# Patient Record
Sex: Female | Born: 1950
Health system: Southern US, Community
[De-identification: ages and names within clinical notes are randomized; demographics above are authoritative.]

## PROBLEM LIST (undated history)

## (undated) DIAGNOSIS — G8929 Other chronic pain: Secondary | ICD-10-CM

## (undated) DIAGNOSIS — J45909 Unspecified asthma, uncomplicated: Secondary | ICD-10-CM

## (undated) DIAGNOSIS — I10 Essential (primary) hypertension: Secondary | ICD-10-CM

## (undated) DIAGNOSIS — K635 Polyp of colon: Secondary | ICD-10-CM

## (undated) DIAGNOSIS — E079 Disorder of thyroid, unspecified: Secondary | ICD-10-CM

## (undated) DIAGNOSIS — F32A Depression, unspecified: Secondary | ICD-10-CM

## (undated) DIAGNOSIS — R519 Headache, unspecified: Secondary | ICD-10-CM

## (undated) DIAGNOSIS — C55 Malignant neoplasm of uterus, part unspecified: Secondary | ICD-10-CM

## (undated) DIAGNOSIS — T7840XA Allergy, unspecified, initial encounter: Secondary | ICD-10-CM

## (undated) DIAGNOSIS — J189 Pneumonia, unspecified organism: Secondary | ICD-10-CM

## (undated) DIAGNOSIS — N200 Calculus of kidney: Secondary | ICD-10-CM

## (undated) DIAGNOSIS — K219 Gastro-esophageal reflux disease without esophagitis: Secondary | ICD-10-CM

## (undated) DIAGNOSIS — F419 Anxiety disorder, unspecified: Secondary | ICD-10-CM

## (undated) DIAGNOSIS — K2270 Barrett's esophagus without dysplasia: Secondary | ICD-10-CM

## (undated) HISTORY — DX: Headache, unspecified: R51.9

## (undated) HISTORY — DX: Gastro-esophageal reflux disease without esophagitis: K21.9

## (undated) HISTORY — DX: Other chronic pain: G89.29

## (undated) HISTORY — DX: Calculus of kidney: N20.0

## (undated) HISTORY — DX: Anxiety disorder, unspecified: F41.9

## (undated) HISTORY — DX: Essential (primary) hypertension: I10

## (undated) HISTORY — DX: Unspecified asthma, uncomplicated: J45.909

## (undated) HISTORY — DX: Depression, unspecified: F32.A

## (undated) HISTORY — DX: Disorder of thyroid, unspecified: E07.9

## (undated) HISTORY — DX: Pneumonia, unspecified organism: J18.9

## (undated) HISTORY — DX: Allergy, unspecified, initial encounter: T78.40XA

## (undated) HISTORY — DX: Malignant neoplasm of uterus, part unspecified: C55

## (undated) HISTORY — DX: Barrett's esophagus without dysplasia: K22.70

## (undated) HISTORY — PX: UMBILICAL HERNIA REPAIR: SHX196

## (undated) HISTORY — DX: Polyp of colon: K63.5

---

## 1978-03-04 DIAGNOSIS — C55 Malignant neoplasm of uterus, part unspecified: Secondary | ICD-10-CM

## 1978-03-04 HISTORY — DX: Malignant neoplasm of uterus, part unspecified: C55

## 1978-03-04 HISTORY — PX: PARTIAL HYSTERECTOMY: SHX80

## 2002-03-04 HISTORY — PX: UMBILICAL HERNIA REPAIR: SHX196

## 2017-03-04 HISTORY — PX: WRIST SURGERY: SHX841

## 2020-03-04 DIAGNOSIS — U071 COVID-19: Secondary | ICD-10-CM

## 2020-03-04 HISTORY — DX: COVID-19: U07.1

## 2020-03-23 DIAGNOSIS — B349 Viral infection, unspecified: Secondary | ICD-10-CM | POA: Diagnosis not present

## 2020-03-23 DIAGNOSIS — U071 COVID-19: Secondary | ICD-10-CM | POA: Diagnosis not present

## 2020-03-24 ENCOUNTER — Encounter: Payer: Self-pay | Admitting: Family Medicine

## 2020-03-29 ENCOUNTER — Encounter: Payer: Self-pay | Admitting: Family Medicine

## 2020-03-29 ENCOUNTER — Telehealth (INDEPENDENT_AMBULATORY_CARE_PROVIDER_SITE_OTHER): Payer: PPO | Admitting: Family Medicine

## 2020-03-29 VITALS — Ht 63.0 in | Wt 153.0 lb

## 2020-03-29 DIAGNOSIS — K2270 Barrett's esophagus without dysplasia: Secondary | ICD-10-CM

## 2020-03-29 DIAGNOSIS — J45909 Unspecified asthma, uncomplicated: Secondary | ICD-10-CM | POA: Insufficient documentation

## 2020-03-29 DIAGNOSIS — F411 Generalized anxiety disorder: Secondary | ICD-10-CM

## 2020-03-29 DIAGNOSIS — I10 Essential (primary) hypertension: Secondary | ICD-10-CM

## 2020-03-29 DIAGNOSIS — J4489 Other specified chronic obstructive pulmonary disease: Secondary | ICD-10-CM | POA: Insufficient documentation

## 2020-03-29 DIAGNOSIS — F419 Anxiety disorder, unspecified: Secondary | ICD-10-CM | POA: Insufficient documentation

## 2020-03-29 DIAGNOSIS — J4520 Mild intermittent asthma, uncomplicated: Secondary | ICD-10-CM

## 2020-03-29 DIAGNOSIS — N200 Calculus of kidney: Secondary | ICD-10-CM | POA: Insufficient documentation

## 2020-03-29 DIAGNOSIS — E039 Hypothyroidism, unspecified: Secondary | ICD-10-CM | POA: Diagnosis not present

## 2020-03-29 DIAGNOSIS — U071 COVID-19: Secondary | ICD-10-CM

## 2020-03-29 MED ORDER — HYDROCOD POLST-CPM POLST ER 10-8 MG/5ML PO SUER: 5.0000 mL | Freq: Two times a day (BID) | ORAL | 0 refills | Status: AC | PRN

## 2020-03-29 MED ORDER — METOPROLOL SUCCINATE ER 25 MG PO TB24
25.0000 mg | ORAL_TABLET | Freq: Every day | ORAL | 0 refills | Status: DC
Start: 2020-03-29 — End: 2020-04-26

## 2020-03-29 MED ORDER — DIAZEPAM 5 MG PO TABS: 2.5000 mg | ORAL_TABLET | Freq: Two times a day (BID) | ORAL | 0 refills | Status: DC

## 2020-03-29 MED ORDER — BUPROPION HCL ER (SR) 150 MG PO TB12: 150.0000 mg | ORAL_TABLET | Freq: Two times a day (BID) | ORAL | 0 refills | Status: DC

## 2020-03-29 MED ORDER — LEVOTHYROXINE SODIUM 88 MCG PO TABS: 88.0000 ug | ORAL_TABLET | Freq: Every day | ORAL | 3 refills | Status: DC

## 2020-03-29 NOTE — Progress Notes (Signed)
[facility] [address] [place] [address] Dept: [phone] Dept Fax: [phone]  Virtual Video Visit  I connected with Stephannie Peters on 03/29/20 at 11:00 AM EST by a video enabled telemedicine application and verified that I am speaking with the correct person using two identifiers.  Location patient: Home Location provider: Clinic Persons participating in the virtual visit: Patient, Provider  I discussed the limitations of evaluation and management by telemedicine and the availability of in person appointments. The patient expressed understanding and agreed to proceed.  Chief Complaint  Patient presents with  . Establish Care    New patient, positive for covid 5 days refill on medications would like referral for colonoscopy.     SUBJECTIVE:  HPI: Kimberly [name] is a 70 y.o. female who presents with to establish care. Additionally, she notes that she tested positive for COVID 5 days ago. She initially had experienced cough and fatigue. However, she is now noting increasing dyspnea, both nwith activity, but also at rest.  Past Medical History Patient Active Problem List   Diagnosis Date Noted  . Barrett's esophagus determined by endoscopy 03/29/2020  . Hypothyroidism 03/29/2020  . Hypertension 03/29/2020  . Kidney stones 03/29/2020  . Asthma 03/29/2020  . Anxiety disorder 03/29/2020   Family History  Problem Relation Age of Onset  . Diabetes Mother   . Cancer Father   . Kidney disease Father   . COPD Brother   . Diabetes Brother   . Depression Son    Social History   Socioeconomic History  . Marital status: Widowed    Spouse name: Richard  . Number of children: 2  . Years of education: 12 years  . Highest education level: High school graduate  Occupational History    Comment: Retired  Tobacco Use  . Smoking status: Former Smoker    Quit date: 1996    Years since quitting: 26.0  . Smokeless  tobacco: Never Used  Vaping Use  . Vaping Use: Never used  Substance and Sexual Activity  . Alcohol use: Yes    Alcohol/week: 1.0 standard drink    Types: 1 Glasses of wine per week    Comment: occ  . Drug use: Never  . Sexual activity: Not on file  Other Topics Concern  . Not on file  Social History Narrative   Recently moved from [place] to [place]. 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   Social Determinants of Health   Financial Resource Strain: Not on file  Food Insecurity: Not on file  Transportation Needs: Not on file  Physical Activity: Not on file  Stress: Not on file  Social Connections: Not on file  Intimate Partner Violence: Not on file    Current Outpatient Medications:  .  albuterol (VENTOLIN HFA) 108 (90 Base) MCG/ACT inhaler, SMARTSIG:2 Puff(s) By Mouth Every 4 Hours PRN, Disp: , Rfl:  .  aspirin 325 MG tablet, Take 81 mg by mouth daily., Disp: , Rfl:  .  cholecalciferol (VITAMIN D3) 25 MCG (1000 UNIT) tablet, , Disp: , Rfl:  .  Cranberry-Vitamin C-Vitamin E (CRANBERRY PLUS VITAMIN C) 4200-20-3 MG-MG-UNIT CAPS, , Disp: , Rfl:  .  famotidine 20 MG/2ML SOLN, , Disp: , Rfl:  .  levothyroxine (SYNTHROID) 88 MCG tablet, Take 1 tablet (88 mcg total) by mouth daily., Disp: 90 tablet, Rfl: 3 .  metoprolol succinate (TOPROL-XL) 25 MG 24 hr tablet, Take 1 tablet (25 mg total) by mouth daily., Disp:  30 tablet, Rfl: 0 .  promethazine-dextromethorphan (PROMETHAZINE-DM) 6.25-15 MG/5ML syrup, , Disp: , Rfl:  .  rOPINIRole (REQUIP) 1 MG tablet, , Disp: , Rfl:  .  buPROPion (WELLBUTRIN SR) 150 MG 12 hr tablet, Take 1 tablet (150 mg total) by mouth 2 (two) times daily., Disp: 60 tablet, Rfl: 0 .  chlorpheniramine-HYDROcodone (TUSSIONEX) 10-8 MG/5ML SUER, Take 5 mLs by mouth every 12 (twelve) hours as needed for cough., Disp: 140 mL, Rfl: 0 .  diazepam (VALIUM) 5 MG tablet, Take 0.5 tablets (2.5 mg total) by mouth in the morning and at bedtime., Disp: 90 tablet,  Rfl: 0  Allergies  Allergen Reactions  . Codeine Hives  . Sulfa Antibiotics Hives and Itching   ROS: See pertinent positives and negatives per HPI.  OBSERVATIONS/OBJECTIVE:  VITALS per patient if applicable: Vitals:   [id number]  Weight: 153 lb (69.4 kg)  Height: 5\' 3"  (1.6 m)     GENERAL: alert, oriented, appears well but with occasional mild dyspnea.  HEENT: atraumatic, conjunctiva clear, no obvious abnormalities on inspection of external nose and ears  NECK: normal movements of the head and neck  LUNGS: on inspection there is intermittent dyspnea with exertion and conversational dyspnea  CV: no obvious cyanosis  MS: moves all visible extremities without noticeable abnormality  PSYCH/NEURO: pleasant and cooperative, no obvious depression or anxiety, speech and thought processing grossly intact  ASSESSMENT AND PLAN:  1. Hypothyroidism, unspecified type Ms. Bridge has been on thyroid replacement for a number of years. Recommend we check her TSH level at her next office visit. - levothyroxine (SYNTHROID) 88 MCG tablet; Take 1 tablet (88 mcg total) by mouth daily.  Dispense: 90 tablet; Refill: 3  2. Barrett's esophagus determined by endoscopy Ms. Velazques notes her last EGD was ~ 2 years ago. Prior to her recent move from [place], she was recommended to have a repeat EGD for surveillance of her Barrett's. We will address this at her next visit, after resolution of her current COVID infection.  3. Hypertension, unspecified type We will need to assess her BP values in the office after resolution of her current COVID infection. - metoprolol succinate (TOPROL-XL) 25 MG 24 hr tablet; Take 1 tablet (25 mg total) by mouth daily.  Dispense: 30 tablet; Refill: 0  4. Mild intermittent asthma, unspecified whether complicated Uses albuterol MDI intermittently when she has flares. Appears to have a viral trigger. Has not needed medicaiton for quite some time until her recent  COVID infection. Follow-up if wheezing worsens.  5. Generalized anxiety disorder Ms. Sanda has been stable on Valium long-term. She notes there was an attempt to stop this last Jan., but her anxiety and panic feelings worsened. We will continue her current meds.  - buPROPion (WELLBUTRIN SR) 150 MG 12 hr tablet; Take 1 tablet (150 mg total) by mouth 2 (two) times daily.  Dispense: 60 tablet; Refill: 0 - diazepam (VALIUM) 5 MG tablet; Take 0.5 tablets (2.5 mg total) by mouth in the morning and at bedtime.  Dispense: 90 tablet; Refill: 0  6. COVID-19 Ms. Minneci has recently tested positive for COVID. She is demonstrating mild to possibly moderate dyspnea. I recommended if she sees any worsenign of this, she should seek urgent or emergent care for assessment and to discuss possible antiviral treatment.  - chlorpheniramine-HYDROcodone (Maynard) 10-8 MG/5ML SUER; Take 5 mLs by mouth every 12 (twelve) hours as needed for cough.  Dispense: 140 mL; Refill: 0  We did discuss current immunization status. She  has a needle aversion. She has not received an annual influenza vaccine, zoster vaccine, or pneumococcal vaccine. She is not currently willing to receive these.   I discussed the assessment and treatment plan with the patient. The patient was provided an opportunity to ask questions and all were answered. The patient agreed with the plan and demonstrated an understanding of the instructions.   The patient was advised to call back or seek an in-person evaluation if the symptoms worsen or if the condition fails to improve as anticipated. Patient was asked to plan an in-person visit once her COVID infection has resolved.  [name], MD

## 2020-03-31 ENCOUNTER — Ambulatory Visit: Payer: Self-pay | Admitting: Family Medicine

## 2020-04-02 ENCOUNTER — Encounter: Payer: Self-pay | Admitting: Family Medicine

## 2020-04-02 DIAGNOSIS — U071 COVID-19: Secondary | ICD-10-CM

## 2020-04-03 DIAGNOSIS — U071 COVID-19: Secondary | ICD-10-CM | POA: Diagnosis not present

## 2020-04-03 DIAGNOSIS — R051 Acute cough: Secondary | ICD-10-CM | POA: Diagnosis not present

## 2020-04-03 DIAGNOSIS — J019 Acute sinusitis, unspecified: Secondary | ICD-10-CM | POA: Diagnosis not present

## 2020-04-03 MED ORDER — PROMETHAZINE-DM 6.25-15 MG/5ML PO SYRP: 2.5000 mL | ORAL_SOLUTION | Freq: Four times a day (QID) | ORAL | 0 refills | Status: AC | PRN

## 2020-04-11 ENCOUNTER — Encounter: Payer: Self-pay | Admitting: Family Medicine

## 2020-04-24 DIAGNOSIS — H524 Presbyopia: Secondary | ICD-10-CM | POA: Diagnosis not present

## 2020-04-24 DIAGNOSIS — H5203 Hypermetropia, bilateral: Secondary | ICD-10-CM | POA: Diagnosis not present

## 2020-04-24 DIAGNOSIS — H52203 Unspecified astigmatism, bilateral: Secondary | ICD-10-CM | POA: Diagnosis not present

## 2020-04-24 DIAGNOSIS — H2513 Age-related nuclear cataract, bilateral: Secondary | ICD-10-CM | POA: Diagnosis not present

## 2020-04-26 ENCOUNTER — Other Ambulatory Visit: Payer: Self-pay | Admitting: Family Medicine

## 2020-04-26 DIAGNOSIS — I10 Essential (primary) hypertension: Secondary | ICD-10-CM

## 2020-04-26 DIAGNOSIS — F411 Generalized anxiety disorder: Secondary | ICD-10-CM

## 2020-04-27 ENCOUNTER — Encounter: Payer: Self-pay | Admitting: Family Medicine

## 2020-04-28 MED ORDER — ROPINIROLE HCL 1 MG PO TABS: 1.0000 mg | ORAL_TABLET | Freq: Every day | ORAL | 3 refills | Status: DC

## 2020-04-28 NOTE — Telephone Encounter (Signed)
Please see message and advise.  Thank you. Ok, to fill?

## 2020-05-17 ENCOUNTER — Encounter: Payer: Self-pay | Admitting: Family Medicine

## 2020-05-17 ENCOUNTER — Ambulatory Visit (INDEPENDENT_AMBULATORY_CARE_PROVIDER_SITE_OTHER): Payer: PPO | Admitting: Family Medicine

## 2020-05-17 ENCOUNTER — Other Ambulatory Visit: Payer: Self-pay

## 2020-05-17 VITALS — BP 136/80 | HR 68 | Temp 97.3°F | Ht 63.0 in | Wt 169.0 lb

## 2020-05-17 DIAGNOSIS — Z1211 Encounter for screening for malignant neoplasm of colon: Secondary | ICD-10-CM | POA: Diagnosis not present

## 2020-05-17 DIAGNOSIS — I10 Essential (primary) hypertension: Secondary | ICD-10-CM

## 2020-05-17 DIAGNOSIS — N8110 Cystocele, unspecified: Secondary | ICD-10-CM | POA: Diagnosis not present

## 2020-05-17 DIAGNOSIS — E039 Hypothyroidism, unspecified: Secondary | ICD-10-CM | POA: Diagnosis not present

## 2020-05-17 DIAGNOSIS — K2270 Barrett's esophagus without dysplasia: Secondary | ICD-10-CM

## 2020-05-17 DIAGNOSIS — Z13820 Encounter for screening for osteoporosis: Secondary | ICD-10-CM

## 2020-05-17 DIAGNOSIS — J4520 Mild intermittent asthma, uncomplicated: Secondary | ICD-10-CM

## 2020-05-17 LAB — TSH: TSH: 4.41 u[IU]/mL (ref 0.35–4.50)

## 2020-05-17 NOTE — Progress Notes (Signed)
[facility] [address] [place] [address] Dept: [phone] Dept Fax: [phone]  Chronic Care Office Visit  Subjective:    Patient ID: Kimberly [name], female    DOB: 1950-10-01, 70 y.o..   MRN: [id number]  Chief Complaint  Patient presents with  . Establish Care    NP-  establish care. Needs to get colonoscopy and endoscopy,  OB-GYN (feels something hanging out).      History of Present Illness:  Patient is in today for reassessment of chronic medical issues. She was seen for an initial visit to establish care in Jan. 2021, but had COVID-19 infection at the time, so this will be her first in-person exam. Ms. Handyside had moved to [place] in the past year from [place]. She is a widow.  Ms. Kriegel notes that she has continued to need her albuterol inhaler at times due to wheezing. She had a childhood history of asthmatic bronchitis. She had been off of any inhaler for many years, up until her recent COVID-19 infection. In the past 6-7 weeks, she has used the inhaler 63 times. She notes that she tries not to use it unless absolutely necessary. She has some fears of this, as she had a friend who died from what she felt was inhaler abuse.  Ms. Haslem has a history of hypothyroidism. She notes her levothyroxine dose was lowered at one point to 0.075 mg, but that this proved to be too low a dose. She is currently on 0.088 mg dose.  Ms. Scroggins has a history of having had a hysterectomy many years ago, for what she describes as cancer. She apparently retained her ovaries. She notes that for some years now, she has had prolapse of tissue from the vagina. She had been having some incontinence, but now she notes the vaginal protrusion is greater and this may have reduced the incontinence.  Ms. Inda has a history of Barrett's esophagus and is due for a follow-up EGD. She also notes she is due for her colonoscopy and hopes  she can get both on the same day.  Ms. Noorani has a history of hypertension. She is currently on metoprolol for this.  Past Medical History: Patient Active Problem List   Diagnosis Date Noted  . Barrett's esophagus determined by endoscopy 03/29/2020  . Hypothyroidism 03/29/2020  . Essential hypertension 03/29/2020  . Kidney stones 03/29/2020  . Asthma 03/29/2020  . Anxiety disorder 03/29/2020   Past Surgical History:  Procedure Laterality Date  . ABDOMINAL HYSTERECTOMY  1980   Pre-cancerous condition. Failed laser surgery.  Marland Kitchen HERNIA REPAIR    . WRIST SURGERY Left 2019   Family History  Problem Relation Age of Onset  . Diabetes Mother   . Cancer Father   . Kidney disease Father   . COPD Brother   . Diabetes Brother   . Depression Son    Outpatient Medications Prior to Visit  Medication Sig Dispense Refill  . albuterol (VENTOLIN HFA) 108 (90 Base) MCG/ACT inhaler SMARTSIG:2 Puff(s) By Mouth Every 4 Hours PRN    . aspirin 325 MG tablet Take 81 mg by mouth daily.    Marland Kitchen buPROPion (WELLBUTRIN SR) 150 MG 12 hr tablet TAKE 1 TABLET(150 MG) BY MOUTH TWICE DAILY 60 tablet 0  . cholecalciferol (VITAMIN D3) 25 MCG (1000 UNIT) tablet     . Cranberry-Vitamin C-Vitamin E (CRANBERRY PLUS VITAMIN C) 4200-20-3 MG-MG-UNIT CAPS     . diazepam (VALIUM) 5 MG tablet Take  0.5 tablets (2.5 mg total) by mouth in the morning and at bedtime. 90 tablet 0  . famotidine 20 MG/2ML SOLN     . levothyroxine (SYNTHROID) 88 MCG tablet Take 1 tablet (88 mcg total) by mouth daily. 90 tablet 3  . metoprolol succinate (TOPROL-XL) 25 MG 24 hr tablet TAKE 1 TABLET(25 MG) BY MOUTH DAILY 30 tablet 0  . pseudoephedrine-guaifenesin (TUSSIN PE) 30-100 MG/5ML SYRP Take 5 mLs by mouth every 4 (four) hours as needed.    Marland Kitchen rOPINIRole (REQUIP) 1 MG tablet Take 1 tablet (1 mg total) by mouth at bedtime. 90 tablet 3   No facility-administered medications prior to visit.   Allergies  Allergen Reactions  . Codeine Hives   . Sulfa Antibiotics Hives and Itching    Objective:   Today's Vitals   05/17/20 0835  BP: 136/80  Pulse: 68  Temp: (!) 97.3 F (36.3 C)  TempSrc: Temporal  SpO2: 96%  Weight: 169 lb (76.7 kg)  Height: 5\' 3"  (1.6 m)   Body mass index is 29.94 kg/m.   General: Well developed, well nourished. No acute distress. Lungs: Clear to auscultation bilaterally. CV: RRR without murmurs or rubs. Pulses 2+ bilaterally. Psych: Alert and oriented. Normal mood and affect.  Health Maintenance Due  Topic Date Due  . Hepatitis C Screening  Never done  . COLONOSCOPY (Pts 45-36yrs Insurance coverage will need to be confirmed)  Never done  . MAMMOGRAM  Never done  . DEXA SCAN  Never done  . PNA vac Low Risk Adult (1 of 2 - PCV13) Never done  . COVID-19 Vaccine (3 - Booster for Pfizer series) 12/13/2019      Assessment & Plan:   1. Essential hypertension Blood pressure is adequately controlled on metoprolol.  2. Mild intermittent asthma, unspecified whether complicated Ms. Lizak's lungs are clear today. She appears to still have some mild, intermittent asthma. We discussed her continuing use of her albuterol inhaler and we will observe for expected gradual resolution of her wheezing.  3. Barrett's esophagus determined by endoscopy  - Ambulatory referral to Gastroenterology  4. Hypothyroidism, unspecified type Due for monitoring of TSH.  - TSH  5. Vaginal prolapse Ms. Warga describes vaginal prolapse with a possible cystocele. I will refer her to a GYN for evaluation.  - Ambulatory referral to Gynecology  6. Screening for colon cancer  - Ambulatory referral to Gastroenterology  7. Screening for osteoporosis  - DG Bone Density; Future  [name], MD

## 2020-05-25 ENCOUNTER — Encounter: Payer: Self-pay | Admitting: Family Medicine

## 2020-05-25 ENCOUNTER — Other Ambulatory Visit: Payer: Self-pay | Admitting: Family Medicine

## 2020-05-25 DIAGNOSIS — F411 Generalized anxiety disorder: Secondary | ICD-10-CM

## 2020-05-25 DIAGNOSIS — I10 Essential (primary) hypertension: Secondary | ICD-10-CM

## 2020-05-25 MED ORDER — DIAZEPAM 5 MG PO TABS: 2.5000 mg | ORAL_TABLET | Freq: Two times a day (BID) | ORAL | 0 refills | Status: DC

## 2020-05-29 ENCOUNTER — Encounter: Payer: Self-pay | Admitting: Nurse Practitioner

## 2020-05-29 ENCOUNTER — Encounter: Payer: Self-pay | Admitting: Family Medicine

## 2020-05-30 ENCOUNTER — Other Ambulatory Visit: Payer: Self-pay

## 2020-05-30 ENCOUNTER — Ambulatory Visit: Payer: PPO | Admitting: Obstetrics and Gynecology

## 2020-05-30 ENCOUNTER — Encounter: Payer: Self-pay | Admitting: Obstetrics and Gynecology

## 2020-05-30 VITALS — BP 160/92 | Ht 62.0 in | Wt 169.0 lb

## 2020-05-30 DIAGNOSIS — R39198 Other difficulties with micturition: Secondary | ICD-10-CM

## 2020-05-30 DIAGNOSIS — Z1239 Encounter for other screening for malignant neoplasm of breast: Secondary | ICD-10-CM

## 2020-05-30 DIAGNOSIS — K5900 Constipation, unspecified: Secondary | ICD-10-CM | POA: Diagnosis not present

## 2020-05-30 DIAGNOSIS — N816 Rectocele: Secondary | ICD-10-CM | POA: Diagnosis not present

## 2020-05-30 DIAGNOSIS — Z01411 Encounter for gynecological examination (general) (routine) with abnormal findings: Secondary | ICD-10-CM | POA: Diagnosis not present

## 2020-05-30 NOTE — Patient Instructions (Signed)
Pelvic Organ Prolapse Pelvic organ prolapse is a condition in women that involves the stretching, bulging, or dropping of pelvic organs into an abnormal position, past the opening of the vagina. It happens when the muscles and tissues that surround and support pelvic structures become weak or stretched. Pelvic organ prolapse can involve the:  Vagina (vaginal prolapse).  Uterus (uterine prolapse).  Bladder (cystocele).  Rectum (rectocele).  Intestines (enterocele). When organs other than the vagina are involved, they often bulge into the vagina or protrude from the vagina, depending on how severe the prolapse is. What are the causes? This condition may be caused by:  Pregnancy, labor, and childbirth.  Past pelvic surgery.  Lower levels of the hormone estrogen due to menopause.  Consistently lifting more than 50 lb (23 kg).  Obesity.  Long-term difficulty passing stool (chronic constipation).  Long-term, or chronic, cough.  Fluid buildup in the abdomen due to certain conditions. What are the signs or symptoms? Symptoms of this condition include:  Leaking a little urine (loss of bladder control) when you cough, sneeze, strain, and exercise (stress incontinence). This may be worse immediately after childbirth. It may gradually improve over time.  Feeling pressure in your pelvis or vagina. This pressure may increase when you cough or when you are passing stool.  A bulge that protrudes from the opening of your vagina.  Difficulty passing urine or stool.  Pain in your lower back.  Pain or discomfort during sex, or decreased interest in sex.  Repeated bladder infections (urinary tract infections).  Difficulty inserting a tampon. In some people, this condition causes no symptoms. How is this diagnosed? This condition may be diagnosed based on a vaginal and rectal exam. During the exam, you may be asked to cough and strain while you are lying down, sitting, and standing up.  Your health care provider will determine if other tests are required, such as bladder function tests. How is this treated? Treatment for this condition may depend on your symptoms. Treatment may include:  Lifestyle changes, such as drinking plenty of fluids and eating foods that are high in fiber.  Emptying your bladder at scheduled times (bladder training therapy). This can help reduce or avoid urinary incontinence.  Estrogen. This may help mild prolapse by increasing the strength and tone of pelvic floor muscles.  Kegel exercises. These may help mild cases of prolapse by strengthening and tightening the muscles of the pelvic floor.  A soft, flexible device that helps support the vaginal walls and keep pelvic organs in place (pessary). This is inserted into your vagina by your health care provider.  Surgery. This is often the only form of treatment for severe prolapse. Follow these instructions at home: Eating and drinking  Avoid drinking beverages that contain caffeine or alcohol.  Increase your intake of high-fiber foods to decrease constipation and straining during bowel movements. Activity  Lose weight if recommended by your health care provider.  Avoid heavy lifting and straining with exercise and work. Do not hold your breath when you perform mild to moderate lifting and exercise activities. Limit your activities as directed by your health care provider.  Do Kegel exercises as directed by your health care provider. To do this: ? Squeeze your pelvic floor muscles tight. You should feel a tight lift in your rectal area and a tightness in your vaginal area. Keep your stomach, buttocks, and legs relaxed. ? Hold the muscles tight for up to 10 seconds. Then relax your muscles. ? Repeat this exercise  50 times a day, or as much as told by your health care provider. Continue to do this exercise for at least 4-6 weeks, or for as long as told by your health care provider. General  instructions  Take over-the-counter and prescription medicines only as told by your health care provider.  Wear a sanitary pad or adult diapers if you have urinary incontinence.  If you have a pessary, take care of it as told by your health care provider.  Keep all follow-up visits. This is important. Contact a health care provider if you:  Have symptoms that interfere with your daily activities or sex life.  Need medicine to help with the discomfort.  Notice bleeding from your vagina that is not related to your menstrual period.  Have a fever.  Have pain or bleeding when you urinate.  Have bleeding when you pass stool.  Pass urine when you have sex.  Have chronic constipation.  Have a pessary that falls out.  Have a foul-smelling vaginal discharge.  Have an unusual, low pain in your abdomen. Get help right away if you:  Cannot pass urine. Summary  Pelvic organ prolapse is the stretching, bulging, or dropping of pelvic organs into an abnormal position. It happens when the muscles and tissues that surround and support pelvic structures become weak or stretched.  When organs other than the vagina are involved, they often bulge into the vagina or protrude from it, depending on how severe the prolapse is.  In most cases, this condition needs to be treated only if it produces symptoms. Treatment may include lifestyle changes, estrogen, Kegel exercises, pessary insertion, or surgery.  Avoid heavy lifting and straining with exercise and work. Do not hold your breath when you perform mild to moderate lifting and exercise activities. Limit your activities as directed by your health care provider. This information is not intended to replace advice given to you by your health care provider. Make sure you discuss any questions you have with your health care provider. Document Revised: 08/16/2019 Document Reviewed: 08/16/2019 Elsevier Patient Education  [place].

## 2020-05-30 NOTE — Progress Notes (Signed)
70 y.o. G2P2 Widowed Caucasian female here for breast and pelvic exam and for pelvic organ prolapse post hysterectomy.   Prefers not be called a "widow."  States her vagina is hanging out. Difficult to void and to have bowel movements.  Saw specialist many years ago.   No history of recent UTIs.  Last UTI was 2 -3 years ago.   Had urinary incontinence but not occurring as frequently now.  Used to leak with cough and laugh.  Not sure if she leaked for no reason at all.   No splinting to have BMs.  Has some fecal soiling.  Having change in color of her bowel movements.  Uses Miralax and needs to do a lot of wiping with BMs.  Is anticipating an endoscopy and colonoscopy.  Received her Covid vaccine.  Had Covid in January, 2022.  PCP:   Arlester Marker, MD  No LMP recorded. Patient has had a hysterectomy.           Sexually active: No.  The current method of family planning is status post hysterectomy.    Exercising: No.  The patient does not participate in regular exercise at present. Smoker:  Former  Health Maintenance: Pap: 2000 normal History of abnormal Pap:  no MMG:  06/2019 normal in [place] Colonoscopy: Scheduled next month BMD:  Unsure  Result :Normal TDaP: Unsure--has fear of needles Gardasil:   no [id number] Hep C: unsure Screening Labs:     reports that she quit smoking about 26 years ago. She has never used smokeless tobacco. She reports current alcohol use of about 3.0 standard drinks of alcohol per week. She reports that she does not use drugs.  Past Medical History:  Diagnosis Date  . Anxiety   . Asthma   . Barrett esophagus   . COVID 03/2020  . Hypertension   . Kidney stones   . Thyroid disease     Past Surgical History:  Procedure Laterality Date  . ABDOMINAL HYSTERECTOMY  1980   Pre-cancerous condition. Failed laser surgery.  Marland Kitchen HERNIA REPAIR    . WRIST SURGERY Left 2019    Current Outpatient Medications  Medication Sig Dispense Refill   . albuterol (VENTOLIN HFA) 108 (90 Base) MCG/ACT inhaler SMARTSIG:2 Puff(s) By Mouth Every 4 Hours PRN    . aspirin 325 MG tablet Take 81 mg by mouth daily.    Marland Kitchen buPROPion (WELLBUTRIN SR) 150 MG 12 hr tablet TAKE 1 TABLET(150 MG) BY MOUTH TWICE DAILY 180 tablet 3  . cholecalciferol (VITAMIN D3) 25 MCG (1000 UNIT) tablet     . Cranberry-Vitamin C-Vitamin E (CRANBERRY PLUS VITAMIN C) 4200-20-3 MG-MG-UNIT CAPS     . diazepam (VALIUM) 5 MG tablet Take 0.5 tablets (2.5 mg total) by mouth in the morning and at bedtime. 90 tablet 0  . famotidine 20 MG/2ML SOLN     . levothyroxine (SYNTHROID) 88 MCG tablet Take 1 tablet (88 mcg total) by mouth daily. 90 tablet 3  . metoprolol succinate (TOPROL-XL) 25 MG 24 hr tablet TAKE 1 TABLET(25 MG) BY MOUTH DAILY 30 tablet 0  . promethazine (PHENERGAN) 25 MG tablet Take 25 mg by mouth every 6 (six) hours as needed for nausea or vomiting.    . pseudoephedrine-guaifenesin (TUSSIN PE) 30-100 MG/5ML SYRP Take 5 mLs by mouth every 4 (four) hours as needed.    Marland Kitchen rOPINIRole (REQUIP) 1 MG tablet Take 1 tablet (1 mg total) by mouth at bedtime. 90 tablet 3   No current facility-administered medications for  this visit.    Family History  Problem Relation Age of Onset  . Diabetes Mother   . Breast cancer Mother   . Cancer Father        colon cancer  . Kidney disease Father   . COPD Brother   . Diabetes Brother   . Depression Son     Review of Systems  All other systems reviewed and are negative.   Exam:   BP (!) 160/92   Ht 5\' 2"  (1.575 m)   Wt 169 lb (76.7 kg)   BMI 30.91 kg/m     General appearance: alert, cooperative and appears stated age Head: normocephalic, without obvious abnormality, atraumatic Neck: no adenopathy, supple, symmetrical, trachea midline and thyroid normal to inspection and palpation Lungs: clear to auscultation bilaterally Breasts: normal appearance, no masses or tenderness, No nipple retraction or dimpling, No nipple discharge or  bleeding, No axillary adenopathy Heart: regular rate and rhythm Abdomen: soft, non-tender; no masses, no organomegaly Extremities: extremities normal, atraumatic, no cyanosis or edema Skin: skin color, texture, turgor normal. No rashes or lesions Lymph nodes: cervical, supraclavicular, and axillary nodes normal. Neurologic: grossly normal  Pelvic: External genitalia:  no lesions              No abnormal inguinal nodes palpated.              Urethra:  normal appearing urethra with no masses, tenderness or lesions              Bartholins and Skenes: normal                 Vagina: normal appearing vagina with normal color and discharge, no lesions.  First degree cystocele and vaginal vault prolapse, second degree rectocele.               Cervix: absent              Pap taken: No. Bimanual Exam:  Uterus: absent              Adnexa: no mass, fullness, tenderness              Rectal exam: Yes.  .  Confirms.              Anus:  normal sphincter tone, no lesions  Chaperone was present for exam.  Assessment:    Screening breast exam. Pelvic exam with abnormal findings. Status post TVH. Ovaries remain. Pelvic organ prolapse.  Rectocele.  Difficulty voiding.  Constipation.  FH breast cancer in mother.  FH of colon cancer in father. Barrett's esophagus.   Plan: Mammogram screening discussed. Self breast awareness reviewed. Patient wishes to proceed with her endoscopy and colonoscopy prior to pessary fitting.  Return for pessary fitting.  Needs to be re-examined at pessary fitting.  Would need urodynamic testing if proceeds with surgical care.  Switch from Miralax to Metamucil and try colace.  Increase fruits, vegetables and increase H2O intake.  She will see GI.  Follow up annually and prn.

## 2020-05-31 ENCOUNTER — Encounter: Payer: Self-pay | Admitting: Family Medicine

## 2020-05-31 DIAGNOSIS — N816 Rectocele: Secondary | ICD-10-CM | POA: Insufficient documentation

## 2020-06-05 ENCOUNTER — Encounter: Payer: Self-pay | Admitting: Family Medicine

## 2020-06-05 DIAGNOSIS — G2581 Restless legs syndrome: Secondary | ICD-10-CM | POA: Insufficient documentation

## 2020-06-05 DIAGNOSIS — J4520 Mild intermittent asthma, uncomplicated: Secondary | ICD-10-CM

## 2020-06-05 MED ORDER — ALBUTEROL SULFATE HFA 108 (90 BASE) MCG/ACT IN AERS: INHALATION_SPRAY | RESPIRATORY_TRACT | 3 refills | Status: DC

## 2020-06-13 NOTE — Progress Notes (Signed)
06/13/2020 Kimberly [name] [id number] January 29, 1951   CHIEF COMPLAINT: Schedule and EGD and colonoscopy   HISTORY OF PRESENT ILLNESS:  Kimberly [name] is a 70 year old female with a past medical history of anxiety, asthma, hypertension "white coat syndrome", hypothyroidism, kidney stones, Covid 19 03/2019, Barrett's esophagus. Past partial hysterectomy. She presents to our office today as referred by Dr. Gena Fray to schedule a surveillance EGD and screening colonoscopy.   She complains of intermittent burping.  He has heartburn once or twice yearly.  No dysphagia.  She sometimes feels as if there is a lump to the left side of her throat without associated fever or throat soreness which has been present for the past 4 days.  No odynophagia.  She is taking Famotidine 20 mg p.o. twice daily.  She took Nexium and Prilosec in the past but stated these medications did not settle well with her therefore she was on Zantac for her GERD and Barrett's esophagus.  When Zantac was taken off the market, she was transitioned to Famotidine 20 mg p.o. twice daily which she continues to take at this time.  She takes aspirin 81 mg daily.  No other NSAID use.  Her most recent EGD was done 01/05/2018 by Dr. Helane Gunther identified Barrett's esophagus to the lower third of the esophagus without dysplasia.  A Brav[place] study was done at that time, results/records not available at this time. An EGD was done 12/26/2015 which identified  Barrett's esophagus, a normal stomach without evidence of H. Pylori and duodenal biopsies were negative for celiac disease. A colonoscopy was on the same date which showed diverticulosis to hte sigmoid colon and 4 hyperplastic polyps ( less than 34mm in size)  were removed from the rectum. She has chronic constipation.  She can go for days without passing a bowel movement.  She often strains pass a BM.  Taking MiraLAX resulted in messy stools.  She eats fried food when she is constipated which triggers  multiple bowel movements.  She has vaginal prolapse and her gynecologist recommended Metamucil to avoid straining but she has not initiated this yet.  She reported her father had a history of colon polyps and possibly had colon cancer, further details are unclear. She has a history of asthma which is somewhat worsened with more shortness of breath after she had Covid 19 infection 03/2019.  At that time, she is using her albuterol once or twice daily but feels her asthma is controlled at this point.  She wishes to schedule and EGD and colonoscopy at this time. No other complaints at this time  Labs 05/2020 (patient showed lab results from her telephone) WBC 5.8. Hg 12.9. HCT 39.8. PLT 189. BUN 0.2. Cr 0.66. AST 18. ALT 18.   Past Medical History:  Diagnosis Date  . Anxiety   . Asthma   . Barrett esophagus   . COVID 03/2020  . Hypertension   . Kidney stones   . Thyroid disease    Past Surgical History:  Procedure Laterality Date  . ABDOMINAL HYSTERECTOMY  1980   Pre-cancerous condition. Failed laser surgery.  Marland Kitchen HERNIA REPAIR    . WRIST SURGERY Left 2019   Social History: She is single.  She has 1 son and 1 daughter.  Smoked cigarettes in the past, quit 1995. She drinks one beer daily or rum drink once weekly.   Family History: Mother deceased age 77 with history of breast cancer and diabetes. Father deceased age 50 history of  colon polyps, possibly had colon cancer. Brother COPD. Son with diabetes.    Allergies  Allergen Reactions  . Codeine Hives  . Sulfa Antibiotics Hives and Itching      Outpatient Encounter Medications as of 06/14/2020  Medication Sig  . albuterol (VENTOLIN HFA) 108 (90 Base) MCG/ACT inhaler SMARTSIG:2 Puff(s) By Mouth Every 4 Hours PRN  . aspirin 325 MG tablet Take 81 mg by mouth daily.  Marland Kitchen buPROPion (WELLBUTRIN SR) 150 MG 12 hr tablet TAKE 1 TABLET(150 MG) BY MOUTH TWICE DAILY  . cholecalciferol (VITAMIN D3) 25 MCG (1000 UNIT) tablet   . Cranberry-Vitamin  C-Vitamin E (CRANBERRY PLUS VITAMIN C) 4200-20-3 MG-MG-UNIT CAPS   . diazepam (VALIUM) 5 MG tablet Take 0.5 tablets (2.5 mg total) by mouth in the morning and at bedtime.  . famotidine 20 MG/2ML SOLN   . levothyroxine (SYNTHROID) 88 MCG tablet Take 1 tablet (88 mcg total) by mouth daily.  . metoprolol succinate (TOPROL-XL) 25 MG 24 hr tablet TAKE 1 TABLET(25 MG) BY MOUTH DAILY  . promethazine (PHENERGAN) 25 MG tablet Take 25 mg by mouth every 6 (six) hours as needed for nausea or vomiting.  . pseudoephedrine-guaifenesin (TUSSIN PE) 30-100 MG/5ML SYRP Take 5 mLs by mouth every 4 (four) hours as needed.  Marland Kitchen rOPINIRole (REQUIP) 1 MG tablet Take 1 tablet (1 mg total) by mouth at bedtime.   No facility-administered encounter medications on file as of 06/14/2020.     REVIEW OF SYSTEMS:  Gen: Denies fever, sweats or chills. No weight loss.  CV: Denies chest pain, palpitations or edema. Resp: Denies cough, shortness of breath of hemoptysis.  GI: See HPI. GU : Denies urinary burning, blood in urine, increased urinary frequency or incontinence. MS: Denies joint pain, muscles aches or weakness. Derm: Denies rash, itchiness, skin lesions or unhealing ulcers. Psych: + Anxiety and depression.  Heme: Denies bruising, bleeding. Neuro:  + Headaches.  Endo:  Denies any problems with DM, thyroid or adrenal function.  PHYSICAL EXAM: BP 130/80 (BP Location: Left Arm, Patient Position: Sitting, Cuff Size: Normal)   Pulse 76   Ht 5' 1.75" (1.568 m) Comment: height measured without shoes  Wt 168 lb (76.2 kg)   BMI 30.98 kg/m   General: Well developed  70 year old female in no acute distress. Head: Normocephalic and atraumatic. Eyes:  Sclerae non-icteric, conjunctive pink. Ears: Normal auditory acuity. Mouth: Upper bridge.  No ulcers or lesions.  Neck: Supple, no lymphadenopathy or thyromegaly.  Lungs: Clear bilaterally to auscultation without wheezes, crackles or rhonchi. Heart: Regular rate and  rhythm. No murmur, rub or gallop appreciated.  Abdomen: Soft, nontender, non distended. No masses. No hepatosplenomegaly. Normoactive bowel sounds x 4 quadrants.  Rectal: Deferred/  Musculoskeletal: Symmetrical with no gross deformities. Skin: Warm and dry. No rash or lesions on visible extremities. Extremities: No edema. Neurological: Alert oriented x 4, no focal deficits.  Psychological:  Alert and cooperative. Normal mood and affect.  ASSESSMENT AND PLAN:  1. Barrett's esophagus. Last EGD 01/05/2018  done in [place], [place] EGD benefits and risks discussed including risk with sedation, risk of bleeding, perforation and infection. If Barrett's esophagus stable may consider loner interval between surveillance EGDs  ie : every 3 to 5 years -Continue Famotidine 20mg  po bid, discussed trial with PPI if repeat EGD continues to identify evidence of Barrett's esophagus   2. History of hyperplastic rectal polyps per colonoscopy 12/2015. Father with history of colon polyps, questionable colon cancer.  -Colonoscopy benefits and risks  discussed including risk with sedation, risk of bleeding, perforation and infection   3. Chronic constipation -Metamucil daily -Colace stool softener 100mg  Q HS as tolerated   Further recommendations to be determined after EGD and colonoscopy completed           CC:  Rudd, Lillette Boxer, MD

## 2020-06-14 ENCOUNTER — Encounter: Payer: Self-pay | Admitting: Nurse Practitioner

## 2020-06-14 ENCOUNTER — Ambulatory Visit: Payer: PPO | Admitting: Nurse Practitioner

## 2020-06-14 VITALS — BP 130/80 | HR 76 | Ht 61.75 in | Wt 168.0 lb

## 2020-06-14 DIAGNOSIS — Z86010 Personal history of colonic polyps: Secondary | ICD-10-CM | POA: Diagnosis not present

## 2020-06-14 DIAGNOSIS — K219 Gastro-esophageal reflux disease without esophagitis: Secondary | ICD-10-CM | POA: Diagnosis not present

## 2020-06-14 DIAGNOSIS — K5900 Constipation, unspecified: Secondary | ICD-10-CM

## 2020-06-14 DIAGNOSIS — K2270 Barrett's esophagus without dysplasia: Secondary | ICD-10-CM

## 2020-06-14 MED ORDER — SUPREP BOWEL PREP KIT 17.5-3.13-1.6 GM/177ML PO SOLN: 1.0000 | ORAL | 0 refills | Status: DC

## 2020-06-14 NOTE — Patient Instructions (Addendum)
If you are age 69 or older, your body mass index should be between 23-30. Your Body mass index is 30.98 kg/m. If this is out of the aforementioned range listed, please consider follow up with your Primary Care Provider.   PROCEDURES: You have been scheduled for an endoscopy and colonoscopy. Please follow the written instructions given to you at your visit today. Please pick up your prep supplies at the pharmacy within the next 1-3 days. If you use inhalers (even only as needed), please bring them with you on the day of your procedure.  OVER THE COUNTER MEDICATION Please purchase the following medications over the counter and take as directed:  Please purchase Metamucil over the counter. Take as directed. Colace 100 MG at bedtime as needed.  It was great seeing you today! Thank you for entrusting me with your care and choosing [facility].  Noralyn Pick, CRNP

## 2020-06-15 NOTE — Progress Notes (Signed)
Noted  

## 2020-06-16 ENCOUNTER — Encounter: Payer: Self-pay | Admitting: Family Medicine

## 2020-06-19 NOTE — Telephone Encounter (Signed)
Spoke to patient and scheduled her an VV at 11:30 am 06/19/20.  Dm/cma

## 2020-06-20 ENCOUNTER — Encounter: Payer: Self-pay | Admitting: Family Medicine

## 2020-06-20 ENCOUNTER — Telehealth (INDEPENDENT_AMBULATORY_CARE_PROVIDER_SITE_OTHER): Payer: PPO | Admitting: Family Medicine

## 2020-06-20 VITALS — Ht 61.75 in | Wt 169.0 lb

## 2020-06-20 DIAGNOSIS — J4521 Mild intermittent asthma with (acute) exacerbation: Secondary | ICD-10-CM | POA: Diagnosis not present

## 2020-06-20 DIAGNOSIS — J40 Bronchitis, not specified as acute or chronic: Secondary | ICD-10-CM

## 2020-06-20 MED ORDER — HYDROCODONE-HOMATROPINE 5-1.5 MG/5ML PO SYRP: 5.0000 mL | ORAL_SOLUTION | Freq: Three times a day (TID) | ORAL | 0 refills | Status: DC | PRN

## 2020-06-20 MED ORDER — PREDNISONE 20 MG PO TABS: 20.0000 mg | ORAL_TABLET | Freq: Every day | ORAL | 0 refills | Status: DC

## 2020-06-20 NOTE — Progress Notes (Signed)
[facility] [address] [place] [address] Dept: [phone] Dept Fax: [phone]  Virtual Video Visit  I connected with Stephannie Peters on 06/20/20 at 11:30 AM EDT by a video enabled telemedicine application and verified that I am speaking with the correct person using two identifiers.  Location patient: Home Location provider: Clinic Persons participating in the virtual visit: Patient, Provider  I discussed the limitations of evaluation and management by telemedicine and the availability of in person appointments. The patient expressed understanding and agreed to proceed.  Chief Complaint  Patient presents with  . Acute Visit    C/o having chest congestion, cough x 4-5 days.  She has been taking Tylenol, promethazine and pseudoephedrine-guafi       SUBJECTIVE:  HPI: Kimberly [name] is a 70 y.o. female who presents with a 4-day history of chest congestion and cough. This started the day after she had been out for a ride in her convertible with the top down. She notes the cough has been pretty severe. She has coughed enough that she has some pain in her right flank area. She believes she has bronchitis. She has been coughing up greenish-brown sputum. She isn't sure about fever, but notes she has awakened a few times with her sheets wet. She denies any chest tightness. She has been using her inhaler. She also has taken some Tussionex she was prescribed back in the winter. As well, she has a promethazine-DM syrup.  Patient Active Problem List   Diagnosis Date Noted  . Restless leg syndrome 06/05/2020  . Pelvic organ prolapse quantification stage 2 rectocele 05/31/2020  . Barrett's esophagus determined by endoscopy 03/29/2020  . Hypothyroidism 03/29/2020  . Essential hypertension 03/29/2020  . Kidney stones 03/29/2020  . Asthma 03/29/2020  . Anxiety disorder 03/29/2020   Past Surgical History:  Procedure Laterality Date   . PARTIAL HYSTERECTOMY  1980   Pre-cancerous condition. Failed laser surgery. still have ovaries  . UMBILICAL HERNIA REPAIR     with mesh  . WRIST SURGERY Left 2019   Family History  Problem Relation Age of Onset  . Diabetes Mother   . Breast cancer Mother   . Colon cancer Father   . Kidney failure Father   . COPD Brother   . Diabetes Brother   . Heart disease Brother   . Depression Son   . Heart disease Maternal Uncle   . Heart disease Daughter    Social History   Tobacco Use  . Smoking status: Former Smoker    Quit date: 1995    Years since quitting: 27.3  . Smokeless tobacco: Never Used  Vaping Use  . Vaping Use: Never used  Substance Use Topics  . Alcohol use: Yes    Alcohol/week: 3.0 standard drinks    Types: 3 Standard drinks or equivalent per week    Comment: 1 per day  . Drug use: Never    Current Outpatient Medications:  .  albuterol (VENTOLIN HFA) 108 (90 Base) MCG/ACT inhaler, SMARTSIG:2 Puff(s) By Mouth Every 4 Hours PRN, Disp: 1 each, Rfl: 3 .  aspirin 325 MG tablet, Take 81 mg by mouth daily., Disp: , Rfl:  .  buPROPion (WELLBUTRIN SR) 150 MG 12 hr tablet, TAKE 1 TABLET(150 MG) BY MOUTH TWICE DAILY, Disp: 180 tablet, Rfl: 3 .  cholecalciferol (VITAMIN D3) 25 MCG (1000 UNIT) tablet, , Disp: , Rfl:  .  Cranberry-Vitamin C-Vitamin E (CRANBERRY PLUS VITAMIN C) 4200-20-3 MG-MG-UNIT CAPS, , Disp: ,  Rfl:  .  diazepam (VALIUM) 5 MG tablet, Take 0.5 tablets (2.5 mg total) by mouth in the morning and at bedtime., Disp: 90 tablet, Rfl: 0 .  HYDROcodone-homatropine (HYCODAN) 5-1.5 MG/5ML syrup, Take 5 mLs by mouth every 8 (eight) hours as needed for cough., Disp: 120 mL, Rfl: 0 .  levothyroxine (SYNTHROID) 88 MCG tablet, Take 1 tablet (88 mcg total) by mouth daily., Disp: 90 tablet, Rfl: 3 .  metoprolol succinate (TOPROL-XL) 25 MG 24 hr tablet, TAKE 1 TABLET(25 MG) BY MOUTH DAILY, Disp: 30 tablet, Rfl: 0 .  predniSONE (DELTASONE) 20 MG tablet, Take 1 tablet (20 mg  total) by mouth daily with breakfast., Disp: 5 tablet, Rfl: 0 .  promethazine (PHENERGAN) 25 MG tablet, Take 25 mg by mouth every 6 (six) hours as needed for nausea or vomiting., Disp: , Rfl:  .  pseudoephedrine-guaifenesin (TUSSIN PE) 30-100 MG/5ML SYRP, Take 5 mLs by mouth every 4 (four) hours as needed., Disp: , Rfl:  .  rOPINIRole (REQUIP) 1 MG tablet, Take 1 tablet (1 mg total) by mouth at bedtime., Disp: 90 tablet, Rfl: 3 .  SUPREP BOWEL PREP KIT 17.5-3.13-1.6 GM/177ML SOLN, Take 1 kit by mouth as directed. For colonoscopy prep, Disp: 354 mL, Rfl: 0  Allergies  Allergen Reactions  . Codeine Hives  . Sulfa Antibiotics Hives and Itching   ROS: See pertinent positives and negatives per HPI.  OBSERVATIONS/OBJECTIVE:  VITALS per patient if applicable: Today's Vitals   06/20/20 1126  Weight: 169 lb (76.7 kg)  Height: 5' 1.75" (1.568 m)   Body mass index is 31.16 kg/m.   GENERAL: Alert and oriented. Appears well and in no acute distress.  HEENT: Atraumatic. Conjunctiva clear. No obvious abnormalities on inspection of external nose and ears.  NECK: Normal movements of the head and neck.  LUNGS: On inspection, no signs of respiratory distress. Breathing rate appears normal. No obvious gross SOB, gasping or wheezing, and no conversational dyspnea. Several coughing spells noted with mild wheezing.  CV: No obvious cyanosis.  MS: Moves all visible extremities without noticeable abnormality.  PSYCH/NEURO: Pleasant and cooperative. No obvious depression or anxiety. Speech and thought processing grossly intact.  ASSESSMENT AND PLAN:  1. Mild intermittent asthma with exacerbation As Ms. Hallgren has some audible wheezing, I will place her on a short course of oral prednisone. She should continue to use her inhaler every 4 hours as needed.  - predniSONE (DELTASONE) 20 MG tablet; Take 1 tablet (20 mg total) by mouth daily with breakfast.  Dispense: 5 tablet; Refill: 0  2.  Bronchitis We discussed the likely viral nature of her bronchitis. I will provide some cough syrup for symptomatic relief. If not improving by Friday, I would like to see her that day. If improving, she should follow-up in 1-2 weeks.  - HYDROcodone-homatropine (HYCODAN) 5-1.5 MG/5ML syrup; Take 5 mLs by mouth every 8 (eight) hours as needed for cough.  Dispense: 120 mL; Refill: 0   I discussed the assessment and treatment plan with the patient. The patient was provided an opportunity to ask questions and all were answered. The patient agreed with the plan and demonstrated an understanding of the instructions.   The patient was advised to call back or seek an in-person evaluation if the symptoms worsen or if the condition fails to improve as anticipated.   [name], MD

## 2020-06-23 ENCOUNTER — Other Ambulatory Visit: Payer: Self-pay | Admitting: Family Medicine

## 2020-06-23 DIAGNOSIS — I10 Essential (primary) hypertension: Secondary | ICD-10-CM

## 2020-06-26 ENCOUNTER — Other Ambulatory Visit: Payer: Self-pay

## 2020-06-26 ENCOUNTER — Encounter: Payer: Self-pay | Admitting: Family Medicine

## 2020-06-26 ENCOUNTER — Ambulatory Visit (INDEPENDENT_AMBULATORY_CARE_PROVIDER_SITE_OTHER): Payer: PPO | Admitting: Family Medicine

## 2020-06-26 VITALS — BP 159/90 | HR 82 | Temp 97.0°F | Ht 61.5 in | Wt 168.6 lb

## 2020-06-26 DIAGNOSIS — J40 Bronchitis, not specified as acute or chronic: Secondary | ICD-10-CM

## 2020-06-26 DIAGNOSIS — F411 Generalized anxiety disorder: Secondary | ICD-10-CM | POA: Diagnosis not present

## 2020-06-26 DIAGNOSIS — J4521 Mild intermittent asthma with (acute) exacerbation: Secondary | ICD-10-CM | POA: Diagnosis not present

## 2020-06-26 MED ORDER — METHYLPREDNISOLONE 4 MG PO TBPK: ORAL_TABLET | ORAL | 0 refills | Status: DC

## 2020-06-26 MED ORDER — DIAZEPAM 5 MG PO TABS: 2.5000 mg | ORAL_TABLET | Freq: Two times a day (BID) | ORAL | 2 refills | Status: AC

## 2020-06-26 MED ORDER — HYDROCODONE-HOMATROPINE 5-1.5 MG/5ML PO SYRP: 5.0000 mL | ORAL_SOLUTION | Freq: Three times a day (TID) | ORAL | 0 refills | Status: DC | PRN

## 2020-06-26 NOTE — Progress Notes (Signed)
[facility] [address] [place] [address] Dept: [phone] Dept Fax: [phone]  Office Visit  Subjective:    Patient ID: Kimberly [name], female    DOB: [birth year], 70 y.o..   MRN: [id number]  Chief Complaint  Patient presents with  . Follow-up    F/u still having wheezing and anxiety when it happens.      History of Present Illness:  Patient is in today for reassessment of her bronchitis. I saw her for a video visit on 4/19. She had coughing with greenish-brown sputum production. She did note some chest tightness and has a past history of mild, intermittent asthma. I advised her to start using her albuterol inhaler. I also placed her on a 5-day course of prednisone 20 mg daily. She noted some mild improvement with this, but not resolution. She has been using hydrocodone cough syrup, but notes she uses this sparingly. She has a history of anxiety. She has had several panicky episodes recently and took an additional Xanax to help control this. She notes she each time had a since she was going to stop breathing and possibly die. She even considered calling an ambulance. The episodes passed and she was able to calm down.  Past Medical History: Patient Active Problem List   Diagnosis Date Noted  . Restless leg syndrome 06/05/2020  . Pelvic organ prolapse quantification stage 2 rectocele 05/31/2020  . Barrett's esophagus determined by endoscopy 03/29/2020  . Hypothyroidism 03/29/2020  . Essential hypertension 03/29/2020  . Kidney stones 03/29/2020  . Asthma 03/29/2020  . Anxiety disorder 03/29/2020   Past Surgical History:  Procedure Laterality Date  . PARTIAL HYSTERECTOMY  1980   Pre-cancerous condition. Failed laser surgery. still have ovaries  . UMBILICAL HERNIA REPAIR     with mesh  . WRIST SURGERY Left 2019   Family History  Problem Relation Age of Onset  . Diabetes Mother   . Breast cancer Mother   . Colon  cancer Father   . Kidney failure Father   . COPD Brother   . Diabetes Brother   . Heart disease Brother   . Depression Son   . Heart disease Maternal Uncle   . Heart disease Daughter    Outpatient Medications Prior to Visit  Medication Sig Dispense Refill  . albuterol (VENTOLIN HFA) 108 (90 Base) MCG/ACT inhaler SMARTSIG:2 Puff(s) By Mouth Every 4 Hours PRN 1 each 3  . aspirin 325 MG tablet Take 81 mg by mouth daily.    Marland Kitchen buPROPion (WELLBUTRIN SR) 150 MG 12 hr tablet TAKE 1 TABLET(150 MG) BY MOUTH TWICE DAILY 180 tablet 3  . cholecalciferol (VITAMIN D3) 25 MCG (1000 UNIT) tablet     . Cranberry-Vitamin C-Vitamin E (CRANBERRY PLUS VITAMIN C) 4200-20-3 MG-MG-UNIT CAPS     . levothyroxine (SYNTHROID) 88 MCG tablet Take 1 tablet (88 mcg total) by mouth daily. 90 tablet 3  . metoprolol succinate (TOPROL-XL) 25 MG 24 hr tablet TAKE 1 TABLET(25 MG) BY MOUTH DAILY 30 tablet 0  . promethazine (PHENERGAN) 25 MG tablet Take 25 mg by mouth every 6 (six) hours as needed for nausea or vomiting.    . pseudoephedrine-guaifenesin (TUSSIN PE) 30-100 MG/5ML SYRP Take 5 mLs by mouth every 4 (four) hours as needed.    Marland Kitchen rOPINIRole (REQUIP) 1 MG tablet Take 1 tablet (1 mg total) by mouth at bedtime. 90 tablet 3  . SUPREP BOWEL PREP KIT 17.5-3.13-1.6 GM/177ML SOLN Take 1 kit by mouth as  directed. For colonoscopy prep 354 mL 0  . diazepam (VALIUM) 5 MG tablet Take 0.5 tablets (2.5 mg total) by mouth in the morning and at bedtime. 90 tablet 0  . HYDROcodone-homatropine (HYCODAN) 5-1.5 MG/5ML syrup Take 5 mLs by mouth every 8 (eight) hours as needed for cough. 120 mL 0  . predniSONE (DELTASONE) 20 MG tablet Take 1 tablet (20 mg total) by mouth daily with breakfast. (Patient not taking: Reported on 06/26/2020) 5 tablet 0   No facility-administered medications prior to visit.   Allergies  Allergen Reactions  . Codeine Hives  . Sulfa Antibiotics Hives and Itching   Objective:   Today's Vitals   06/26/20 0927   BP: (!) 159/90  Pulse: 82  Temp: (!) 97 F (36.1 C)  TempSrc: Temporal  SpO2: 93%  Weight: 168 lb 9.6 oz (76.5 kg)  Height: 5' 1.5" (1.562 m)   Body mass index is 31.34 kg/m.   General: Well developed, well nourished. No acute distress. Lungs: End-expiratory wheezes noted diffusely. CV: RRR without murmurs or rubs. Pulses 2+ bilaterally. Psych: Alert and oriented. Normal mood and affect.  Health Maintenance Due  Topic Date Due  . Hepatitis C Screening  Never done  . COLONOSCOPY (Pts 45-25yr Insurance coverage will need to be confirmed)  Never done  . MAMMOGRAM  Never done  . DEXA SCAN  Never done  . PNA vac Low Risk Adult (1 of 2 - PCV13) Never done  . COVID-19 Vaccine (3 - Booster for Pfizer series) 12/13/2019     Assessment & Plan:   1. Mild intermittent asthma with acute exacerbation I will place Ms. Wever on a Medrol Dosepak in light of her ongoing wheeze. I recommended she use her albuterol inhaler, 2 puffs every 4 hours for 2 days, then as needed.  - methylPREDNISolone (MEDROL DOSEPAK) 4 MG TBPK tablet; Day 1: 24 mg on day 1 administered as 8 mg (2 tablets) before breakfast, 4 mg (1 tablet) after lunch, 4 mg (1 tablet) after supper, and 8 mg (2 tablets) at bedtime or 24 mg (6 tablets) as a single dose or divided into 2 or 3 doses upon initiation (regardless of time of day). Day 2: 20 mg on day 2 administered as 4 mg (1 tablet) before breakfast, 4 mg (1 tablet) after lunch, 4 mg (1 tablet) after supper, and 8 mg (2 tablets) at bedtime. Day 3: 16 mg on day 3 administered as 4 mg (1 tablet) before breakfast, 4 mg (1 tablet) after lunch, 4 mg (1 tablet) after supper, and 4 mg (1 tablet) at bedtime. Day 4: 12 mg on day 4 administered as 4 mg (1 tablet) before breakfast, 4 mg (1 tablet) after lunch, and 4 mg (1 tablet) at bedtime. Day 5: 8 mg on day 5 administered as 4 mg (1 tablet) before breakfast and 4 mg (1 tablet) at bedtime. Day 6: 4 mg on day 6 administered as 4 mg (1  tablet) before breakfast.  Dispense: 21 tablet; Refill: 0  2. Bronchitis I will renew her cough syrup.  - HYDROcodone-homatropine (HYCODAN) 5-1.5 MG/5ML syrup; Take 5 mLs by mouth every 8 (eight) hours as needed for cough.  Dispense: 120 mL; Refill: 0  3. Generalized anxiety disorder We did discuss how her asthma, albuterol, and prednisone could be triggering her panc attacks right now. We discussed using controlled breathing and positive mental affirmations to help her when attacks occur. However, if not resolving within 15 min, she can take 1 additional  dose of Xanax a day.  - diazepam (VALIUM) 5 MG tablet; Take 0.5-1 tablets (2.5-5 mg total) by mouth in the morning and at bedtime. May take an additional dose daily if needed.  Dispense: 60 tablet; Refill: 2  [name], MD

## 2020-07-11 ENCOUNTER — Other Ambulatory Visit: Payer: Self-pay

## 2020-07-12 ENCOUNTER — Encounter: Payer: Self-pay | Admitting: Family Medicine

## 2020-07-12 ENCOUNTER — Ambulatory Visit (INDEPENDENT_AMBULATORY_CARE_PROVIDER_SITE_OTHER): Payer: PPO | Admitting: Family Medicine

## 2020-07-12 VITALS — BP 132/84 | HR 72 | Temp 97.6°F | Ht 61.5 in | Wt 169.0 lb

## 2020-07-12 DIAGNOSIS — J4520 Mild intermittent asthma, uncomplicated: Secondary | ICD-10-CM

## 2020-07-12 DIAGNOSIS — J4521 Mild intermittent asthma with (acute) exacerbation: Secondary | ICD-10-CM | POA: Diagnosis not present

## 2020-07-12 DIAGNOSIS — N816 Rectocele: Secondary | ICD-10-CM | POA: Diagnosis not present

## 2020-07-12 DIAGNOSIS — F411 Generalized anxiety disorder: Secondary | ICD-10-CM | POA: Diagnosis not present

## 2020-07-12 MED ORDER — ALBUTEROL SULFATE HFA 108 (90 BASE) MCG/ACT IN AERS
INHALATION_SPRAY | RESPIRATORY_TRACT | 3 refills | Status: DC
Start: 2020-07-12 — End: 2020-08-21

## 2020-07-12 NOTE — Progress Notes (Signed)
[facility] [address] [place] [address] Dept: [phone] Dept Fax: [phone]  Office Visit  Subjective:    Patient ID: Kimberly [name], female    DOB: [birth year], 70 y.o..   MRN: [id number]  Chief Complaint  Patient presents with  . Follow-up    2 week f/u Anxiety. She states feeling tired  and having incontinence issues.     History of Present Illness:  Patient is in today for reassessment of her recent asthma exacerbation due to bronchitis. She was last seen on 06/26/2020 and prescribed a Medrol DosePak. She notes that her breathing is much improved. She has some mild lingering cough with mucous in her upper bronchi. She is no longer using or needing the cough syrup for this.  [name] had been experiencing a exacerbation of her anxiety, likely related to her asthma exacerbation, increased use of albuterol, and being on prednisone. She admits that this is doing better at this point.   [name] has a history of pelvic prolapse. She was seen by Dr. Judeth Horn (OB-GYN) in March for evaluation. Dr. Judeth Horn offered to fit her with a pessary. [name] had requested to delay fitting for this until she could complete colonoscopy and upper endoscopy with GI. Now she notes over the past week that she is having both fecal and urinary incontinence, requiring her to wear adult diapers. She feels embarrassed about this. She prefers not to use a pessary, but would want to proceed with surgical options if possible.  Past Medical History: Patient Active Problem List   Diagnosis Date Noted  . Restless leg syndrome 06/05/2020  . Pelvic organ prolapse quantification stage 2 rectocele 05/31/2020  . Barrett's esophagus determined by endoscopy 03/29/2020  . Hypothyroidism 03/29/2020  . Essential hypertension 03/29/2020  . Kidney stones 03/29/2020  . Asthma 03/29/2020  . Anxiety disorder 03/29/2020   Past Surgical History:   Procedure Laterality Date  . PARTIAL HYSTERECTOMY  1980   Pre-cancerous condition. Failed laser surgery. still have ovaries  . UMBILICAL HERNIA REPAIR     with mesh  . WRIST SURGERY Left 2019   Family History  Problem Relation Age of Onset  . Diabetes Mother   . Breast cancer Mother   . Colon cancer Father   . Kidney failure Father   . COPD Brother   . Diabetes Brother   . Heart disease Brother   . Depression Son   . Heart disease Maternal Uncle   . Heart disease Daughter    Outpatient Medications Prior to Visit  Medication Sig Dispense Refill  . albuterol (VENTOLIN HFA) 108 (90 Base) MCG/ACT inhaler SMARTSIG:2 Puff(s) By Mouth Every 4 Hours PRN 1 each 3  . aspirin 325 MG tablet Take 81 mg by mouth daily.    Marland Kitchen buPROPion (WELLBUTRIN SR) 150 MG 12 hr tablet TAKE 1 TABLET(150 MG) BY MOUTH TWICE DAILY 180 tablet 3  . cholecalciferol (VITAMIN D3) 25 MCG (1000 UNIT) tablet     . Cranberry 1000 MG CAPS Take by mouth.    . diazepam (VALIUM) 5 MG tablet Take 0.5-1 tablets (2.5-5 mg total) by mouth in the morning and at bedtime. May take an additional dose daily if needed. 60 tablet 2  . levothyroxine (SYNTHROID) 88 MCG tablet Take 1 tablet (88 mcg total) by mouth daily. 90 tablet 3  . metoprolol succinate (TOPROL-XL) 25 MG 24 hr tablet TAKE 1 TABLET(25 MG) BY MOUTH DAILY 30 tablet 0  . promethazine (PHENERGAN)  25 MG tablet Take 25 mg by mouth every 6 (six) hours as needed for nausea or vomiting.    Marland Kitchen rOPINIRole (REQUIP) 1 MG tablet Take 1 tablet (1 mg total) by mouth at bedtime. 90 tablet 3  . SUPREP BOWEL PREP KIT 17.5-3.13-1.6 GM/177ML SOLN Take 1 kit by mouth as directed. For colonoscopy prep 354 mL 0  . Cranberry-Vitamin C-Vitamin E (CRANBERRY PLUS VITAMIN C) 4200-20-3 MG-MG-UNIT CAPS  (Patient not taking: Reported on 07/12/2020)    . HYDROcodone-homatropine (HYCODAN) 5-1.5 MG/5ML syrup Take 5 mLs by mouth every 8 (eight) hours as needed for cough. (Patient not taking: Reported on  07/12/2020) 120 mL 0  . methylPREDNISolone (MEDROL DOSEPAK) 4 MG TBPK tablet Day 1: 24 mg on day 1 administered as 8 mg (2 tablets) before breakfast, 4 mg (1 tablet) after lunch, 4 mg (1 tablet) after supper, and 8 mg (2 tablets) at bedtime or 24 mg (6 tablets) as a single dose or divided into 2 or 3 doses upon initiation (regardless of time of day). Day 2: 20 mg on day 2 administered as 4 mg (1 tablet) before breakfast, 4 mg (1 tablet) after lunch, 4 mg (1 tablet) after supper, and 8 mg (2 tablets) at bedtime. Day 3: 16 mg on day 3 administered as 4 mg (1 tablet) before breakfast, 4 mg (1 tablet) after lunch, 4 mg (1 tablet) after supper, and 4 mg (1 tablet) at bedtime. Day 4: 12 mg on day 4 administered as 4 mg (1 tablet) before breakfast, 4 mg (1 tablet) after lunch, and 4 mg (1 tablet) at bedtime. Day 5: 8 mg on day 5 administered as 4 mg (1 tablet) before breakfast and 4 mg (1 tablet) at bedtime. Day 6: 4 mg on day 6 administered as 4 mg (1 tablet) before breakfast. (Patient not taking: Reported on 07/12/2020) 21 tablet 0  . pseudoephedrine-guaifenesin (TUSSIN PE) 30-100 MG/5ML SYRP Take 5 mLs by mouth every 4 (four) hours as needed. (Patient not taking: Reported on 07/12/2020)     No facility-administered medications prior to visit.   Allergies  Allergen Reactions  . Codeine Hives  . Sulfa Antibiotics Hives and Itching    Objective:   Today's Vitals   07/12/20 0808  BP: 132/84  Pulse: 72  Temp: 97.6 F (36.4 C)  TempSrc: Temporal  SpO2: 99%  Weight: 169 lb (76.7 kg)  Height: 5' 1.5" (1.562 m)   Body mass index is 31.42 kg/m.   General: Well developed, well nourished. No acute distress. Lungs: Clear to auscultation bilaterally. No wheezing, rales or rhonchi. Psych: Alert and oriented. Normal mood and affect.  Health Maintenance Due  Topic Date Due  . Hepatitis C Screening  Never done  . COLONOSCOPY (Pts 45-15yr Insurance coverage will need to be confirmed)  Never done  .  MAMMOGRAM  Never done  . DEXA SCAN  Never done  . PNA vac Low Risk Adult (1 of 2 - PCV13) Never done  . COVID-19 Vaccine (3 - Booster for Pfizer series) 12/13/2019     Assessment & Plan:   1. Mild intermittent asthma with acute exacerbation Asthma exacerbation is much improved at this point. She can continue her intermittent use of albuterol.  2. Generalized anxiety disorder Generalized anxiety is improved at this point. We will continue Wellbutrin and PRN diazepam use. I will reassess her in 3 months.  3. Pelvic organ prolapse quantification stage 2 rectocele I reviewed the consult notes from GYN. The new findings of  urinary and fecal incontinence likely related to progressive issues with her pelvic organ prolapse and exacerbation due to coughing from her recent bronchitis. Ms. Tison is not interested int he use of a pessary. I recommend she follow back up with Dr. Judeth Horn to discuss surgical options, esp. in light of her new incontinence issues.  [name], MD

## 2020-07-22 DIAGNOSIS — Z885 Allergy status to narcotic agent status: Secondary | ICD-10-CM | POA: Diagnosis not present

## 2020-07-22 DIAGNOSIS — Z882 Allergy status to sulfonamides status: Secondary | ICD-10-CM | POA: Diagnosis not present

## 2020-07-22 DIAGNOSIS — Z87891 Personal history of nicotine dependence: Secondary | ICD-10-CM | POA: Diagnosis not present

## 2020-07-22 DIAGNOSIS — Z79899 Other long term (current) drug therapy: Secondary | ICD-10-CM | POA: Diagnosis not present

## 2020-07-22 DIAGNOSIS — S32402A Unspecified fracture of left acetabulum, initial encounter for closed fracture: Secondary | ICD-10-CM | POA: Diagnosis not present

## 2020-07-22 DIAGNOSIS — M79671 Pain in right foot: Secondary | ICD-10-CM | POA: Diagnosis not present

## 2020-07-23 ENCOUNTER — Other Ambulatory Visit: Payer: Self-pay | Admitting: Family Medicine

## 2020-07-23 ENCOUNTER — Encounter: Payer: Self-pay | Admitting: Family Medicine

## 2020-07-23 DIAGNOSIS — I10 Essential (primary) hypertension: Secondary | ICD-10-CM

## 2020-07-23 DIAGNOSIS — S32409D Unspecified fracture of unspecified acetabulum, subsequent encounter for fracture with routine healing: Secondary | ICD-10-CM

## 2020-07-24 MED ORDER — HYDROCODONE-ACETAMINOPHEN 10-325 MG PO TABS: 1.0000 | ORAL_TABLET | Freq: Three times a day (TID) | ORAL | 0 refills | Status: AC | PRN

## 2020-07-24 NOTE — Telephone Encounter (Signed)
Pt requested call back from [place] at earliest convenience at [phone]

## 2020-07-27 ENCOUNTER — Encounter: Payer: Self-pay | Admitting: Family Medicine

## 2020-07-28 ENCOUNTER — Encounter: Payer: Self-pay | Admitting: Family Medicine

## 2020-08-01 ENCOUNTER — Encounter: Payer: Self-pay | Admitting: Family Medicine

## 2020-08-01 NOTE — Telephone Encounter (Signed)
Spoke to patient, regarding her fracture and she states that she has been resting, using a walker/wheel chair.  She has an appt scheduled for 08/17/20 @ 9:30 and will keep that appt. Advised if she would like to move it up she can call the office back to rescheduled it.  No further questions. Dm/cma

## 2020-08-03 NOTE — Telephone Encounter (Signed)
Patient scheduled for an appt on 08/09/20 @ 4 pm. Dm/cma

## 2020-08-09 ENCOUNTER — Ambulatory Visit (INDEPENDENT_AMBULATORY_CARE_PROVIDER_SITE_OTHER): Payer: PPO | Admitting: Family Medicine

## 2020-08-09 ENCOUNTER — Other Ambulatory Visit: Payer: Self-pay

## 2020-08-09 ENCOUNTER — Encounter: Payer: Self-pay | Admitting: Family Medicine

## 2020-08-09 VITALS — BP 116/78 | HR 81 | Temp 98.3°F | Ht 61.5 in | Wt 168.0 lb

## 2020-08-09 DIAGNOSIS — M25552 Pain in left hip: Secondary | ICD-10-CM

## 2020-08-09 DIAGNOSIS — Z13820 Encounter for screening for osteoporosis: Secondary | ICD-10-CM

## 2020-08-09 NOTE — Progress Notes (Signed)
[facility] [address] [place] [address] Dept: [phone] Dept Fax: [phone]  Office Visit  Subjective:    Patient ID: Kimberly [name], female    DOB: [birth year], 70 y.o..   MRN: [id number]  Chief Complaint  Patient presents with  . Follow-up    F/u after abusion fracture on 07/22/20. Needs a refill on pain meds.     History of Present Illness:  Patient is in today for reassessment of a left hip fracture. Kimberly [name] was in [place], visiting family. She denies any acute injury at the time. 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 She was seen at [facility] ED in , [place] on 07/22/2020. An x-ray of her left hip was performed. Apparently, the x-ray showed evidence of a left acetabular avulsion fracture. She was seen by an orthopedist and eventually released, but with instructions to be non-weight bearing and to follow-up with her PCP. Kimberly [name] did experience pain up until ~ 5/31, but then it resolved. Since then, she has been able to ambulate on her won without pain. She does recall having injured the left hip previously about 3 years ago in a fall. She apparently did not have this evaluated then.  Past Medical History: Patient Active Problem List   Diagnosis Date Noted  . Restless leg syndrome 06/05/2020  . Pelvic organ prolapse quantification stage 2 rectocele 05/31/2020  . Barrett's esophagus determined by endoscopy 03/29/2020  . Hypothyroidism 03/29/2020  . Essential hypertension 03/29/2020  . Kidney stones 03/29/2020  . Asthma 03/29/2020  . Anxiety disorder 03/29/2020   Past Surgical History:  Procedure Laterality Date  . PARTIAL HYSTERECTOMY  1980   Pre-cancerous condition. Failed laser surgery. still have ovaries  . UMBILICAL HERNIA REPAIR     with mesh  . WRIST SURGERY Left 2019   Family History  Problem Relation Age of Onset  . Diabetes Mother   . Breast  cancer Mother   . Colon cancer Father   . Kidney failure Father   . COPD Brother   . Diabetes Brother   . Heart disease Brother   . Depression Son   . Heart disease Maternal Uncle   . Heart disease Daughter    Outpatient Medications Prior to Visit  Medication Sig Dispense Refill  . albuterol (VENTOLIN HFA) 108 (90 Base) MCG/ACT inhaler SMARTSIG:2 Puff(s) By Mouth Every 4 Hours PRN 1 each 3  . aspirin 325 MG tablet Take 81 mg by mouth daily.    Marland Kitchen buPROPion (WELLBUTRIN SR) 150 MG 12 hr tablet TAKE 1 TABLET(150 MG) BY MOUTH TWICE DAILY 180 tablet 3  . cholecalciferol (VITAMIN D3) 25 MCG (1000 UNIT) tablet     . Cranberry 1000 MG CAPS Take by mouth.    . diazepam (VALIUM) 5 MG tablet Take 0.5-1 tablets (2.5-5 mg total) by mouth in the morning and at bedtime. May take an additional dose daily if needed. 60 tablet 2  . levothyroxine (SYNTHROID) 88 MCG tablet Take 1 tablet (88 mcg total) by mouth daily. 90 tablet 3  . metoprolol succinate (TOPROL-XL) 25 MG 24 hr tablet TAKE 1 TABLET(25 MG) BY MOUTH DAILY 90 tablet 3  . promethazine (PHENERGAN) 25 MG tablet Take 25 mg by mouth every 6 (six) hours as needed for nausea or vomiting.    Marland Kitchen rOPINIRole (REQUIP) 1 MG tablet Take 1 tablet (1 mg total) by mouth at bedtime. 90 tablet 3  . SUPREP BOWEL  PREP KIT 17.5-3.13-1.6 GM/177ML SOLN Take 1 kit by mouth as directed. For colonoscopy prep 354 mL 0   No facility-administered medications prior to visit.   Allergies  Allergen Reactions  . Codeine Hives  . Sulfa Antibiotics Hives and Itching    Objective:   Today's Vitals   08/09/20 1558  BP: 116/78  Pulse: 81  Temp: 98.3 F (36.8 C)  TempSrc: Temporal  SpO2: 96%  Weight: 168 lb (76.2 kg)  Height: 5' 1.5" (1.562 m)   Body mass index is 31.23 kg/m.   General: Well developed, well nourished. No acute distress. Psych: Alert and oriented. Normal mood and affect.  Health Maintenance Due  Topic Date Due  . Pneumococcal Vaccine 9-63 Years old  (1 of 2 - PPSV23) Never done  . Hepatitis C Screening  Never done  . MAMMOGRAM  Never done  . Zoster Vaccines- Shingrix (1 of 2) Never done  . DEXA SCAN  Never done  . PNA vac Low Risk Adult (1 of 2 - PCV13) Never done  . COVID-19 Vaccine (3 - Booster for Pfizer series) 11/13/2019     Assessment & Plan:   1. Left hip pain I reviewed patient discharge instructions. It is not clear that the avulsion fracture noted by the ED physician was actually an acute fracture. This may have been due to the fall she had three years ago. It is possible her more recent pain issue was a sciatica or hip bursitis, which has now resolved. I will request records from the hospital in [place]. I will also request an evaluation from orthopedics. If the fracture is old, I suspect there is not any further recommended treatments for this.  - Ambulatory referral to Orthopedic Surgery  2. Screening for osteoporosis In light of the unclear history of possibly an unprovoked fracture, I do feel it would be appropriate to move ahead with a Dexa scan to assess for fracture risk/osteoporosis.  - DG Bone Density; Future  [name], MD

## 2020-08-10 ENCOUNTER — Encounter: Payer: Self-pay | Admitting: Family Medicine

## 2020-08-11 ENCOUNTER — Other Ambulatory Visit: Payer: Self-pay

## 2020-08-15 ENCOUNTER — Encounter: Payer: Self-pay | Admitting: Family Medicine

## 2020-08-15 ENCOUNTER — Ambulatory Visit: Payer: Self-pay

## 2020-08-15 ENCOUNTER — Encounter: Payer: Self-pay | Admitting: Orthopaedic Surgery

## 2020-08-15 ENCOUNTER — Ambulatory Visit: Payer: PPO | Admitting: Orthopaedic Surgery

## 2020-08-15 DIAGNOSIS — M79605 Pain in left leg: Secondary | ICD-10-CM | POA: Diagnosis not present

## 2020-08-15 DIAGNOSIS — M25552 Pain in left hip: Secondary | ICD-10-CM

## 2020-08-15 NOTE — Progress Notes (Signed)
Office Visit Note   Patient: Kimberly [name]           Date of Birth: 1950-12-12           MRN: [id number] Visit Date: 08/15/2020              Requested by: [name], MD [place],  [place] [address] PCP: [name], MD   Assessment & Plan: Visit Diagnoses:  1. Pain in left hip     Plan: Impression is resolved left hip and leg pain.  I believe her symptoms have actually been coming from her back and not her hip.  I am not seeing any evidence of fracture on x-ray today.  At this point, her symptoms have resolved.  She will start to increase activity as tolerated and follow-up with [place] as needed.  Call with concerns or questions in the meantime  Follow-Up Instructions: No follow-ups on file.   Orders:  Orders Placed This Encounter  Procedures   XR HIP UNILAT W OR W/O PELVIS 2-3 VIEWS LEFT   No orders of the defined types were placed in this encounter.     Procedures: No procedures performed   Clinical Data: No additional findings.   Subjective: Chief Complaint  Patient presents with   Left Hip - Pain    HPI patient is a very pleasant 70 year old female who is new to town from [place].  She is here with concerns about her left hip.  She was driving from [place] to [place] about 1 month ago when she began to have left hip pain.  She stretched out her leg which seemed to worsen her symptoms.  When she arrived in [place], she went to the ED due to the significance of pain.  The pain was not only to the groin but to the buttocks and down the entire left leg.  X-rays of the hip were obtained which sounds like showed an avulsion to the acetabulum.  She is also having paresthesias to the left lower extremity.  She was prescribed Norco and was taking this in addition to Motrin.  She also rested for several weeks.  The combination seem to alleviate her symptoms.  She is here today without any pain.  Review of Systems as detailed in HPI.  All  others reviewed and are negative.   Objective: Vital Signs: There were no vitals taken for this visit.  Physical Exam well-developed well-nourished female in no acute distress.  Alert and oriented x3.  Ortho Exam left hip exam shows negative logroll negative FADIR.  Negative straight leg raise.  She is neurovascular intact distally.  Specialty Comments:  No specialty comments available.  Imaging: No results found.   PMFS History: Patient Active Problem List   Diagnosis Date Noted   Restless leg syndrome 06/05/2020   Pelvic organ prolapse quantification stage 2 rectocele 05/31/2020   Barrett's esophagus determined by endoscopy 03/29/2020   Hypothyroidism 03/29/2020   Essential hypertension 03/29/2020   Kidney stones 03/29/2020   Asthma 03/29/2020   Anxiety disorder 03/29/2020   Past Medical History:  Diagnosis Date   Anxiety    Asthma    Barrett esophagus    Chronic headaches    Colon polyps    COVID 03/2020   Depression    GERD (gastroesophageal reflux disease)    Hypertension    Kidney stones    Pneumonia    Thyroid disease    Uterine cancer ([place])  Family History  Problem Relation Age of Onset   Diabetes Mother    Breast cancer Mother    Colon cancer Father    Kidney failure Father    COPD Brother    Diabetes Brother    Heart disease Brother    Depression Son    Heart disease Maternal Uncle    Heart disease Daughter     Past Surgical History:  Procedure Laterality Date   PARTIAL HYSTERECTOMY  1980   Pre-cancerous condition. Failed laser surgery. still have ovaries   UMBILICAL HERNIA REPAIR     with mesh   WRIST SURGERY Left 2019   Social History   Occupational History   Occupation: retired    Comment: Retired  Tobacco Use   Smoking status: Former    Pack years: 0.00    Types: Cigarettes    Quit date: 1995    Years since quitting: 27.4   Smokeless tobacco: Never  Vaping Use   Vaping Use: Never used  Substance and Sexual Activity    Alcohol use: Yes    Alcohol/week: 3.0 standard drinks    Types: 3 Standard drinks or equivalent per week    Comment: 1 per day   Drug use: Never   Sexual activity: Not Currently    Birth control/protection: Surgical    Comment: Hyst

## 2020-08-17 ENCOUNTER — Ambulatory Visit: Payer: PPO | Admitting: Family Medicine

## 2020-08-21 ENCOUNTER — Encounter: Payer: Self-pay | Admitting: Family Medicine

## 2020-08-21 DIAGNOSIS — J4520 Mild intermittent asthma, uncomplicated: Secondary | ICD-10-CM

## 2020-08-21 MED ORDER — ALBUTEROL SULFATE HFA 108 (90 BASE) MCG/ACT IN AERS: INHALATION_SPRAY | RESPIRATORY_TRACT | 3 refills | Status: DC

## 2020-08-31 ENCOUNTER — Inpatient Hospital Stay: Admission: RE | Admit: 2020-08-31 | Payer: PPO | Source: Ambulatory Visit

## 2020-09-06 ENCOUNTER — Other Ambulatory Visit: Payer: Self-pay

## 2020-09-06 ENCOUNTER — Ambulatory Visit (INDEPENDENT_AMBULATORY_CARE_PROVIDER_SITE_OTHER)
Admission: RE | Admit: 2020-09-06 | Discharge: 2020-09-06 | Disposition: A | Discharge status: Home / Self Care | Payer: PPO | Source: Ambulatory Visit | Attending: Family Medicine | Admitting: Family Medicine

## 2020-09-06 DIAGNOSIS — Z13820 Encounter for screening for osteoporosis: Secondary | ICD-10-CM | POA: Diagnosis not present

## 2020-09-08 ENCOUNTER — Encounter: Payer: Self-pay | Admitting: Family Medicine

## 2020-09-08 DIAGNOSIS — M8580 Other specified disorders of bone density and structure, unspecified site: Secondary | ICD-10-CM | POA: Insufficient documentation

## 2020-09-22 ENCOUNTER — Ambulatory Visit (AMBULATORY_SURGERY_CENTER): Payer: PPO | Admitting: Internal Medicine

## 2020-09-22 ENCOUNTER — Encounter: Payer: Self-pay | Admitting: Family Medicine

## 2020-09-22 ENCOUNTER — Encounter: Payer: Self-pay | Admitting: Internal Medicine

## 2020-09-22 ENCOUNTER — Other Ambulatory Visit: Payer: Self-pay

## 2020-09-22 VITALS — BP 149/83 | HR 72 | Temp 97.7°F | Resp 18 | Ht 61.0 in | Wt 168.0 lb

## 2020-09-22 DIAGNOSIS — Z86010 Personal history of colonic polyps: Secondary | ICD-10-CM | POA: Diagnosis not present

## 2020-09-22 DIAGNOSIS — K219 Gastro-esophageal reflux disease without esophagitis: Secondary | ICD-10-CM | POA: Diagnosis not present

## 2020-09-22 DIAGNOSIS — Z800 Family history of malignant neoplasm of digestive organs: Secondary | ICD-10-CM | POA: Diagnosis not present

## 2020-09-22 DIAGNOSIS — K449 Diaphragmatic hernia without obstruction or gangrene: Secondary | ICD-10-CM | POA: Diagnosis not present

## 2020-09-22 DIAGNOSIS — K5790 Diverticulosis of intestine, part unspecified, without perforation or abscess without bleeding: Secondary | ICD-10-CM | POA: Insufficient documentation

## 2020-09-22 DIAGNOSIS — Z1211 Encounter for screening for malignant neoplasm of colon: Secondary | ICD-10-CM | POA: Diagnosis not present

## 2020-09-22 DIAGNOSIS — K2270 Barrett's esophagus without dysplasia: Secondary | ICD-10-CM

## 2020-09-22 DIAGNOSIS — K2100 Gastro-esophageal reflux disease with esophagitis, without bleeding: Secondary | ICD-10-CM | POA: Diagnosis not present

## 2020-09-22 DIAGNOSIS — K222 Esophageal obstruction: Secondary | ICD-10-CM

## 2020-09-22 MED ORDER — SODIUM CHLORIDE 0.9 % IV SOLN: 500.0000 mL | Freq: Once | INTRAVENOUS | Status: DC

## 2020-09-22 NOTE — Patient Instructions (Addendum)
YOU HAD AN ENDOSCOPIC PROCEDURE TODAY AT [facility]:   Refer to the procedure report that was given to you for any specific questions about what was found during the examination.  If the procedure report does not answer your questions, please call your gastroenterologist to clarify.  If you requested that your care partner not be given the details of your procedure findings, then the procedure report has been included in a sealed envelope for you to review at your convenience later.  YOU SHOULD EXPECT: Some feelings of bloating in the abdomen. Passage of more gas than usual.  Walking can help get rid of the air that was put into your GI tract during the procedure and reduce the bloating. If you had a lower endoscopy (such as a colonoscopy or flexible sigmoidoscopy) you may notice spotting of blood in your stool or on the toilet paper. If you underwent a bowel prep for your procedure, you may not have a normal bowel movement for a few days.  Please Note:  You might notice some irritation and congestion in your nose or some drainage.  This is from the oxygen used during your procedure.  There is no need for concern and it should clear up in a day or so.  SYMPTOMS TO REPORT IMMEDIATELY:  Following lower endoscopy (colonoscopy or flexible sigmoidoscopy):  Excessive amounts of blood in the stool  Significant tenderness or worsening of abdominal pains  Swelling of the abdomen that is new, acute  Fever of 100F or higher  Following upper endoscopy (EGD)  Vomiting of blood or coffee ground material  New chest pain or pain under the shoulder blades  Painful or persistently difficult swallowing  New shortness of breath  Fever of 100F or higher  Black, tarry-looking stools  For urgent or emergent issues, a gastroenterologist can be reached at any hour by calling [phone]. Do not use MyChart messaging for urgent concerns.    DIET:  We do recommend a small meal at first, but  then you may proceed to your regular diet.  Drink plenty of fluids but you should avoid alcoholic beverages for 24 hours.  MEDICATIONS: Continue present medications. If you are having reflux symptoms or difficulty swallowing, Dr. Henrene Pastor would recommend changing your acid reflux medication to Omeprazole (Prilosec) 20 mg by mouth daily (this medication can be purchased over the counter).  Please see handouts given to you by your recovery nurse.  Thank you for allowing [place] to provide for your healthcare needs today.  ACTIVITY:  You should plan to take it easy for the rest of today and you should NOT DRIVE or use heavy machinery until tomorrow (because of the sedation medicines used during the test).    FOLLOW UP: Our staff will call the number listed on your records 48-72 hours following your procedure to check on you and address any questions or concerns that you may have regarding the information given to you following your procedure. If we do not reach you, we will leave a message.  We will attempt to reach you two times.  During this call, we will ask if you have developed any symptoms of COVID 19. If you develop any symptoms (ie: fever, flu-like symptoms, shortness of breath, cough etc.) before then, please call [phone].  If you test positive for Covid 19 in the 2 weeks post procedure, please call and report this information to [place].    If any biopsies were taken you will be contacted by  phone or by letter within the next 1-3 weeks.  Please call us at [phone] if you have not heard about the biopsies in 3 weeks.    SIGNATURES/CONFIDENTIALITY: You and/or your care partner have signed paperwork which will be entered into your electronic medical record.  These signatures attest to the fact that that the information above on your After Visit Summary has been reviewed and is understood.  Full responsibility of the confidentiality of this discharge information lies with you and/or your  care-partner.

## 2020-09-22 NOTE — Op Note (Signed)
[facility] Patient Name: Kimberly [name] Procedure Date: 09/22/2020 7:52 AM MRN: [id number] Endoscopist: Docia Chuck. Kimberly [name] , MD Age: 70 Referring MD:  Date of Birth: May 11, 1950 Gender: Female Account #: [id number] Procedure:                Upper GI endoscopy Indications:              Follow-up of Barrett's esophagus. Said to have                            Barrett's esophagus on upper endoscopy in [place]. Nondysplastic Medicines:                Monitored Anesthesia Care Procedure:                Pre-Anesthesia Assessment:                           - Prior to the procedure, a History and Physical                            was performed, and patient medications and                            allergies were reviewed. The patient's tolerance of                            previous anesthesia was also reviewed. The risks                            and benefits of the procedure and the sedation                            options and risks were discussed with the patient.                            All questions were answered, and informed consent                            was obtained. Prior Anticoagulants: The patient has                            taken no previous anticoagulant or antiplatelet                            agents. ASA Grade Assessment: II - A patient with                            mild systemic disease. After reviewing the risks                            and benefits, the patient was deemed in  satisfactory condition to undergo the procedure.                           After obtaining informed consent, the endoscope was                            passed under direct vision. Throughout the                            procedure, the patient's blood pressure, pulse, and                            oxygen saturations were monitored continuously. The                            GIF [id number] was introduced  through the                            mouth, and advanced to the second part of duodenum.                            The upper GI endoscopy was accomplished without                            difficulty. The patient tolerated the procedure                            well. Scope In: Scope Out: Findings:                 The esophagus revealed mild distal esophagitis and                            a large caliber peptic stricture. There was no                            columnar mucosa in the esophagus proper that would                            meet the endoscopic definition of Barrett's                            esophagus.                           The stomach revealed a sliding hiatal hernia, but                            was otherwise normal.                           The examined duodenum was normal.                           The cardia and gastric fundus were normal on  retroflexion. Complications:            No immediate complications. Estimated Blood Loss:     Estimated blood loss: none. Impression:               1. GERD with mild esophagitis and large caliber                            stricture                           2. Hiatal hernia                           3. Otherwise normal EGD                           4. No evidence for Barrett's esophagus. Recommendation:           - Patient has a contact number available for                            emergencies. The signs and symptoms of potential                            delayed complications were discussed with the                            patient. Return to normal activities tomorrow.                            Written discharge instructions were provided to the                            patient.                           - Resume previous diet.                           - Continue present medications.                           - If you are having active reflux symptoms or                             difficulty swallowing I would recommend changing                            your acid reflux medication to omeprazole                            (Prilosec) OTC 20 mg daily Kimberly [name] N. Kimberly Pastor, MD 09/22/2020 9:07:19 AM This report has been signed electronically.

## 2020-09-22 NOTE — Progress Notes (Signed)
VS taken by C.W. 

## 2020-09-22 NOTE — Progress Notes (Signed)
A/ox3, pleased with MAC, report to RN 

## 2020-09-22 NOTE — Op Note (Signed)
[facility] Patient Name: Kimberly [name] Procedure Date: 09/22/2020 7:53 AM MRN: [id number] Endoscopist: Docia Chuck. Henrene Pastor , MD Age: 70 Referring MD:  Date of Birth: [birth year] Gender: Female Account #: [id number] Procedure:                Colonoscopy Indications:              Colon cancer screening in patient at increased                            risk: Colorectal cancer in father. Previous                            examination in [place] with hyperplastic                            polyps, diverticulosis. Medicines:                Monitored Anesthesia Care Procedure:                Pre-Anesthesia Assessment:                           - Prior to the procedure, a History and Physical                            was performed, and patient medications and                            allergies were reviewed. The patient's tolerance of                            previous anesthesia was also reviewed. The risks                            and benefits of the procedure and the sedation                            options and risks were discussed with the patient.                            All questions were answered, and informed consent                            was obtained. Prior Anticoagulants: The patient has                            taken no previous anticoagulant or antiplatelet                            agents. ASA Grade Assessment: II - A patient with                            mild systemic disease. After reviewing the risks  and benefits, the patient was deemed in                            satisfactory condition to undergo the procedure.                           After obtaining informed consent, the colonoscope                            was passed under direct vision. Throughout the                            procedure, the patient's blood pressure, pulse, and                            oxygen saturations were monitored continuously. The                             CF [id number] was introduced through the anus                            and advanced to the the cecum, identified by                            appendiceal orifice and ileocecal valve. The                            ileocecal valve, appendiceal orifice, and rectum                            were photographed. The quality of the bowel                            preparation was excellent. The colonoscopy was                            performed without difficulty. The patient tolerated                            the procedure well. The bowel preparation used was                            SUPREP via split dose instruction. Scope In: 8:25:46 AM Scope Out: 8:39:54 AM Scope Withdrawal Time: 0 hours 8 minutes 3 seconds  Total Procedure Duration: 0 hours 14 minutes 8 seconds  Findings:                 Multiple diverticula were found in the left colon                            and right colon. There was moderate sigmoid                            stenosis.  The exam was otherwise without abnormality on                            direct and retroflexion views. Complications:            No immediate complications. Estimated blood loss:                            None. Estimated Blood Loss:     Estimated blood loss: none. Impression:               - Diverticulosis in the left colon and in the right                            colon. There was moderate sigmoid stenosis.                           - The examination was otherwise normal on direct                            and retroflexion views.                           - No specimens collected. Recommendation:           - Repeat colonoscopy in 5 years for surveillance                            (family history). PEDIATRIC SCOPE preferable                           - Patient has a contact number available for                            emergencies. The signs and symptoms of potential                             delayed complications were discussed with the                            patient. Return to normal activities tomorrow.                            Written discharge instructions were provided to the                            patient.                           - Resume previous diet.                           - Continue present medications. Docia Chuck. Henrene Pastor, MD 09/22/2020 9:02:14 AM This report has been signed electronically.

## 2020-09-25 ENCOUNTER — Encounter: Payer: Self-pay | Admitting: Family Medicine

## 2020-09-25 ENCOUNTER — Encounter: Payer: Self-pay | Admitting: Obstetrics and Gynecology

## 2020-09-26 ENCOUNTER — Encounter: Payer: Self-pay | Admitting: Family Medicine

## 2020-09-26 ENCOUNTER — Telehealth: Payer: Self-pay | Admitting: *Deleted

## 2020-09-26 NOTE — Telephone Encounter (Signed)
Patient needs OV with Dr.Silva for pessary fitting.

## 2020-09-26 NOTE — Telephone Encounter (Signed)
Follow up call made. 

## 2020-09-26 NOTE — Telephone Encounter (Signed)
Spoke to patient, she received a call from Dr Fredrich Romans (OB-GYN) and was given an appt for 10/18/20 @ 4:00 pm.  Dm/cma

## 2020-09-26 NOTE — Telephone Encounter (Signed)
  Follow up Call-  Call back number 09/22/2020  Post procedure Call Back phone  # [phone]  Permission to leave phone message No  Some recent data might be hidden    Pt returned our call and states she is "feeling precious... thank you for calling to check on me."  Patient questions:  Do you have a fever, pain , or abdominal swelling? No. Pain Score  0 *  Have you tolerated food without any problems? Yes.    Have you been able to return to your normal activities? Yes.    Do you have any questions about your discharge instructions: Diet   No. Medications  No. Follow up visit  No.  Do you have questions or concerns about your Care? No.  Actions: * If pain score is 4 or above: No action needed, pain <4.  Have you developed a fever since your procedure? no  2.   Have you had an respiratory symptoms (SOB or cough) since your procedure? no  3.   Have you tested positive for COVID 19 since your procedure no  4.   Have you had any family members/close contacts diagnosed with the COVID 19 since your procedure?  no   If yes to any of these questions please route to Joylene John, RN and Joella Prince, RN

## 2020-10-02 ENCOUNTER — Ambulatory Visit: Payer: PPO | Admitting: Family Medicine

## 2020-10-12 ENCOUNTER — Ambulatory Visit (INDEPENDENT_AMBULATORY_CARE_PROVIDER_SITE_OTHER): Payer: PPO | Admitting: Family Medicine

## 2020-10-12 ENCOUNTER — Encounter: Payer: Self-pay | Admitting: Family Medicine

## 2020-10-12 ENCOUNTER — Other Ambulatory Visit: Payer: Self-pay

## 2020-10-12 VITALS — BP 128/78 | HR 96 | Temp 97.4°F | Ht 61.0 in | Wt 173.8 lb

## 2020-10-12 DIAGNOSIS — I10 Essential (primary) hypertension: Secondary | ICD-10-CM

## 2020-10-12 DIAGNOSIS — J4520 Mild intermittent asthma, uncomplicated: Secondary | ICD-10-CM | POA: Diagnosis not present

## 2020-10-12 DIAGNOSIS — E669 Obesity, unspecified: Secondary | ICD-10-CM

## 2020-10-12 DIAGNOSIS — M8589 Other specified disorders of bone density and structure, multiple sites: Secondary | ICD-10-CM

## 2020-10-12 DIAGNOSIS — N816 Rectocele: Secondary | ICD-10-CM | POA: Diagnosis not present

## 2020-10-12 DIAGNOSIS — F411 Generalized anxiety disorder: Secondary | ICD-10-CM

## 2020-10-12 DIAGNOSIS — E663 Overweight: Secondary | ICD-10-CM | POA: Insufficient documentation

## 2020-10-12 NOTE — Progress Notes (Signed)
[facility] [address] [place] [address] Dept: [phone] Dept Fax: [phone]  Chronic Care Office Visit  Subjective:    Patient ID: Kimberly [name], female    DOB: Jun 07, 1950, 70 y.o..   MRN: [id number]  Chief Complaint  Patient presents with   Follow-up    3 mo f/u    History of Present Illness:  Patient is in today for reassessment of chronic medical issues.  [name] had a concern for  a possible hip injury at her last visit. She was seen by orthopedics. They determined that her issue was sciatica and not related to a fracture. She notes her hip area is much better at this point.   [name] has a history of anxiety and is managed on bupropion and occasional Xanax. She notes that overall she has been feeling unhappy. She recognizes some of this was pre-existing when she lived in [place]. In some ways, she feels like when she moved, she should have moved closer to friends that she has in the [place] area. She is finding her network here is not as big as she would like. She also has dissatisfaction with her weight. She feels she has made significant changes to her diet and still struggles with no losing.  [name] has a history of hypertension and is managed on metoprolol. She has hypothyroidism and is on levothyroxine.  [name] has a history of asthma. She notes her breathing recently was improved. She has not been using her inhaler very often.   [name] has a history of a pelvic organ prolapse. She was offered a pessary by a GYN, but refused. Now, she is reconsidering taking this approach.  Past Medical History: Patient Active Problem List   Diagnosis Date Noted   Obesity (BMI 30.0-34.9) 10/12/2020   Hiatal hernia 09/22/2020   GERD (gastroesophageal reflux disease) 09/22/2020   Diverticulosis 09/22/2020   Osteopenia 09/08/2020   Restless leg syndrome 06/05/2020   Pelvic organ prolapse  quantification stage 2 rectocele 05/31/2020   Hypothyroidism 03/29/2020   Essential hypertension 03/29/2020   Kidney stones 03/29/2020   Asthma 03/29/2020   Anxiety disorder 03/29/2020   Past Surgical History:  Procedure Laterality Date   PARTIAL HYSTERECTOMY  1980   Pre-cancerous condition. Failed laser surgery. still have ovaries   UMBILICAL HERNIA REPAIR     with mesh   WRIST SURGERY Left 2019   Family History  Problem Relation Age of Onset   Diabetes Mother    Breast cancer Mother    Colon cancer Father    Kidney failure Father    COPD Brother    Diabetes Brother    Heart disease Brother    Heart disease Maternal Uncle    Heart disease Daughter    Depression Son    Esophageal cancer Neg Hx    Stomach cancer Neg Hx    Rectal cancer Neg Hx    Outpatient Medications Prior to Visit  Medication Sig Dispense Refill   albuterol (VENTOLIN HFA) 108 (90 Base) MCG/ACT inhaler SMARTSIG:2 Puff(s) By Mouth Every 4 Hours PRN 1 each 3   aspirin 325 MG tablet Take 81 mg by mouth daily.     buPROPion (WELLBUTRIN SR) 150 MG 12 hr tablet TAKE 1 TABLET(150 MG) BY MOUTH TWICE DAILY 180 tablet 3   cholecalciferol (VITAMIN D3) 25 MCG (1000 UNIT) tablet      Cranberry 1000 MG CAPS Take by mouth.     levothyroxine (SYNTHROID)  88 MCG tablet Take 1 tablet (88 mcg total) by mouth daily. 90 tablet 3   metoprolol succinate (TOPROL-XL) 25 MG 24 hr tablet TAKE 1 TABLET(25 MG) BY MOUTH DAILY 90 tablet 3   promethazine (PHENERGAN) 25 MG tablet Take 25 mg by mouth every 6 (six) hours as needed for nausea or vomiting.     rOPINIRole (REQUIP) 1 MG tablet Take 1 tablet (1 mg total) by mouth at bedtime. 90 tablet 3   Facility-Administered Medications Prior to Visit  Medication Dose Route Frequency Provider Last Rate Last Admin   0.9 %  sodium chloride infusion  500 mL Intravenous Once Irene Shipper, MD        Allergies  Allergen Reactions   Codeine Hives   Sulfa Antibiotics Hives and Itching    Objective:   Today's Vitals   10/12/20 0808  BP: 128/78  Pulse: 96  Temp: (!) 97.4 F (36.3 C)  TempSrc: Temporal  SpO2: (!) 65%  Weight: 173 lb 12.8 oz (78.8 kg)  Height: '5\' 1"'$  (1.549 m)   Body mass index is 32.84 kg/m.   General: Well developed, well nourished. No acute distress. Lungs: Clear to auscultation bilaterally. No wheezing, rales or rhonchi. Psych: Alert and oriented. Normal mood and affect.  Health Maintenance Due  Topic Date Due   Hepatitis C Screening  Never done   Zoster Vaccines- Shingrix (1 of 2) Never done   MAMMOGRAM  Never done   PNA vac Low Risk Adult (1 of 2 - PCV13) Never done   COVID-19 Vaccine (3 - Pfizer risk series) 07/11/2019   INFLUENZA VACCINE  10/02/2020   Imaging: Bone Density (7/56/2022) Results:   Lumbar spine L1-L4(L3) Femoral neck (FN)  T-score -2.3 RFN: -2.1 LFN: -2.2    Assessment: By the [facility] Criteria for diagnosis based on bone density, this patient has Low Bone Density    Z Score compares the patients bone density to age, sex, and race matched controls.  Compared to age, sex, and race matched controls, this patient's bone density is Average   FRAX 10-year fracture risk calculator: 19 % for any major fracture and 3.8 % for hip fracture. Pharmacologic therapy is recommended if 10 year fracture risk is >20% for any major osteoporotic fracture or >3% for hip fracture.   L3 vertebra had to be excluded from analysis due to DJD    Assessment & Plan:   1. Essential hypertension Blood pressure is at goal today. Continue metoprolol.  2. Obesity (BMI 30.0-34.9) We discussed approaches to weight loss. I recommend referral for medical weight management.  - Amb Ref to Medical Weight Management  3. Mild intermittent asthma, unspecified whether complicated Lungs are clear currently. Continue albuterol as needed.  4. Pelvic organ prolapse quantification stage 2 rectocele We discussed risk/benefits of pessary use vs. surgical  approaches to prolapse. she is considering returning to GYN for pessary sizing and placement.  5. Generalized anxiety disorder [name] has a historyof anxiety and now has some mild depressive features. We discussed ongoign use of bupropion and routine exercise. I offered counseling, but she declined at this point.  6. Osteopenia of multiple sites Recent Dexa shows low bone density. Her FRAX score is not high enough to warrant pharmacotherapy. We will repeat this in 2 years.  I spent 25 min. reviewing the chart, taking an interval history, reviewing DEXA scan results, conducting a limited physical exam, discuss a plan of care and documenting.  [name], MD

## 2020-10-17 NOTE — Progress Notes (Signed)
GYNECOLOGY  VISIT   HPI: 70 y.o.   Widowed  Caucasian  female   G2P2 with No LMP recorded. Patient has had a hysterectomy.   here for pessary fitting.    First degree cystocele and vaginal vault prolapse, second degree rectocele at her visit on 05/30/20.  She feels like the prolapse is progressive.  No leakage of urine.  She is double voiding.  Can have leakage of stool.  Takes Metamucil daily.   Had her endoscopy and colonoscopy done.  She has a peptic stricture and a hiatal hernia.  She has multiple diverticula and sigmoid stenosis.  She thinks she is working toward surgical care eventually as she would like definitive treatment.   GYNECOLOGIC HISTORY: No LMP recorded. Patient has had a hysterectomy. Contraception: Hyst Menopausal hormone therapy:  None Last mammogram: 06/2019 normal in [place] Last pap smear: 2000 normal per patient        OB History     Gravida  2   Para  2   Term      Preterm      AB      Living  2      SAB      IAB      Ectopic      Multiple      Live Births                 Patient Active Problem List   Diagnosis Date Noted   Obesity (BMI 30.0-34.9) 10/12/2020   Hiatal hernia 09/22/2020   GERD (gastroesophageal reflux disease) 09/22/2020   Diverticulosis 09/22/2020   Osteopenia 09/08/2020   Restless leg syndrome 06/05/2020   Pelvic organ prolapse quantification stage 2 rectocele 05/31/2020   Hypothyroidism 03/29/2020   Essential hypertension 03/29/2020   Kidney stones 03/29/2020   Asthma 03/29/2020   Anxiety disorder 03/29/2020    Past Medical History:  Diagnosis Date   Anxiety    Asthma    Barrett esophagus    Chronic headaches    Colon polyps    COVID 03/2020   Depression    GERD (gastroesophageal reflux disease)    Hypertension    Kidney stones    Pneumonia    Thyroid disease    Uterine cancer ([place])     Past Surgical History:  Procedure Laterality Date   PARTIAL HYSTERECTOMY  1980   Pre-cancerous  condition. Failed laser surgery. still have ovaries   UMBILICAL HERNIA REPAIR     with mesh   WRIST SURGERY Left 2019    Current Outpatient Medications  Medication Sig Dispense Refill   albuterol (VENTOLIN HFA) 108 (90 Base) MCG/ACT inhaler SMARTSIG:2 Puff(s) By Mouth Every 4 Hours PRN 1 each 3   aspirin 81 MG chewable tablet Chew 81 mg by mouth daily.     buPROPion (WELLBUTRIN SR) 150 MG 12 hr tablet TAKE 1 TABLET(150 MG) BY MOUTH TWICE DAILY 180 tablet 3   cholecalciferol (VITAMIN D3) 25 MCG (1000 UNIT) tablet      Cranberry 1000 MG CAPS Take by mouth.     levothyroxine (SYNTHROID) 88 MCG tablet Take 1 tablet (88 mcg total) by mouth daily. 90 tablet 3   metoprolol succinate (TOPROL-XL) 25 MG 24 hr tablet TAKE 1 TABLET(25 MG) BY MOUTH DAILY 90 tablet 3   promethazine (PHENERGAN) 25 MG tablet Take 25 mg by mouth every 6 (six) hours as needed for nausea or vomiting.     rOPINIRole (REQUIP) 1 MG tablet Take 1 tablet (1 mg  total) by mouth at bedtime. 90 tablet 3   Current Facility-Administered Medications  Medication Dose Route Frequency Provider Last Rate Last Admin   0.9 %  sodium chloride infusion  500 mL Intravenous Once Irene Shipper, MD         ALLERGIES: Codeine and Sulfa antibiotics  Family History  Problem Relation Age of Onset   Diabetes Mother    Breast cancer Mother    Colon cancer Father    Kidney failure Father    COPD Brother    Diabetes Brother    Heart disease Brother    Heart disease Maternal Uncle    Heart disease Daughter    Depression Son    Esophageal cancer Neg Hx    Stomach cancer Neg Hx    Rectal cancer Neg Hx     Social History   Socioeconomic History   Marital status: Widowed    Spouse name: Richard   Number of children: 2   Years of education: 12 years   Highest education level: High school graduate  Occupational History   Occupation: retired    Comment: Retired  Tobacco Use   Smoking status: Former    Types: Cigarettes    Quit date:  1995    Years since quitting: 27.6   Smokeless tobacco: Never  Vaping Use   Vaping Use: Never used  Substance and Sexual Activity   Alcohol use: Yes    Alcohol/week: 3.0 standard drinks    Types: 3 Standard drinks or equivalent per week    Comment: 1 per day   Drug use: Never   Sexual activity: Not Currently    Birth control/protection: Surgical    Comment: Hyst  Other Topics Concern   Not on file  Social History Narrative   Recently moved from [place] to [place]. 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   Social Determinants of Health   Financial Resource Strain: Not on file  Food Insecurity: Not on file  Transportation Needs: Not on file  Physical Activity: Not on file  Stress: Not on file  Social Connections: Not on file  Intimate Partner Violence: Not on file    Review of Systems  All other systems reviewed and are negative.  PHYSICAL EXAMINATION:    BP 128/80   Pulse 64   Ht '5\' 2"'$  (1.575 m)   Wt 169 lb (76.7 kg)   SpO2 97%   BMI 30.91 kg/m     General appearance: alert, cooperative and appears stated age  Pelvic: External genitalia:  no lesions              Urethra:  normal appearing urethra with no masses, tenderness or lesions              Bartholins and Skenes: normal                 Vagina: normal appearing vagina with normal color and discharge, no lesions.  First degree cystocele and vaginal vault prolapse, second to third degree rectocele.  Suspect enterocele.              Cervix;  absent.                Bimanual Exam:  Uterus:  absent.               Adnexa: no mass, fullness, tenderness              Rectal exam: Yes.  .  Confirms.  Anus:  normal sphincter tone, no lesions  Patient examined in lithotomy and standing position.   #3 ring with support comfortable and maintained with multiple maneuvers of sitting, Valsalva, standing, heal bounces, walking, etc. #4 ring with support uncomfortable.   Chaperone was present  for exam:  Estill Bamberg.  ASSESSMENT  Status post total vaginal hysterectomy.  Ovaries remain.  Pelvic organ prolapse.  Rectocele and possible enterocele.   Some cystocele and vaginal vault prolapse.  Difficulty voiding.  Constipation.  Status post umbilical herniorrhaphy with mesh placement.   PLAN  #3 ring support fitted and placed in patient.  Lot [id number], exp 11/01/29.  Comfortable with maneuvers and able to maintain.  Pessary instructions given.  (Pessary fell out when patient voided after completing her visit.  Pessary given to patient in a biobag.) Return in one week for a fitting with a potential Gelhorn pessary.   An After Visit Summary was printed and given to the patient.

## 2020-10-18 ENCOUNTER — Ambulatory Visit: Payer: PPO | Admitting: Obstetrics and Gynecology

## 2020-10-18 ENCOUNTER — Encounter: Payer: Self-pay | Admitting: Obstetrics and Gynecology

## 2020-10-18 ENCOUNTER — Other Ambulatory Visit: Payer: Self-pay

## 2020-10-18 VITALS — BP 128/80 | HR 64 | Ht 62.0 in | Wt 169.0 lb

## 2020-10-18 DIAGNOSIS — N8110 Cystocele, unspecified: Secondary | ICD-10-CM

## 2020-10-18 DIAGNOSIS — N816 Rectocele: Secondary | ICD-10-CM

## 2020-10-18 DIAGNOSIS — N819 Female genital prolapse, unspecified: Secondary | ICD-10-CM | POA: Diagnosis not present

## 2020-10-18 NOTE — Patient Instructions (Signed)

## 2020-10-19 ENCOUNTER — Encounter: Payer: Self-pay | Admitting: Obstetrics and Gynecology

## 2020-10-20 NOTE — Telephone Encounter (Signed)
Message sent to [facility] appointment pool for scheduling

## 2020-10-20 NOTE — Telephone Encounter (Unsigned)
Please schedule a follow up appointment with me.   We need to discuss a surgical plan and preparatory testing, urodynamics with a pessary to reduce the prolapse for the test itself.   Please make sure she does not throw away the pessary given to her to other day.

## 2020-10-30 ENCOUNTER — Ambulatory Visit: Payer: PPO | Admitting: Obstetrics and Gynecology

## 2020-10-30 NOTE — Progress Notes (Signed)
GYNECOLOGY  VISIT   HPI: 70 y.o.   Widowed  Caucasian  female   G2P2 with No LMP recorded. Patient has had a hysterectomy.   here to discuss surgery.  States her prolapse and urinary incontinence are progressive.   She got out of the car recently and had a large amount of leakage of urine.   Patient having urinary accidents occasionally and fecal incontinence.  Does not leak with cough, laugh, sneeze, or exercise.  She had stress incontinence about 5 years ago.  Currently she has urgency to void "for a long time." DF - every hour.  NF - none.  She is having difficulty completing her bowel movements.  She is straining a lot.  She fecal incontinence once every 2 weeks.  She is not having awareness of this and suddenly she notes soreness and then she check and finds the soiling of stool.  She is not taking Metamucil.   Vaginal delivery, 9 pound, 13 ounces, uncertain level of laceration.  1971.   Vaginal delivery, 8 pounds, 7 ounces, uncertain if had laceration.  1974.   GYNECOLOGIC HISTORY: No LMP recorded. Patient has had a hysterectomy. Contraception: Hyst Menopausal hormone therapy:  None Last mammogram:  06/2019 normal in [place] Last pap smear:  2000 normal per patient        OB History     Gravida  2   Para  2   Term      Preterm      AB      Living  2      SAB      IAB      Ectopic      Multiple      Live Births                 Patient Active Problem List   Diagnosis Date Noted   Obesity (BMI 30.0-34.9) 10/12/2020   Hiatal hernia 09/22/2020   GERD (gastroesophageal reflux disease) 09/22/2020   Diverticulosis 09/22/2020   Osteopenia 09/08/2020   Restless leg syndrome 06/05/2020   Pelvic organ prolapse quantification stage 2 rectocele 05/31/2020   Hypothyroidism 03/29/2020   Essential hypertension 03/29/2020   Kidney stones 03/29/2020   Asthma 03/29/2020   Anxiety disorder 03/29/2020    Past Medical History:  Diagnosis Date    Anxiety    Asthma    Barrett esophagus    Chronic headaches    Colon polyps    COVID 03/2020   Depression    GERD (gastroesophageal reflux disease)    Hypertension    Kidney stones    Pneumonia    Thyroid disease    Uterine cancer ([place])     Past Surgical History:  Procedure Laterality Date   PARTIAL HYSTERECTOMY  1980   Pre-cancerous condition. Failed laser surgery. still have ovaries   UMBILICAL HERNIA REPAIR     with mesh   WRIST SURGERY Left 2019    Current Outpatient Medications  Medication Sig Dispense Refill   albuterol (VENTOLIN HFA) 108 (90 Base) MCG/ACT inhaler SMARTSIG:2 Puff(s) By Mouth Every 4 Hours PRN 1 each 3   aspirin 81 MG chewable tablet Chew 81 mg by mouth daily.     buPROPion (WELLBUTRIN SR) 150 MG 12 hr tablet TAKE 1 TABLET(150 MG) BY MOUTH TWICE DAILY 180 tablet 3   cholecalciferol (VITAMIN D3) 25 MCG (1000 UNIT) tablet      Cranberry 1000 MG CAPS Take by mouth.     diazepam (VALIUM) 5 MG  tablet Take 5 mg by mouth 2 (two) times daily as needed.     levothyroxine (SYNTHROID) 88 MCG tablet Take 1 tablet (88 mcg total) by mouth daily. 90 tablet 3   metoprolol succinate (TOPROL-XL) 25 MG 24 hr tablet TAKE 1 TABLET(25 MG) BY MOUTH DAILY 90 tablet 3   promethazine (PHENERGAN) 25 MG tablet Take 25 mg by mouth every 6 (six) hours as needed for nausea or vomiting.     rOPINIRole (REQUIP) 1 MG tablet Take 1 tablet (1 mg total) by mouth at bedtime. 90 tablet 3   Current Facility-Administered Medications  Medication Dose Route Frequency Provider Last Rate Last Admin   0.9 %  sodium chloride infusion  500 mL Intravenous Once Irene Shipper, MD         ALLERGIES: Codeine and Sulfa antibiotics  Family History  Problem Relation Age of Onset   Diabetes Mother    Breast cancer Mother    Colon cancer Father    Kidney failure Father    COPD Brother    Diabetes Brother    Heart disease Brother    Heart disease Maternal Uncle    Heart disease Daughter     Depression Son    Esophageal cancer Neg Hx    Stomach cancer Neg Hx    Rectal cancer Neg Hx     Social History   Socioeconomic History   Marital status: Widowed    Spouse name: Richard   Number of children: 2   Years of education: 12 years   Highest education level: High school graduate  Occupational History   Occupation: retired    Comment: Retired  Tobacco Use   Smoking status: Former    Types: Cigarettes    Quit date: 1995    Years since quitting: 27.6   Smokeless tobacco: Never  Vaping Use   Vaping Use: Never used  Substance and Sexual Activity   Alcohol use: Yes    Alcohol/week: 3.0 standard drinks    Types: 3 Standard drinks or equivalent per week    Comment: 1 per day   Drug use: Never   Sexual activity: Not Currently    Birth control/protection: Surgical    Comment: Hyst  Other Topics Concern   Not on file  Social History Narrative   Recently moved from [place] to [place]. 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   Social Determinants of Health   Financial Resource Strain: Not on file  Food Insecurity: Not on file  Transportation Needs: Not on file  Physical Activity: Not on file  Stress: Not on file  Social Connections: Not on file  Intimate Partner Violence: Not on file    Review of Systems  Gastrointestinal:        Fecal incontinence  Genitourinary:        Incontinence  All other systems reviewed and are negative.  PHYSICAL EXAMINATION:    BP (!) 150/78   Pulse 70   Ht '5\' 2"'$  (1.575 m)   Wt 169 lb (76.7 kg)   SpO2 97%   BMI 30.91 kg/m     General appearance: alert, cooperative and appears stated age  Pelvic: External genitalia:  no lesions              Urethra:  normal appearing urethra with no masses, tenderness or lesions              Bartholins and Skenes: normal  Vagina: normal appearing vagina with normal color and discharge, no lesions.  Third degree rectocele.  I suspect she has a component of an  enterocele also.  Vaginal vault with first degree prolapse.  Minimal cystocele.               Cervix: absent                Bimanual Exam:  Uterus:  absent              Adnexa: no mass, fullness, tenderness              Rectal exam: yes.  Confirms.              Anus:  normal sphincter tone, no lesions  Chaperone was present for exam:  Estill Bamberg, CMA.  ASSESSMENT  Urinary urgency.  May have mixed urinary incontinence.  Rectocele.  Possible enterocele. Fecal incontinence.  PLAN  We discussed urinary incontinence.  Metamucil for fecal incontinence.  I would recommend referral to Dr. Maryland Pink for evaluation and treatment of prolapse and incontinence.  We discussed goals of surgery to correct anatomy and functionality of the bladder and rectum.  I briefly explained urodynamic testing.  Fu prn.    An After Visit Summary was printed and given to the patient.   40 min total time was spent for this patient encounter, including preparation, face-to-face counseling with the patient, coordination of care, and documentation of the encounter.

## 2020-10-31 ENCOUNTER — Encounter: Payer: Self-pay | Admitting: Obstetrics and Gynecology

## 2020-10-31 ENCOUNTER — Telehealth: Payer: Self-pay | Admitting: Obstetrics and Gynecology

## 2020-10-31 ENCOUNTER — Other Ambulatory Visit: Payer: Self-pay

## 2020-10-31 ENCOUNTER — Ambulatory Visit: Payer: PPO | Admitting: Obstetrics and Gynecology

## 2020-10-31 VITALS — BP 150/78 | HR 70 | Ht 62.0 in | Wt 169.0 lb

## 2020-10-31 DIAGNOSIS — R159 Full incontinence of feces: Secondary | ICD-10-CM

## 2020-10-31 DIAGNOSIS — N816 Rectocele: Secondary | ICD-10-CM | POA: Diagnosis not present

## 2020-10-31 DIAGNOSIS — R3915 Urgency of urination: Secondary | ICD-10-CM | POA: Diagnosis not present

## 2020-10-31 NOTE — Telephone Encounter (Signed)
Please make a referral to Dr. Maryland Pink at [facility] for pelvic organ prolapse, mixed urinary incontinence, and fecal incontinence.

## 2020-10-31 NOTE — Telephone Encounter (Signed)
Patient scheduled on 03/22/2021 @ 12:45pm office notes faxed to [phone].  Left message for patient to call.

## 2020-11-03 NOTE — Telephone Encounter (Signed)
I am recommending consultation with Dr. Zigmund Daniel for surgical consultation, which I see is not until January.   I am not sure who made the referral to [facility].  I would recommend she keep the GI appointment as they may be able to assist her further while she is awaiting consultation with Dr. Zigmund Daniel.

## 2020-11-03 NOTE — Telephone Encounter (Signed)
Left message for patient to call.

## 2020-11-03 NOTE — Telephone Encounter (Signed)
I called patient to relay the below note and she is very confused. States you wanted her to see 1 doctor to handle all the below. Patient is scheduled with Noralyn Pick, NP on 12/05/2020 @ 9:30am which is with [facility]. I eplained to patient who/ what Dr.Mattews does, however patinet is sure this is not what you told her. I told her I would check with you. Please advsie

## 2020-11-23 ENCOUNTER — Other Ambulatory Visit: Payer: Self-pay | Admitting: Family Medicine

## 2020-11-23 DIAGNOSIS — J4520 Mild intermittent asthma, uncomplicated: Secondary | ICD-10-CM

## 2020-11-29 ENCOUNTER — Other Ambulatory Visit: Payer: PPO

## 2020-12-05 ENCOUNTER — Ambulatory Visit: Payer: PPO | Admitting: Nurse Practitioner

## 2021-01-12 ENCOUNTER — Ambulatory Visit (INDEPENDENT_AMBULATORY_CARE_PROVIDER_SITE_OTHER): Payer: PPO | Admitting: Family Medicine

## 2021-01-12 ENCOUNTER — Other Ambulatory Visit: Payer: Self-pay

## 2021-01-12 ENCOUNTER — Telehealth: Payer: Self-pay | Admitting: Family Medicine

## 2021-01-12 ENCOUNTER — Encounter: Payer: Self-pay | Admitting: Family Medicine

## 2021-01-12 VITALS — BP 120/84 | HR 71 | Temp 97.4°F | Ht 62.0 in | Wt 176.8 lb

## 2021-01-12 DIAGNOSIS — Z1231 Encounter for screening mammogram for malignant neoplasm of breast: Secondary | ICD-10-CM

## 2021-01-12 DIAGNOSIS — R5383 Other fatigue: Secondary | ICD-10-CM | POA: Diagnosis not present

## 2021-01-12 DIAGNOSIS — E039 Hypothyroidism, unspecified: Secondary | ICD-10-CM | POA: Diagnosis not present

## 2021-01-12 DIAGNOSIS — G2581 Restless legs syndrome: Secondary | ICD-10-CM | POA: Diagnosis not present

## 2021-01-12 DIAGNOSIS — H6121 Impacted cerumen, right ear: Secondary | ICD-10-CM

## 2021-01-12 DIAGNOSIS — K219 Gastro-esophageal reflux disease without esophagitis: Secondary | ICD-10-CM | POA: Diagnosis not present

## 2021-01-12 DIAGNOSIS — F411 Generalized anxiety disorder: Secondary | ICD-10-CM

## 2021-01-12 DIAGNOSIS — N816 Rectocele: Secondary | ICD-10-CM | POA: Diagnosis not present

## 2021-01-12 DIAGNOSIS — I10 Essential (primary) hypertension: Secondary | ICD-10-CM | POA: Diagnosis not present

## 2021-01-12 LAB — BASIC METABOLIC PANEL
BUN: 12 mg/dL (ref 6–23)
CO2: 31 mEq/L (ref 19–32)
Calcium: 9.3 mg/dL (ref 8.4–10.5)
Chloride: 105 mEq/L (ref 96–112)
Creatinine, Ser: 0.69 mg/dL (ref 0.40–1.20)
GFR: 88.7 mL/min (ref >60.00)
Glucose, Bld: 86 mg/dL (ref 70–99)
Potassium: 4.5 mEq/L (ref 3.5–5.1)
Sodium: 141 mEq/L (ref 135–145)

## 2021-01-12 LAB — LIPID PANEL
Cholesterol: 202 mg/dL — ABNORMAL HIGH (ref 0–200)
HDL: 65.3 mg/dL (ref >39.00)
LDL Cholesterol: 106 mg/dL — ABNORMAL HIGH (ref 0–99)
NonHDL: 136.47
Total CHOL/HDL Ratio: 3
Triglycerides: 153 mg/dL — ABNORMAL HIGH (ref 0.0–149.0)
VLDL: 30.6 mg/dL (ref 0.0–40.0)

## 2021-01-12 LAB — CBC
HCT: 38 % (ref 36.0–46.0)
Hemoglobin: 12.4 g/dL (ref 12.0–15.0)
MCHC: 32.6 g/dL (ref 30.0–36.0)
MCV: 85.5 fl (ref 78.0–100.0)
Platelets: 175 10*3/uL (ref 150.0–400.0)
RBC: 4.45 Mil/uL (ref 3.87–5.11)
RDW: 14.6 % (ref 11.5–15.5)
WBC: 5.4 10*3/uL (ref 4.0–10.5)

## 2021-01-12 LAB — TSH: TSH: 6.06 u[IU]/mL — ABNORMAL HIGH (ref 0.35–5.50)

## 2021-01-12 LAB — VITAMIN D 25 HYDROXY (VIT D DEFICIENCY, FRACTURES): VITD: 35.68 ng/mL (ref 30.00–100.00)

## 2021-01-12 MED ORDER — PROMETHAZINE HCL 25 MG PO TABS: 25.0000 mg | ORAL_TABLET | Freq: Four times a day (QID) | ORAL | 2 refills | Status: DC | PRN

## 2021-01-12 MED ORDER — ROPINIROLE HCL 0.5 MG PO TABS: 0.5000 mg | ORAL_TABLET | Freq: Every day | ORAL | 5 refills | Status: DC

## 2021-01-12 MED ORDER — LEVOTHYROXINE SODIUM 100 MCG PO TABS: 100.0000 ug | ORAL_TABLET | Freq: Every day | ORAL | 3 refills | Status: DC

## 2021-01-12 MED ORDER — DIAZEPAM 5 MG PO TABS: 5.0000 mg | ORAL_TABLET | Freq: Two times a day (BID) | ORAL | 1 refills | Status: DC | PRN

## 2021-01-12 MED ORDER — VENLAFAXINE HCL ER 37.5 MG PO CP24: 37.5000 mg | ORAL_CAPSULE | Freq: Every day | ORAL | 5 refills | Status: DC

## 2021-01-12 NOTE — Telephone Encounter (Signed)
Lft VM that order is being placed and someone should reach out to her if she hasn't heard anything in a week to call back to check on it.  Dm/cma

## 2021-01-12 NOTE — Telephone Encounter (Signed)
Kimberly [name] wanted to know about getting scheduled for a mammogram and was not sure who to call.

## 2021-01-12 NOTE — Progress Notes (Signed)
[facility] [address] [place] [address] Dept: [phone] Dept Fax: [phone]  Chronic Care Office Visit  Subjective:    Patient ID: Kimberly [name], female    DOB: 07-06-1950, 70 y.o..   MRN: [id number]  Chief Complaint  Patient presents with   Follow-up    3 month f/u,  c/o being off Valium x 1 month (having N&V), weight.      History of Present Illness:  Patient is in today for reassessment of chronic medical issues.  [name] had a recent river cruise in [place]. She returned earlier this week and enjoyed her vacation.  [name] notes some irritation in her right outer ear. She notes she used earphones while travelling and thins this may have irritated that.  [name] notes she has a small bump on her right upper lip for the past several days.   [name] has a history of anxiety and is managed on bupropion and occasional Xanax. She wonders about increasing her Valium to take 3 times a day. She notes that she has issues with getting very upset and stressed. She then can have sudden vomiting brought on by her emotional state.   [name] has a history of hypertension and is managed on metoprolol.   [name] has hypothyroidism and is on levothyroxine.   [name] has a history of a pelvic organ prolapse. She has been referred by Dr. Judeth Horn to see a specialist in Jan. for consideration of surgical approaches for this.  [name] has a history of restless legs. She feels her current dose of Mirapex is making her too drowsy. She would like to lower her dose.  Past Medical History: Patient Active Problem List   Diagnosis Date Noted   Obesity (BMI 30.0-34.9) 10/12/2020   Hiatal hernia 09/22/2020   GERD (gastroesophageal reflux disease) 09/22/2020   Diverticulosis 09/22/2020   Osteopenia 09/08/2020   Restless leg syndrome 06/05/2020   Pelvic organ prolapse quantification stage 2  rectocele 05/31/2020   Hypothyroidism 03/29/2020   Essential hypertension 03/29/2020   Kidney stones 03/29/2020   Asthma 03/29/2020   Anxiety disorder 03/29/2020   Past Surgical History:  Procedure Laterality Date   PARTIAL HYSTERECTOMY  1980   Pre-cancerous condition. Failed laser surgery. still have ovaries   UMBILICAL HERNIA REPAIR     with mesh   WRIST SURGERY Left 2019   Family History  Problem Relation Age of Onset   Diabetes Mother    Breast cancer Mother    Colon cancer Father    Kidney failure Father    COPD Brother    Diabetes Brother    Heart disease Brother    Heart disease Maternal Uncle    Heart disease Daughter    Depression Son    Esophageal cancer Neg Hx    Stomach cancer Neg Hx    Rectal cancer Neg Hx    Outpatient Medications Prior to Visit  Medication Sig Dispense Refill   albuterol (VENTOLIN HFA) 108 (90 Base) MCG/ACT inhaler INHALE 2 PUFFS BY MOUTH EVERY 4 HOURS AS NEEDED 8.5 g 3   aspirin 81 MG chewable tablet Chew 81 mg by mouth daily.     buPROPion (WELLBUTRIN SR) 150 MG 12 hr tablet TAKE 1 TABLET(150 MG) BY MOUTH TWICE DAILY 180 tablet 3   cholecalciferol (VITAMIN D3) 25 MCG (1000 UNIT) tablet      Cranberry 1000 MG CAPS Take by mouth.     levothyroxine (SYNTHROID)  88 MCG tablet Take 1 tablet (88 mcg total) by mouth daily. 90 tablet 3   metoprolol succinate (TOPROL-XL) 25 MG 24 hr tablet TAKE 1 TABLET(25 MG) BY MOUTH DAILY 90 tablet 3   promethazine (PHENERGAN) 25 MG tablet Take 25 mg by mouth every 6 (six) hours as needed for nausea or vomiting.     rOPINIRole (REQUIP) 1 MG tablet Take 1 tablet (1 mg total) by mouth at bedtime. 90 tablet 3   diazepam (VALIUM) 5 MG tablet Take 5 mg by mouth 2 (two) times daily as needed. (Patient not taking: Reported on 01/12/2021)     Facility-Administered Medications Prior to Visit  Medication Dose Route Frequency Provider Last Rate Last Admin   0.9 %  sodium chloride infusion  500 mL Intravenous Once Irene Shipper, MD       Allergies  Allergen Reactions   Codeine Hives   Sulfa Antibiotics Hives and Itching      Objective:   Today's Vitals   01/12/21 0832  BP: 120/84  Pulse: 71  Temp: (!) 97.4 F (36.3 C)  TempSrc: Temporal  SpO2: 98%  Weight: 176 lb 12.8 oz (80.2 kg)  Height: 5\' 2"  (1.575 m)   Body mass index is 32.34 kg/m.   General: Well developed, well nourished. No acute distress. HEENT: Normocephalic, non-traumatic. External ears normal. Right EAC is obscured with wax.   There is a small raised papule with a central papilla on the right upper lip. Psych: Alert and oriented. Normal mood and affect.  Health Maintenance Due  Topic Date Due   Pneumonia Vaccine 87+ Years old (1 - PCV) Never done   Hepatitis C Screening  Never done   Zoster Vaccines- Shingrix (1 of 2) Never done   MAMMOGRAM  Never done   COVID-19 Vaccine (3 - Pfizer risk series) 07/11/2019   INFLUENZA VACCINE  Never done     PROCEDURE- Ear Wax Removal Indication: Impacted ear wax  PARQ reviewed with patient. Verbal consent obtained. Lighted curette used to remove wax. Patient tolerated procedure well.  Assessment & Plan:  1. Essential hypertension [name]'s blood pressure is at goal. Continue metoprolol. I will check labs to screen for kidney disease and hyperlipidemia.  - Basic metabolic panel - Lipid panel  2. Pelvic organ prolapse quantification stage 2 rectocele [name] is scheduled to see a urogynecologist in Jan. to discuss surgery for her prolapse.  3. Gastroesophageal reflux disease without esophagitis It is not clear if her vomiting is related to her reflux or due to anxiety. I will continue her promethazine.  - promethazine (PHENERGAN) 25 MG tablet; Take 1 tablet (25 mg total) by mouth every 6 (six) hours as needed for nausea or vomiting.  Dispense: 30 tablet; Refill: 2  4. Generalized anxiety disorder It appears that [name]'s anxiety is not optimally controlled with  bupropion. I recommended that we not increase her Valium, as I am concerned about fall risk. I will add a low dose of venlafaxine to see if we can decrease her anxiety and reduce her perceived need for rescue meds.  - diazepam (VALIUM) 5 MG tablet; Take 1 tablet (5 mg total) by mouth 2 (two) times daily as needed.  Dispense: 30 tablet; Refill: 1 - venlafaxine XR (EFFEXOR XR) 37.5 MG 24 hr capsule; Take 1 capsule (37.5 mg total) by mouth daily with breakfast.  Dispense: 30 capsule; Refill: 5  5. Restless leg syndrome We will try to decrease her dose to reduce drowsiness.  -  rOPINIRole (REQUIP) 0.5 MG tablet; Take 1 tablet (0.5 mg total) by mouth at bedtime.  Dispense: 30 tablet; Refill: 5  6. Fatigue, unspecified type Patient notes some overall fatigue. I will evaluate for possible causes. - VITAMIN D 25 Hydroxy (Vit-D Deficiency, Fractures) - CBC  7. Hypothyroidism, unspecified type Due for repeat TSH. This could also be a cause for fatigue, if her levothyroxine dose is too low. - TSH  8. Impacted cerumen of right ear Removed wax as noted above.  9. Encounter for screening mammogram for malignant neoplasm of breast  - MM DIGITAL SCREENING BILATERAL; Future  [name], MD

## 2021-01-12 NOTE — Telephone Encounter (Signed)
Lft VM to rtn call. Dm/cma  

## 2021-02-16 ENCOUNTER — Other Ambulatory Visit: Payer: Self-pay

## 2021-02-19 ENCOUNTER — Ambulatory Visit (INDEPENDENT_AMBULATORY_CARE_PROVIDER_SITE_OTHER): Payer: PPO | Admitting: Family Medicine

## 2021-02-19 ENCOUNTER — Other Ambulatory Visit: Payer: Self-pay

## 2021-02-19 VITALS — BP 134/80 | HR 85 | Temp 97.0°F | Ht 62.0 in | Wt 175.5 lb

## 2021-02-19 DIAGNOSIS — E039 Hypothyroidism, unspecified: Secondary | ICD-10-CM | POA: Diagnosis not present

## 2021-02-19 DIAGNOSIS — F411 Generalized anxiety disorder: Secondary | ICD-10-CM

## 2021-02-19 DIAGNOSIS — W19XXXA Unspecified fall, initial encounter: Secondary | ICD-10-CM | POA: Diagnosis not present

## 2021-02-19 DIAGNOSIS — G2581 Restless legs syndrome: Secondary | ICD-10-CM

## 2021-02-19 DIAGNOSIS — N816 Rectocele: Secondary | ICD-10-CM

## 2021-02-19 DIAGNOSIS — I10 Essential (primary) hypertension: Secondary | ICD-10-CM

## 2021-02-19 LAB — TSH: TSH: 3.07 u[IU]/mL (ref 0.35–5.50)

## 2021-02-19 NOTE — Progress Notes (Signed)
[facility] [address] [place] [address] Dept: [phone] Dept Fax: [phone]  Chronic Care Office Visit  Subjective:    Patient ID: Kimberly [name], female    DOB: [birth year], 70 y.o..   MRN: [id number]  Chief Complaint  Patient presents with   Follow-up    F/u meds.   C/o fell getting into bed 2 days ago and hurt Rt rib area.     History of Present Illness:  Patient is in today for reassessment of chronic medical issues.  Ms. Favela has a history of anxiety and is managed on bupropion and Valium. At her last visit, she had noted some increased anxiety symptoms and had asked about increasing her Valium. Instead, we added venlafaxine to her therapy. She notes she may ben having some mild side effects (clumsiness), but feels it has helped and would like to continue this at least another month.   Ms. Mcmahan has a history of hypertension and is managed on metoprolol.    Ms. Kops has hypothyroidism and is on levothyroxine. At her last visit, her TSH was high, so we increased her dosage to 100 mcg daily.   Ms. Sherk has a history of a pelvic organ prolapse. She has been referred by Dr. Judeth Horn to see a specialist in Jan. for consideration of surgical approaches for this.   Ms. Luckey has a history of restless legs. At her last visit, she was noting drowsiness that she attributed to her ropinirole. We reduced her dose and she feels things are improved.  Ms. Crenshaw notes she had a fall 2 days ago. She had gotten up to the bathroom int he middle of the night. It was very dark in her room when she came back to bed. She notes her bed is tall as well. This all contributed to her falling against a bedside table. For the first day she had some mid back pain which radiated in a band around both lower rib cages. This has since resolved.  Past Medical History: Patient Active Problem List   Diagnosis Date Noted    Obesity (BMI 30.0-34.9) 10/12/2020   Hiatal hernia 09/22/2020   GERD (gastroesophageal reflux disease) 09/22/2020   Diverticulosis 09/22/2020   Osteopenia 09/08/2020   Restless leg syndrome 06/05/2020   Pelvic organ prolapse quantification stage 2 rectocele 05/31/2020   Hypothyroidism 03/29/2020   Essential hypertension 03/29/2020   Kidney stones 03/29/2020   Asthma 03/29/2020   Anxiety disorder 03/29/2020   Past Surgical History:  Procedure Laterality Date   PARTIAL HYSTERECTOMY  1980   Pre-cancerous condition. Failed laser surgery. still have ovaries   UMBILICAL HERNIA REPAIR     with mesh   WRIST SURGERY Left 2019   Family History  Problem Relation Age of Onset   Diabetes Mother    Breast cancer Mother    Colon cancer Father    Kidney failure Father    COPD Brother    Diabetes Brother    Heart disease Brother    Heart disease Maternal Uncle    Heart disease Daughter    Depression Son    Esophageal cancer Neg Hx    Stomach cancer Neg Hx    Rectal cancer Neg Hx    Outpatient Medications Prior to Visit  Medication Sig Dispense Refill   albuterol (VENTOLIN HFA) 108 (90 Base) MCG/ACT inhaler INHALE 2 PUFFS BY MOUTH EVERY 4 HOURS AS NEEDED 8.5 g 3   aspirin 81 MG chewable tablet Chew 81  mg by mouth daily.     buPROPion (WELLBUTRIN SR) 150 MG 12 hr tablet TAKE 1 TABLET(150 MG) BY MOUTH TWICE DAILY 180 tablet 3   cholecalciferol (VITAMIN D3) 25 MCG (1000 UNIT) tablet      Cranberry 1000 MG CAPS Take by mouth.     diazepam (VALIUM) 5 MG tablet Take 1 tablet (5 mg total) by mouth 2 (two) times daily as needed. 30 tablet 1   levothyroxine (SYNTHROID) 100 MCG tablet Take 1 tablet (100 mcg total) by mouth daily. 90 tablet 3   metoprolol succinate (TOPROL-XL) 25 MG 24 hr tablet TAKE 1 TABLET(25 MG) BY MOUTH DAILY 90 tablet 3   promethazine (PHENERGAN) 25 MG tablet Take 1 tablet (25 mg total) by mouth every 6 (six) hours as needed for nausea or vomiting. 30 tablet 2   rOPINIRole  (REQUIP) 0.5 MG tablet Take 1 tablet (0.5 mg total) by mouth at bedtime. 30 tablet 5   venlafaxine XR (EFFEXOR XR) 37.5 MG 24 hr capsule Take 1 capsule (37.5 mg total) by mouth daily with breakfast. 30 capsule 5   No facility-administered medications prior to visit.   Allergies  Allergen Reactions   Codeine Hives   Sulfa Antibiotics Hives and Itching    Objective:   Today's Vitals   02/19/21 0756  BP: 134/80  Pulse: 85  Temp: (!) 97 F (36.1 C)  TempSrc: Temporal  SpO2: 96%  Weight: 175 lb 8 oz (79.6 kg)  Height: 5\' 2"  (1.575 m)   Body mass index is 32.1 kg/m.   General: Well developed, well nourished. No acute distress. Back: Straight. No tenderness on palpation. Psych: Alert and oriented. Normal mood and affect.  Health Maintenance Due  Topic Date Due   Pneumonia Vaccine 64+ Years old (1 - PCV) Never done   Hepatitis C Screening  Never done   Zoster Vaccines- Shingrix (1 of 2) Never done   MAMMOGRAM  Never done   COVID-19 Vaccine (3 - Pfizer risk series) 07/11/2019   INFLUENZA VACCINE  Never done   Lab Results Last CBC Lab Results  Component Value Date   WBC 5.4 01/12/2021   HGB 12.4 01/12/2021   HCT 38.0 01/12/2021   MCV 85.5 01/12/2021   RDW 14.6 01/12/2021   PLT 175.0 35/57/3220   Last metabolic panel Lab Results  Component Value Date   GLUCOSE 86 01/12/2021   NA 141 01/12/2021   K 4.5 01/12/2021   CL 105 01/12/2021   CO2 31 01/12/2021   BUN 12 01/12/2021   CREATININE 0.69 01/12/2021   CALCIUM 9.3 01/12/2021   Last lipids Lab Results  Component Value Date   CHOL 202 (H) 01/12/2021   HDL 65.30 01/12/2021   LDLCALC 106 (H) 01/12/2021   TRIG 153.0 (H) 01/12/2021   CHOLHDL 3 01/12/2021   Last thyroid functions Lab Results  Component Value Date   TSH 6.06 (H) 01/12/2021   Assessment & Plan:   1. Generalized anxiety disorder This is more stable now on venlafaxine. Ms. Chism want to continue the medicine, hoping the side effects do  improve.   2. Essential hypertension BP is adequately controlled. Continue metoprolol.  3. Hypothyroidism, unspecified type TSH was elevated at the last visit and her dose was increased. We wil recheck the TSH today.  - TSH  4. Restless leg syndrome Stable on current dose of ropinirole.  5. Pelvic organ prolapse quantification stage 2 rectocele Pending appointment with urogynecologist.  [name], MD

## 2021-02-21 ENCOUNTER — Ambulatory Visit
Admission: RE | Admit: 2021-02-21 | Discharge: 2021-02-21 | Disposition: A | Discharge status: Home / Self Care | Payer: PPO | Source: Ambulatory Visit | Attending: Family Medicine | Admitting: Family Medicine

## 2021-02-21 DIAGNOSIS — Z1231 Encounter for screening mammogram for malignant neoplasm of breast: Secondary | ICD-10-CM

## 2021-03-04 ENCOUNTER — Encounter: Payer: Self-pay | Admitting: Family Medicine

## 2021-03-14 ENCOUNTER — Ambulatory Visit (INDEPENDENT_AMBULATORY_CARE_PROVIDER_SITE_OTHER): Payer: PPO

## 2021-03-14 DIAGNOSIS — Z0000 Encounter for general adult medical examination without abnormal findings: Secondary | ICD-10-CM

## 2021-03-14 NOTE — Progress Notes (Signed)
Subjective:   Kimberly [name] is a 71 y.o. female who presents for an Initial Medicare Annual Wellness Visit.  I connected with Kimberly [name] today by telephone and verified that I am speaking with the correct person using two identifiers. Location patient: home Location provider: work Persons participating in the virtual visit: patient, provider.   I discussed the limitations, risks, security and privacy concerns of performing an evaluation and management service by telephone and the availability of in person appointments. I also discussed with the patient that there may be a patient responsible charge related to this service. The patient expressed understanding and verbally consented to this telephonic visit.    Interactive audio and video telecommunications were attempted between this provider and patient, however failed, due to patient having technical difficulties OR patient did not have access to video capability.  We continued and completed visit with audio only.    Review of Systems     Cardiac Risk Factors include: advanced age (>13men, >36 women)     Objective:    Today's Vitals   There is no height or weight on file to calculate BMI.  Advanced Directives 03/14/2021  Does Patient Have a Medical Advance Directive? Yes  Type of Paramedic of [place];Living will  Copy of [place] in Chart? No - copy requested    Current Medications (verified) Outpatient Encounter Medications as of 03/14/2021  Medication Sig   albuterol (VENTOLIN HFA) 108 (90 Base) MCG/ACT inhaler INHALE 2 PUFFS BY MOUTH EVERY 4 HOURS AS NEEDED   aspirin 81 MG chewable tablet Chew 81 mg by mouth daily.   buPROPion (WELLBUTRIN SR) 150 MG 12 hr tablet TAKE 1 TABLET(150 MG) BY MOUTH TWICE DAILY   cholecalciferol (VITAMIN D3) 25 MCG (1000 UNIT) tablet    Cranberry 1000 MG CAPS Take by mouth.   diazepam (VALIUM) 5 MG tablet Take 1 tablet (5 mg total) by mouth 2  (two) times daily as needed.   levothyroxine (SYNTHROID) 100 MCG tablet Take 1 tablet (100 mcg total) by mouth daily.   metoprolol succinate (TOPROL-XL) 25 MG 24 hr tablet TAKE 1 TABLET(25 MG) BY MOUTH DAILY   promethazine (PHENERGAN) 25 MG tablet Take 1 tablet (25 mg total) by mouth every 6 (six) hours as needed for nausea or vomiting.   rOPINIRole (REQUIP) 0.5 MG tablet Take 1 tablet (0.5 mg total) by mouth at bedtime.   venlafaxine XR (EFFEXOR XR) 37.5 MG 24 hr capsule Take 1 capsule (37.5 mg total) by mouth daily with breakfast.   [DISCONTINUED] famotidine 20 MG/2ML SOLN    No facility-administered encounter medications on file as of 03/14/2021.    Allergies (verified) Codeine and Sulfa antibiotics   History: Past Medical History:  Diagnosis Date   Anxiety    Asthma    Barrett esophagus    Chronic headaches    Colon polyps    COVID 03/2020   Depression    GERD (gastroesophageal reflux disease)    Hypertension    Kidney stones    Pneumonia    Thyroid disease    Uterine cancer ([place])    Past Surgical History:  Procedure Laterality Date   PARTIAL HYSTERECTOMY  1980   Pre-cancerous condition. Failed laser surgery. still have ovaries   UMBILICAL HERNIA REPAIR     with mesh   WRIST SURGERY Left 2019   Family History  Problem Relation Age of Onset   Diabetes Mother    Breast cancer Mother  Colon cancer Father    Kidney failure Father    COPD Brother    Diabetes Brother    Heart disease Brother    Heart disease Maternal Uncle    Heart disease Daughter    Depression Son    Esophageal cancer Neg Hx    Stomach cancer Neg Hx    Rectal cancer Neg Hx    Social History   Socioeconomic History   Marital status: Widowed    Spouse name: Richard   Number of children: 2   Years of education: 12 years   Highest education level: High school graduate  Occupational History   Occupation: retired    Comment: Retired  Tobacco Use   Smoking status: Former    Types:  Cigarettes    Quit date: 1995    Years since quitting: 28.0   Smokeless tobacco: Never  Vaping Use   Vaping Use: Never used  Substance and Sexual Activity   Alcohol use: Yes    Alcohol/week: 3.0 standard drinks    Types: 3 Standard drinks or equivalent per week    Comment: 1 per day   Drug use: Never   Sexual activity: Not Currently    Birth control/protection: Surgical    Comment: Hyst  Other Topics Concern   Not on file  Social History Narrative   Recently moved from [place] to [place]. 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   Social Determinants of Health   Financial Resource Strain: Low Risk    Difficulty of Paying Living Expenses: Not hard at all  Food Insecurity: No Food Insecurity   Worried About Charity fundraiser in the Last Year: Never true   [place] in the Last Year: Never true  Transportation Needs: No Transportation Needs   Lack of Transportation (Medical): No   Lack of Transportation (Non-Medical): No  Physical Activity: Inactive   Days of Exercise per Week: 0 days   Minutes of Exercise per Session: 0 min  Stress: No Stress Concern Present   Feeling of Stress : Not at all  Social Connections: Moderately Integrated   Frequency of Communication with Friends and Family: Twice a week   Frequency of Social Gatherings with Friends and Family: Twice a week   Attends Religious Services: More than 4 times per year   Active Member of [employer] or Organizations: Yes   Attends Archivist Meetings: More than 4 times per year   Marital Status: Widowed    Tobacco Counseling Counseling given: Not Answered   Clinical Intake:  Pre-visit preparation completed: Yes  Pain : No/denies pain     Nutritional Risks: None Diabetes: No  How often do you need to have someone help you when you read instructions, pamphlets, or other written materials from your doctor or pharmacy?: 1 - Never What is the last grade level you completed in  school?: High School  Diabetic?no   Interpreter Needed?: No  Information entered by :: L.Sarajane Fambrough,LPN   Activities of Daily Living In your present state of health, do you have any difficulty performing the following activities: 03/14/2021  Hearing? N  Vision? N  Difficulty concentrating or making decisions? N  Walking or climbing stairs? N  Dressing or bathing? N  Doing errands, shopping? N  Preparing Food and eating ? N  Using the Toilet? N  In the past six months, have you accidently leaked urine? N  Do you have problems with loss of bowel control? N  Managing your Medications? N  Managing your Finances? N  Housekeeping or managing your Housekeeping? N  Some recent data might be hidden    Patient Care Team: [name], MD as PCP - General (Family Medicine) Nunzio Cobbs, MD as Consulting Physician (Obstetrics and Gynecology) Rutherford Guys, MD as Consulting Physician (Ophthalmology) Irene Shipper, MD as Consulting Physician (Gastroenterology) Leandrew Koyanagi, MD as Attending Physician (Orthopedic Surgery)  Indicate any recent Medical Services you may have received from other than [facility] providers in the past year (date may be approximate).     Assessment:   This is a routine wellness examination for Kimberly [name].  Hearing/Vision screen Vision Screening - Comments:: Annual eye exams wear glasses   Dietary issues and exercise activities discussed: Current Exercise Habits: The patient does not participate in regular exercise at present, Exercise limited by: None identified   Goals Addressed   None    Depression Screen PHQ 2/9 Scores 03/14/2021 03/14/2021 03/29/2020  PHQ - 2 Score 0 0 0    Fall Risk Fall Risk  03/14/2021 10/12/2020 03/29/2020  Falls in the past year? 1 0 1  Number falls in past yr: 1 0 1  Injury with Fall? 1 0 0  Comment bruises - -  Risk for fall due to : - No Fall Risks -    FALL RISK PREVENTION PERTAINING TO THE HOME:  Any stairs in or  around the home? Yes  If so, are there any without handrails? No  Home free of loose throw rugs in walkways, pet beds, electrical cords, etc? Yes  Adequate lighting in your home to reduce risk of falls? Yes   ASSISTIVE DEVICES UTILIZED TO PREVENT FALLS:  Life alert? Yes  Use of a cane, walker or w/c? No  Grab bars in the bathroom? No  Shower chair or bench in shower? No  Elevated toilet seat or a handicapped toilet? No    Cognitive Function:  Normal cognitive status assessed by direct observation by this Nurse Health Advisor. No abnormalities found.        Immunizations Immunization History  Administered Date(s) Administered   PFIZER Comirnaty(Gray Top)Covid-19 Tri-Sucrose Vaccine 05/12/2019, 06/13/2019   Td 03/04/2016    TDAP status: Up to date  Flu Vaccine status: Declined, Education has been provided regarding the importance of this vaccine but patient still declined. Advised may receive this vaccine at local pharmacy or Health Dept. Aware to provide a copy of the vaccination record if obtained from local pharmacy or Health Dept. Verbalized acceptance and understanding.  Pneumococcal vaccine status: Due, Education has been provided regarding the importance of this vaccine. Advised may receive this vaccine at local pharmacy or Health Dept. Aware to provide a copy of the vaccination record if obtained from local pharmacy or Health Dept. Verbalized acceptance and understanding.  Covid-19 vaccine status: Completed vaccines  Qualifies for Shingles Vaccine? Yes   Zostavax completed No   Shingrix Completed?: No.    Education has been provided regarding the importance of this vaccine. Patient has been advised to call insurance company to determine out of pocket expense if they have not yet received this vaccine. Advised may also receive vaccine at local pharmacy or Health Dept. Verbalized acceptance and understanding.  Screening Tests Health Maintenance  Topic Date Due   Pneumonia  Vaccine 54+ Years old (1 - PCV) Never done   Hepatitis C Screening  Never done   Zoster Vaccines- Shingrix (1 of 2) Never done   COVID-19  Vaccine (3 - Pfizer risk series) 07/11/2019   INFLUENZA VACCINE  Never done   MAMMOGRAM  02/21/2022   COLONOSCOPY (Pts 45-26yrs Insurance coverage will need to be confirmed)  09/22/2025   TETANUS/TDAP  03/04/2026   DEXA SCAN  Completed   HPV VACCINES  Aged Out    Health Maintenance  Health Maintenance Due  Topic Date Due   Pneumonia Vaccine 3+ Years old (1 - PCV) Never done   Hepatitis C Screening  Never done   Zoster Vaccines- Shingrix (1 of 2) Never done   COVID-19 Vaccine (3 - Pfizer risk series) 07/11/2019   INFLUENZA VACCINE  Never done    Colorectal cancer screening: Type of screening: Colonoscopy. Completed 09/22/2020. Repeat every 5 years  Mammogram status: Completed 02/21/2021. Repeat every year  Bone Density status: Completed 09/06/2020. Results reflect: Bone density results: OSTEOPENIA. Repeat every 5 years.  Lung Cancer Screening: (Low Dose CT Chest recommended if Age 60-80 years, 30 pack-year currently smoking OR have quit w/in 15years.) does not qualify.   Lung Cancer Screening Referral: n/a  Additional Screening:  Hepatitis C Screening: does qualify;   Vision Screening: Recommended annual ophthalmology exams for early detection of glaucoma and other disorders of the eye. Is the patient up to date with their annual eye exam?  Yes  Who is the provider or what is the name of the office in which the patient attends annual eye exams? Dr.Shipiro  If pt is not established with a provider, would they like to be referred to a provider to establish care? No .   Dental Screening: Recommended annual dental exams for proper oral hygiene  Community Resource Referral / Chronic Care Management: CRR required this visit?  No   CCM required this visit?  No      Plan:     I have personally reviewed and noted the following in the  patients chart:   Medical and social history Use of alcohol, tobacco or illicit drugs  Current medications and supplements including opioid prescriptions. Patient is not currently taking opioid prescriptions. Functional ability and status Nutritional status Physical activity Advanced directives List of other physicians Hospitalizations, surgeries, and ER visits in previous 12 months Vitals Screenings to include cognitive, depression, and falls Referrals and appointments  In addition, I have reviewed and discussed with patient certain preventive protocols, quality metrics, and best practice recommendations. A written personalized care plan for preventive services as well as general preventive health recommendations were provided to patient.     Randel Pigg, LPN   1/61/0960   Nurse Notes: none

## 2021-03-14 NOTE — Patient Instructions (Signed)
Kimberly [name] , Thank you for taking time to come for your Medicare Wellness Visit. I appreciate your ongoing commitment to your health goals. Please review the following plan we discussed and let me know if I can assist you in the future.   Screening recommendations/referrals: Colonoscopy: 09/22/2020 due 2027 Mammogram: 02/21/2021 Bone Density: 09/06/2020 Recommended yearly ophthalmology/optometry visit for glaucoma screening and checkup Recommended yearly dental visit for hygiene and checkup  Vaccinations: Influenza vaccine: declined  Pneumococcal vaccine: declined  Tdap vaccine: 03/04/2016 Shingles vaccine: declined     Advanced directives: yes  Conditions/risks identified: none   Next appointment: none    Preventive Care 60 Years and Older, Female Preventive care refers to lifestyle choices and visits with your health care provider that can promote health and wellness. What does preventive care include? A yearly physical exam. This is also called an annual well check. Dental exams once or twice a year. Routine eye exams. Ask your health care provider how often you should have your eyes checked. Personal lifestyle choices, including: Daily care of your teeth and gums. Regular physical activity. Eating a healthy diet. Avoiding tobacco and drug use. Limiting alcohol use. Practicing safe sex. Taking low-dose aspirin every day. Taking vitamin and mineral supplements as recommended by your health care provider. What happens during an annual well check? The services and screenings done by your health care provider during your annual well check will depend on your age, overall health, lifestyle risk factors, and family history of disease. Counseling  Your health care provider may ask you questions about your: Alcohol use. Tobacco use. Drug use. Emotional well-being. Home and relationship well-being. Sexual activity. Eating habits. History of falls. Memory and ability to  understand (cognition). Work and work Statistician. Reproductive health. Screening  You may have the following tests or measurements: Height, weight, and BMI. Blood pressure. Lipid and cholesterol levels. These may be checked every 5 years, or more frequently if you are over 26 years old. Skin check. Lung cancer screening. You may have this screening every year starting at age 71 if you have a 30-pack-year history of smoking and currently smoke or have quit within the past 15 years. Fecal occult blood test (FOBT) of the stool. You may have this test every year starting at age 18. Flexible sigmoidoscopy or colonoscopy. You may have a sigmoidoscopy every 5 years or a colonoscopy every 10 years starting at age 47. Hepatitis C blood test. Hepatitis B blood test. Sexually transmitted disease (STD) testing. Diabetes screening. This is done by checking your blood sugar (glucose) after you have not eaten for a while (fasting). You may have this done every 1-3 years. Bone density scan. This is done to screen for osteoporosis. You may have this done starting at age 50. Mammogram. This may be done every 1-2 years. Talk to your health care provider about how often you should have regular mammograms. Talk with your health care provider about your test results, treatment options, and if necessary, the need for more tests. Vaccines  Your health care provider may recommend certain vaccines, such as: Influenza vaccine. This is recommended every year. Tetanus, diphtheria, and acellular pertussis (Tdap, Td) vaccine. You may need a Td booster every 10 years. Zoster vaccine. You may need this after age 4. Pneumococcal 13-valent conjugate (PCV13) vaccine. One dose is recommended after age 76. Pneumococcal polysaccharide (PPSV23) vaccine. One dose is recommended after age 39. Talk to your health care provider about which screenings and vaccines you need and how often  you need them. This information is not  intended to replace advice given to you by your health care provider. Make sure you discuss any questions you have with your health care provider. Document Released: 03/17/2015 Document Revised: 11/08/2015 Document Reviewed: 12/20/2014 Elsevier Interactive Patient Education  2017 [place] Prevention in the Home Falls can cause injuries. They can happen to people of all ages. There are many things you can do to make your home safe and to help prevent falls. What can I do on the outside of my home? Regularly fix the edges of walkways and driveways and fix any cracks. Remove anything that might make you trip as you walk through a door, such as a raised step or threshold. Trim any bushes or trees on the path to your home. Use bright outdoor lighting. Clear any walking paths of anything that might make someone trip, such as rocks or tools. Regularly check to see if handrails are loose or broken. Make sure that both sides of any steps have handrails. Any raised decks and porches should have guardrails on the edges. Have any leaves, snow, or ice cleared regularly. Use sand or salt on walking paths during winter. Clean up any spills in your garage right away. This includes oil or grease spills. What can I do in the bathroom? Use night lights. Install grab bars by the toilet and in the tub and shower. Do not use towel bars as grab bars. Use non-skid mats or decals in the tub or shower. If you need to sit down in the shower, use a plastic, non-slip stool. Keep the floor dry. Clean up any water that spills on the floor as soon as it happens. Remove soap buildup in the tub or shower regularly. Attach bath mats securely with double-sided non-slip rug tape. Do not have throw rugs and other things on the floor that can make you trip. What can I do in the bedroom? Use night lights. Make sure that you have a light by your bed that is easy to reach. Do not use any sheets or blankets that are  too big for your bed. They should not hang down onto the floor. Have a firm chair that has side arms. You can use this for support while you get dressed. Do not have throw rugs and other things on the floor that can make you trip. What can I do in the kitchen? Clean up any spills right away. Avoid walking on wet floors. Keep items that you use a lot in easy-to-reach places. If you need to reach something above you, use a strong step stool that has a grab bar. Keep electrical cords out of the way. Do not use floor polish or wax that makes floors slippery. If you must use wax, use non-skid floor wax. Do not have throw rugs and other things on the floor that can make you trip. What can I do with my stairs? Do not leave any items on the stairs. Make sure that there are handrails on both sides of the stairs and use them. Fix handrails that are broken or loose. Make sure that handrails are as long as the stairways. Check any carpeting to make sure that it is firmly attached to the stairs. Fix any carpet that is loose or worn. Avoid having throw rugs at the top or bottom of the stairs. If you do have throw rugs, attach them to the floor with carpet tape. Make sure that you have a light  switch at the top of the stairs and the bottom of the stairs. If you do not have them, ask someone to add them for you. What else can I do to help prevent falls? Wear shoes that: Do not have high heels. Have rubber bottoms. Are comfortable and fit you well. Are closed at the toe. Do not wear sandals. If you use a stepladder: Make sure that it is fully opened. Do not climb a closed stepladder. Make sure that both sides of the stepladder are locked into place. Ask someone to hold it for you, if possible. Clearly mark and make sure that you can see: Any grab bars or handrails. First and last steps. Where the edge of each step is. Use tools that help you move around (mobility aids) if they are needed. These  include: Canes. Walkers. Scooters. Crutches. Turn on the lights when you go into a dark area. Replace any light bulbs as soon as they burn out. Set up your furniture so you have a clear path. Avoid moving your furniture around. If any of your floors are uneven, fix them. If there are any pets around you, be aware of where they are. Review your medicines with your doctor. Some medicines can make you feel dizzy. This can increase your chance of falling. Ask your doctor what other things that you can do to help prevent falls. This information is not intended to replace advice given to you by your health care provider. Make sure you discuss any questions you have with your health care provider. Document Released: 12/15/2008 Document Revised: 07/27/2015 Document Reviewed: 03/25/2014 Elsevier Interactive Patient Education  2017 [employer].

## 2021-03-19 ENCOUNTER — Other Ambulatory Visit: Payer: Self-pay | Admitting: Family Medicine

## 2021-03-19 DIAGNOSIS — F411 Generalized anxiety disorder: Secondary | ICD-10-CM

## 2021-03-19 NOTE — Telephone Encounter (Signed)
Refill request for: Valium 5 mg LR  01/12/21, #30, 1 rf LOV 02/19/21 FOV 05/21/21  Please review and advise.  Thanks. Dm/cma

## 2021-03-23 DIAGNOSIS — R152 Fecal urgency: Secondary | ICD-10-CM | POA: Insufficient documentation

## 2021-03-23 DIAGNOSIS — N819 Female genital prolapse, unspecified: Secondary | ICD-10-CM | POA: Insufficient documentation

## 2021-03-23 DIAGNOSIS — N393 Stress incontinence (female) (male): Secondary | ICD-10-CM | POA: Insufficient documentation

## 2021-03-23 DIAGNOSIS — R159 Full incontinence of feces: Secondary | ICD-10-CM | POA: Insufficient documentation

## 2021-04-05 ENCOUNTER — Other Ambulatory Visit: Payer: Self-pay | Admitting: Family Medicine

## 2021-04-05 DIAGNOSIS — F411 Generalized anxiety disorder: Secondary | ICD-10-CM

## 2021-04-26 ENCOUNTER — Other Ambulatory Visit: Payer: Self-pay | Admitting: Family Medicine

## 2021-05-17 ENCOUNTER — Other Ambulatory Visit: Payer: Self-pay

## 2021-05-21 ENCOUNTER — Other Ambulatory Visit: Payer: Self-pay

## 2021-05-21 ENCOUNTER — Ambulatory Visit (INDEPENDENT_AMBULATORY_CARE_PROVIDER_SITE_OTHER): Payer: PPO | Admitting: Family Medicine

## 2021-05-21 VITALS — BP 134/80 | HR 70 | Temp 96.9°F | Wt 169.4 lb

## 2021-05-21 DIAGNOSIS — F411 Generalized anxiety disorder: Secondary | ICD-10-CM

## 2021-05-21 DIAGNOSIS — E039 Hypothyroidism, unspecified: Secondary | ICD-10-CM | POA: Diagnosis not present

## 2021-05-21 DIAGNOSIS — I10 Essential (primary) hypertension: Secondary | ICD-10-CM | POA: Diagnosis not present

## 2021-05-21 DIAGNOSIS — G2581 Restless legs syndrome: Secondary | ICD-10-CM

## 2021-05-21 DIAGNOSIS — N816 Rectocele: Secondary | ICD-10-CM

## 2021-05-21 DIAGNOSIS — E669 Obesity, unspecified: Secondary | ICD-10-CM

## 2021-05-21 NOTE — Progress Notes (Signed)
?[facility] ?[facility] ?[place] ?[place] [address] ?Dept: [phone] ?Dept Fax: [phone] ? ?Chronic Care Office Visit ? ?Subjective:  ? ? Patient ID: Kimberly [name], female    DOB: December 07, 1950, 71 y.o..   MRN: [id number] ? ?Chief Complaint  ?Patient presents with  ? Follow-up  ?  3 month f/u.   ? ? ?History of Present Illness: ? ?Patient is in today for reassessment of chronic medical issues. ? ?[name] has a history of anxiety and is managed on bupropion, Effexor and Valium. She is happy to report that the Effexor seems to be working for her, as she has been able to keep her Valium use very low. ?  ?[name] has a history of hypertension and is managed on metoprolol.  ?  ?[name] has hypothyroidism and is on levothyroxine. ?  ?Ms. Apple has a history of a pelvic organ prolapse. She was seen by Dr. Zigmund Daniel (urogynecologist) and is scheduled for surgery in late April. She plans a sacrospinal ligament fixation with posterior colporrhaphy and high perioneorrhaphy, mid-ureteral sling, and oophorectomy. ?  ?[name] has a history of restless legs. She states this is doing okay on the lower dose of ropinerole. She admits that she is not taking this every day, as she feels it interferes with her Effexor. ? ?[name] notes dissatisfaction with her weight.  She has been counting calories and had some small amount of weight loss. She would prefer her weight to be around 150 (19 lbs lower than current weight). She has heard about the positive results with semaglutide, but is put off by the cost. ? ?Past Medical History: ?Patient Active Problem List  ? Diagnosis Date Noted  ? Incontinence of feces with fecal urgency 03/23/2021  ? SUI (stress urinary incontinence, female) 03/23/2021  ? Vaginal vault prolapse 03/23/2021  ? Obesity (BMI 30.0-34.9) 10/12/2020  ? Hiatal hernia 09/22/2020  ? GERD (gastroesophageal reflux disease) 09/22/2020  ?  Diverticulosis 09/22/2020  ? Osteopenia 09/08/2020  ? Restless leg syndrome 06/05/2020  ? Pelvic organ prolapse quantification stage 2 rectocele 05/31/2020  ? Hypothyroidism 03/29/2020  ? Essential hypertension 03/29/2020  ? Kidney stones 03/29/2020  ? Asthma 03/29/2020  ? Anxiety disorder 03/29/2020  ? ?Past Surgical History:  ?Procedure Laterality Date  ? PARTIAL HYSTERECTOMY  1980  ? Pre-cancerous condition. Failed laser surgery. still have ovaries  ? UMBILICAL HERNIA REPAIR    ? with mesh  ? WRIST SURGERY Left 2019  ? ?Family History  ?Problem Relation Age of Onset  ? Diabetes Mother   ? Breast cancer Mother   ? Colon cancer Father   ? Kidney failure Father   ? COPD Brother   ? Diabetes Brother   ? Heart disease Brother   ? Heart disease Maternal Uncle   ? Heart disease Daughter   ? Depression Son   ? Esophageal cancer Neg Hx   ? Stomach cancer Neg Hx   ? Rectal cancer Neg Hx   ? ?Outpatient Medications Prior to Visit  ?Medication Sig Dispense Refill  ? albuterol (VENTOLIN HFA) 108 (90 Base) MCG/ACT inhaler INHALE 2 PUFFS BY MOUTH EVERY 4 HOURS AS NEEDED 8.5 g 3  ? aspirin 81 MG chewable tablet Chew 81 mg by mouth daily.    ? buPROPion (WELLBUTRIN SR) 150 MG 12 hr tablet TAKE 1 TABLET(150 MG) BY MOUTH TWICE DAILY 180 tablet 3  ? cholecalciferol (VITAMIN D3) 25 MCG (1000 UNIT) tablet     ?  Cranberry 1000 MG CAPS Take by mouth.    ? diazepam (VALIUM) 5 MG tablet TAKE 1/2 TO 1 TABLET(2.5 TO 5 MG) BY MOUTH IN THE MORNING AND AT BEDTIME. MAY TAKE AN ADDITIONAL DOSE DAILY AS NEEDED 60 tablet 1  ? levothyroxine (SYNTHROID) 100 MCG tablet Take 1 tablet (100 mcg total) by mouth daily. 90 tablet 3  ? metoprolol succinate (TOPROL-XL) 25 MG 24 hr tablet TAKE 1 TABLET(25 MG) BY MOUTH DAILY 90 tablet 3  ? promethazine (PHENERGAN) 25 MG tablet Take 1 tablet (25 mg total) by mouth every 6 (six) hours as needed for nausea or vomiting. 30 tablet 2  ? rOPINIRole (REQUIP) 0.5 MG tablet Take 1 tablet (0.5 mg total) by mouth at  bedtime. 30 tablet 5  ? venlafaxine XR (EFFEXOR XR) 37.5 MG 24 hr capsule Take 1 capsule (37.5 mg total) by mouth daily with breakfast. 30 capsule 5  ? rOPINIRole (REQUIP) 1 MG tablet TAKE 1 TABLET(1 MG) BY MOUTH AT BEDTIME 90 tablet 1  ? ?No facility-administered medications prior to visit.  ? ?Allergies  ?Allergen Reactions  ? Codeine Hives  ? Sulfa Antibiotics Hives and Itching  ?  ?Objective:  ? ?Today's Vitals  ? 05/21/21 0828  ?BP: 134/80  ?Pulse: 70  ?Temp: (!) 96.9 ?F (36.1 ?C)  ?TempSrc: Temporal  ?SpO2: 98%  ?Weight: 169 lb 6.4 oz (76.8 kg)  ? ?Body mass index is 30.98 kg/m?.  ? ?General: Well developed, well nourished. No acute distress. ?Psych: Alert and oriented. Normal mood and affect. ? ?Health Maintenance Due  ?Topic Date Due  ? Hepatitis C Screening  Never done  ? Zoster Vaccines- Shingrix (1 of 2) Never done  ? Pneumonia Vaccine 36+ Years old (1 - PCV) Never done  ? ?Lab Results ?Last thyroid functions ?Lab Results  ?Component Value Date  ? TSH 3.07 02/19/2021  ? ?Assessment & Plan:  ? ?1. Essential hypertension ?Blood pressure is adequately controlled. Continue metoprolol. ? ?2. Pelvic organ prolapse quantification stage 2 rectocele ?Scheduled for surgery in April. ? ?3. Hypothyroidism, unspecified type ?Last TSH was at goal. Continue levothyroxine. ? ?4. Generalized anxiety disorder ?Improved on bupropion 150 mg daily and venlafaxine 37.5 mg daily, with PRN Valium. We will continue this. ? ?5. Restless leg syndrome ?Stable though only using ropinirole PRN. ? ?6. Obesity (BMI 30.0-34.9) ?Discussed ongoing efforts at increased physical activity and calorie counting. We did discuss the expense of medication for weight loss and the likelihood to regain weight once stopped. I will cotninue to monitor this. ? ?Return in about 3 months (around 08/21/2021) for Reassessment.  ? ?Kimberly Salter, MD ?

## 2021-06-06 ENCOUNTER — Encounter: Payer: Self-pay | Admitting: Family Medicine

## 2021-06-06 ENCOUNTER — Other Ambulatory Visit: Payer: Self-pay | Admitting: Family Medicine

## 2021-06-06 DIAGNOSIS — F411 Generalized anxiety disorder: Secondary | ICD-10-CM

## 2021-06-29 HISTORY — PX: ANTERIOR AND POSTERIOR VAGINAL REPAIR W/ SACROSPINOUS LIGAMENT SUSPENSION: SUR6

## 2021-07-10 ENCOUNTER — Other Ambulatory Visit: Payer: Self-pay | Admitting: Family Medicine

## 2021-07-10 DIAGNOSIS — F411 Generalized anxiety disorder: Secondary | ICD-10-CM

## 2021-08-08 ENCOUNTER — Other Ambulatory Visit: Payer: Self-pay | Admitting: Family Medicine

## 2021-08-08 DIAGNOSIS — I10 Essential (primary) hypertension: Secondary | ICD-10-CM

## 2021-08-21 ENCOUNTER — Encounter: Payer: Self-pay | Admitting: Family Medicine

## 2021-08-21 ENCOUNTER — Ambulatory Visit (INDEPENDENT_AMBULATORY_CARE_PROVIDER_SITE_OTHER): Payer: PPO | Admitting: Family Medicine

## 2021-08-21 VITALS — BP 142/86 | HR 70 | Temp 97.1°F | Ht 62.0 in | Wt 169.2 lb

## 2021-08-21 DIAGNOSIS — J4520 Mild intermittent asthma, uncomplicated: Secondary | ICD-10-CM

## 2021-08-21 DIAGNOSIS — N819 Female genital prolapse, unspecified: Secondary | ICD-10-CM

## 2021-08-21 DIAGNOSIS — E039 Hypothyroidism, unspecified: Secondary | ICD-10-CM | POA: Diagnosis not present

## 2021-08-21 DIAGNOSIS — I10 Essential (primary) hypertension: Secondary | ICD-10-CM

## 2021-08-21 DIAGNOSIS — K219 Gastro-esophageal reflux disease without esophagitis: Secondary | ICD-10-CM

## 2021-08-21 DIAGNOSIS — F411 Generalized anxiety disorder: Secondary | ICD-10-CM

## 2021-08-21 LAB — TSH: TSH: 4.18 u[IU]/mL (ref 0.35–5.50)

## 2021-08-21 MED ORDER — ALBUTEROL SULFATE HFA 108 (90 BASE) MCG/ACT IN AERS: INHALATION_SPRAY | RESPIRATORY_TRACT | 3 refills | Status: DC

## 2021-08-21 MED ORDER — PROMETHAZINE HCL 25 MG PO TABS: 25.0000 mg | ORAL_TABLET | Freq: Four times a day (QID) | ORAL | 2 refills | Status: DC | PRN

## 2021-08-21 NOTE — Progress Notes (Signed)
[facility] [address] [place] [address] Dept: [phone] Dept Fax: [phone]  Chronic Care Office Visit  Subjective:    Patient ID: Kimberly [name], female    DOB: 1950-12-01, 71 y.o..   MRN: [id number]  Chief Complaint  Patient presents with   Follow-up    3 month f/u.  C/o having RT ear pain & neck stiffness.     History of Present Illness:  Patient is in today for reassessment of chronic medical issues.  Kimberly [name] has a history of anxiety and is managed on bupropion XR 150 mg daily, Effexor XR 37.5 mg daily and PRN Valium. She notes she had stopped taking the Valium around the time she had surgery this Spring, but has gone back to low level intermittent use.   Kimberly [name] has a history of hypertension and is managed on metoprolol XL 25 mg daily.    Kimberly [name] has hypothyroidism and is on levothyroxine 100 mcg daily.   Kimberly [name] had a history of a pelvic organ prolapse. She underwent sacrospinal ligament fixation, posterior colporrhaphy, and mid-urethral sling on 06/29/2021. She feels she is now recovered and is very happy with the results of her surgery.  Kimberly [name] has a history of mild, intermittent asthma. She feels this is in good control. She is only rarely using her albuterol.  Kimberly [name] has a history of GERD. She gets some nausea associated with this. She uses RN promethazine as needed to manage the nausea.  Past Medical History: Patient Active Problem List   Diagnosis Date Noted   Incontinence of feces with fecal urgency 03/23/2021   SUI (stress urinary incontinence, female) 03/23/2021   Vaginal vault prolapse 03/23/2021   Obesity (BMI 30.0-34.9) 10/12/2020   Hiatal hernia 09/22/2020   GERD (gastroesophageal reflux disease) 09/22/2020   Diverticulosis 09/22/2020   Osteopenia 09/08/2020   Restless leg syndrome 06/05/2020   Pelvic organ prolapse quantification stage 2 rectocele  05/31/2020   Hypothyroidism 03/29/2020   Essential hypertension 03/29/2020   Kidney stones 03/29/2020   Asthma 03/29/2020   Anxiety disorder 03/29/2020   Past Surgical History:  Procedure Laterality Date   PARTIAL HYSTERECTOMY  1980   Pre-cancerous condition. Failed laser surgery. still have ovaries   UMBILICAL HERNIA REPAIR     with mesh   WRIST SURGERY Left 2019   Family History  Problem Relation Age of Onset   Diabetes Mother    Breast cancer Mother    Colon cancer Father    Kidney failure Father    COPD Brother    Diabetes Brother    Heart disease Brother    Heart disease Maternal Uncle    Heart disease Daughter    Depression Son    Esophageal cancer Neg Hx    Stomach cancer Neg Hx    Rectal cancer Neg Hx    Outpatient Medications Prior to Visit  Medication Sig Dispense Refill   aspirin 81 MG chewable tablet Chew 81 mg by mouth daily.     buPROPion (WELLBUTRIN SR) 150 MG 12 hr tablet TAKE 1 TABLET(150 MG) BY MOUTH TWICE DAILY 180 tablet 1   cholecalciferol (VITAMIN D3) 25 MCG (1000 UNIT) tablet      Cranberry 1000 MG CAPS Take by mouth.     diazepam (VALIUM) 5 MG tablet TAKE 1/2 TO 1 TABLET(2.5 TO 5 MG) BY MOUTH IN THE MORNING AND AT BEDTIME. MAY TAKE AN ADDITIONAL DOSE DAILY AS NEEDED 60 tablet 1  levothyroxine (SYNTHROID) 100 MCG tablet Take 1 tablet (100 mcg total) by mouth daily. 90 tablet 3   metoprolol succinate (TOPROL-XL) 25 MG 24 hr tablet TAKE 1 TABLET(25 MG) BY MOUTH DAILY 90 tablet 1   rOPINIRole (REQUIP) 0.5 MG tablet Take 1 tablet (0.5 mg total) by mouth at bedtime. 30 tablet 5   venlafaxine XR (EFFEXOR-XR) 37.5 MG 24 hr capsule TAKE 1 CAPSULE(37.5 MG) BY MOUTH DAILY WITH BREAKFAST 30 capsule 1   albuterol (VENTOLIN HFA) 108 (90 Base) MCG/ACT inhaler INHALE 2 PUFFS BY MOUTH EVERY 4 HOURS AS NEEDED 8.5 g 3   promethazine (PHENERGAN) 25 MG tablet Take 1 tablet (25 mg total) by mouth every 6 (six) hours as needed for nausea or vomiting. 30 tablet 2   No  facility-administered medications prior to visit.   Allergies  Allergen Reactions   Codeine Hives   Sulfa Antibiotics Hives and Itching    Objective:   Today's Vitals   08/21/21 0819  BP: (!) 142/86  Pulse: 70  Temp: (!) 97.1 F (36.2 C)  TempSrc: Temporal  SpO2: 96%  Weight: 169 lb 3.2 oz (76.7 kg)  Height: '5\' 2"'$  (1.575 m)   Body mass index is 30.95 kg/m.   General: Well developed, well nourished. No acute distress. Psych: Alert and oriented. Normal mood and affect.  Health Maintenance Due  Topic Date Due   Hepatitis C Screening  Never done   Zoster Vaccines- Shingrix (1 of 2) Never done   Pneumonia Vaccine 42+ Years old (1 - PCV) Never done     Assessment & Plan:   1. Essential hypertension Blood pressure is elevated some today. Kimberly [name] feels this is up because she was upset at having to walk through the rain. She wants to continue her Toprol XL 25 mg daily. We did discuss adding to her therapy if this remains high at her next visit.  2. Mild intermittent asthma, unspecified whether complicated Stable. I will renew her albuterol.  - albuterol (VENTOLIN HFA) 108 (90 Base) MCG/ACT inhaler; INHALE 2 PUFFS BY MOUTH EVERY 4 HOURS AS NEEDED  Dispense: 8.5 g; Refill: 3  3. Gastroesophageal reflux disease without esophagitis Stable.  - promethazine (PHENERGAN) 25 MG tablet; Take 1 tablet (25 mg total) by mouth every 6 (six) hours as needed for nausea or vomiting.  Dispense: 30 tablet; Refill: 2  4. Hypothyroidism, unspecified type Due for repeat TSH. Cotninue levothyroxine 100 mcg daily.  - TSH  5. Generalized anxiety disorder Stable on Effexor, bupropion, and PRN Valium.  6. Vaginal vault prolapse Issue is now surgically corrected and Kimberly [name] is happy with the result.  Return in about 3 months (around 11/21/2021) for Reassessment.   [name], MD

## 2021-09-14 ENCOUNTER — Other Ambulatory Visit: Payer: Self-pay | Admitting: Family Medicine

## 2021-09-14 DIAGNOSIS — F411 Generalized anxiety disorder: Secondary | ICD-10-CM

## 2021-11-13 ENCOUNTER — Encounter: Payer: Self-pay | Admitting: Family Medicine

## 2021-11-13 ENCOUNTER — Ambulatory Visit (INDEPENDENT_AMBULATORY_CARE_PROVIDER_SITE_OTHER): Payer: PPO | Admitting: Family Medicine

## 2021-11-13 VITALS — BP 134/72 | HR 64 | Temp 97.9°F | Ht 62.0 in | Wt 163.4 lb

## 2021-11-13 DIAGNOSIS — F411 Generalized anxiety disorder: Secondary | ICD-10-CM

## 2021-11-13 DIAGNOSIS — I10 Essential (primary) hypertension: Secondary | ICD-10-CM | POA: Diagnosis not present

## 2021-11-13 DIAGNOSIS — E039 Hypothyroidism, unspecified: Secondary | ICD-10-CM | POA: Diagnosis not present

## 2021-11-13 MED ORDER — METOPROLOL SUCCINATE ER 25 MG PO TB24: 25.0000 mg | ORAL_TABLET | Freq: Every day | ORAL | 3 refills | Status: DC

## 2021-11-13 NOTE — Progress Notes (Signed)
[facility] [address] [place] [address] Dept: [phone] Dept Fax: [phone]  Chronic Care Office Visit  Subjective:    Patient ID: Kimberly [name], female    DOB: 1950/07/13, 71 y.o..   MRN: [id number]  Chief Complaint  Patient presents with   Follow-up    F/u meds.      History of Present Illness:  Patient is in today for reassessment of chronic medical issues.  Kimberly [name] has a history of anxiety and is managed on bupropion XR 150 mg daily, Effexor XR 37.5 mg daily and PRN Valium. She feels her mood is much improved. She is interested in stopping her antianxiety meds.   Kimberly [name] has a history of hypertension and is managed on metoprolol XL 25 mg daily.    Kimberly [name] has hypothyroidism and is on levothyroxine 100 mcg daily.  Past Medical History: Patient Active Problem List   Diagnosis Date Noted   Incontinence of feces with fecal urgency 03/23/2021   SUI (stress urinary incontinence, female) 03/23/2021   Vaginal vault prolapse 03/23/2021   Obesity (BMI 30.0-34.9) 10/12/2020   Hiatal hernia 09/22/2020   GERD (gastroesophageal reflux disease) 09/22/2020   Diverticulosis 09/22/2020   Osteopenia 09/08/2020   Restless leg syndrome 06/05/2020   Pelvic organ prolapse quantification stage 2 rectocele 05/31/2020   Hypothyroidism 03/29/2020   Essential hypertension 03/29/2020   Kidney stones 03/29/2020   Asthma 03/29/2020   Anxiety disorder 03/29/2020   Past Surgical History:  Procedure Laterality Date   ANTERIOR AND POSTERIOR VAGINAL REPAIR W/ SACROSPINOUS LIGAMENT SUSPENSION  06/29/2021   PARTIAL HYSTERECTOMY  1980   Pre-cancerous condition. Failed laser surgery. still have ovaries   UMBILICAL HERNIA REPAIR     with mesh   WRIST SURGERY Left 2019   Family History  Problem Relation Age of Onset   Diabetes Mother    Breast cancer Mother    Colon cancer Father    Kidney failure Father    COPD  Brother    Diabetes Brother    Heart disease Brother    Heart disease Maternal Uncle    Heart disease Daughter    Depression Son    Esophageal cancer Neg Hx    Stomach cancer Neg Hx    Rectal cancer Neg Hx    Outpatient Medications Prior to Visit  Medication Sig Dispense Refill   albuterol (VENTOLIN HFA) 108 (90 Base) MCG/ACT inhaler INHALE 2 PUFFS BY MOUTH EVERY 4 HOURS AS NEEDED 8.5 g 3   aspirin 81 MG chewable tablet Chew 81 mg by mouth daily.     buPROPion (WELLBUTRIN SR) 150 MG 12 hr tablet TAKE 1 TABLET(150 MG) BY MOUTH TWICE DAILY 180 tablet 1   cholecalciferol (VITAMIN D3) 25 MCG (1000 UNIT) tablet      Cranberry 1000 MG CAPS Take by mouth.     diazepam (VALIUM) 5 MG tablet TAKE 1/2 TO 1 TABLET(2.5 TO 5 MG) BY MOUTH IN THE MORNING AND AT BEDTIME. MAY TAKE AN ADDITIONAL DOSE DAILY AS NEEDED 60 tablet 1   levothyroxine (SYNTHROID) 100 MCG tablet Take 1 tablet (100 mcg total) by mouth daily. 90 tablet 3   promethazine (PHENERGAN) 25 MG tablet Take 1 tablet (25 mg total) by mouth every 6 (six) hours as needed for nausea or vomiting. 30 tablet 2   rOPINIRole (REQUIP) 0.5 MG tablet Take 1 tablet (0.5 mg total) by mouth at bedtime. 30 tablet 5   metoprolol succinate (TOPROL-XL) 25 MG  24 hr tablet TAKE 1 TABLET(25 MG) BY MOUTH DAILY 90 tablet 1   venlafaxine XR (EFFEXOR-XR) 37.5 MG 24 hr capsule TAKE 1 CAPSULE(37.5 MG) BY MOUTH DAILY WITH BREAKFAST 90 capsule 3   No facility-administered medications prior to visit.   Allergies  Allergen Reactions   Codeine Hives   Sulfa Antibiotics Hives and Itching   Objective:   Today's Vitals   11/13/21 1303  BP: 134/72  Pulse: 64  Temp: 97.9 F (36.6 C)  TempSrc: Temporal  SpO2: 95%  Weight: 163 lb 6.4 oz (74.1 kg)  Height: '5\' 2"'$  (1.575 m)   Body mass index is 29.89 kg/m.   General: Well developed, well nourished. No acute distress. Psych: Alert and oriented. Normal mood and affect.  Health Maintenance Due  Topic Date Due    Hepatitis C Screening  Never done   Zoster Vaccines- Shingrix (1 of 2) Never done   Pneumonia Vaccine 90+ Years old (1 - PCV) Never done    Lab Results Last lipids Lab Results  Component Value Date   CHOL 202 (H) 01/12/2021   HDL 65.30 01/12/2021   LDLCALC 106 (H) 01/12/2021   TRIG 153.0 (H) 01/12/2021   CHOLHDL 3 01/12/2021   Last thyroid functions Lab Results  Component Value Date   TSH 4.18 08/21/2021     Assessment & Plan:   1. Essential hypertension Kimberly [name]'s blood pressure is at goal. She will continue metoprolol XL 25 mg daily  - metoprolol succinate (TOPROL-XL) 25 MG 24 hr tablet; Take 1 tablet (25 mg total) by mouth daily.  Dispense: 90 tablet; Refill: 3  2. Hypothyroidism, unspecified type Kimberly [name]'s TSH is at goal. She will continue levothyroxine 100 mcg daily.  3. Generalized anxiety disorder Kimberly [name]'s anxiety is doing much better. She would like a trial off of meds. I will have her stop her venlafaxine, since she is on the lowest dose of this. If her mood remains in good control off this, we will discuss tapering her bupropion.  Return in about 3 months (around 02/12/2022) for Reassessment.   Kimberly Salter, MD

## 2021-11-26 ENCOUNTER — Ambulatory Visit (INDEPENDENT_AMBULATORY_CARE_PROVIDER_SITE_OTHER): Payer: PPO | Admitting: Family Medicine

## 2021-11-26 ENCOUNTER — Encounter: Payer: Self-pay | Admitting: Family Medicine

## 2021-11-26 VITALS — BP 134/80 | HR 92 | Temp 97.0°F | Wt 162.0 lb

## 2021-11-26 DIAGNOSIS — G8929 Other chronic pain: Secondary | ICD-10-CM

## 2021-11-26 DIAGNOSIS — M542 Cervicalgia: Secondary | ICD-10-CM

## 2021-11-26 DIAGNOSIS — R519 Headache, unspecified: Secondary | ICD-10-CM

## 2021-11-26 MED ORDER — RIZATRIPTAN BENZOATE 5 MG PO TABS: 5.0000 mg | ORAL_TABLET | ORAL | 1 refills | Status: DC | PRN

## 2021-11-26 NOTE — Progress Notes (Signed)
Kimberly [name], female    DOB: [birth year], 71 y.o..   MRN: [id number]  Chief Complaint  Patient presents with   Acute Visit    Headaches. Often every day.Naproxen for pain relief.Pain rate 7. Nausea      History of Present Illness:  Patient is in today complaining of recent issues with recurrent right-sided headaches. She notes she is frequently awakening with these int he morning. She had been using Tylenol, but this has not been very helpful. When she was around age 60, she recalls being treated with Imitrex for headaches. She does admit to some mild nausea associated with these, though she has not noted phono- or photophobia. Additionally, she has noted some neck pain with associated grinding and stiffness at times when rotating her head. She has some occasional jaw discomfort, that she relates to dental losses over time. Lastly, she does still have some sensitivity to the right ear.  Past Medical History: Patient Active Problem List   Diagnosis Date Noted   Incontinence of feces with fecal urgency 03/23/2021   SUI (stress urinary incontinence, female) 03/23/2021   Vaginal vault prolapse 03/23/2021   Obesity (BMI 30.0-34.9) 10/12/2020   Hiatal hernia 09/22/2020   GERD (gastroesophageal reflux disease) 09/22/2020   Diverticulosis 09/22/2020   Osteopenia 09/08/2020   Restless leg syndrome 06/05/2020   Pelvic organ prolapse quantification stage 2 rectocele 05/31/2020   Hypothyroidism 03/29/2020   Essential hypertension 03/29/2020   Kidney stones 03/29/2020   Asthma 03/29/2020   Anxiety disorder 03/29/2020   Past Surgical History:  Procedure Laterality Date   ANTERIOR AND POSTERIOR VAGINAL REPAIR W/ SACROSPINOUS LIGAMENT SUSPENSION  06/29/2021   PARTIAL HYSTERECTOMY  1980   Pre-cancerous  condition. Failed laser surgery. still have ovaries   UMBILICAL HERNIA REPAIR     with mesh   WRIST SURGERY Left 2019    Family History  Problem Relation Age of Onset   Diabetes Mother    Breast cancer Mother    Colon cancer Father    Kidney failure Father    COPD Brother    Diabetes Brother    Heart disease Brother    Heart disease Maternal Uncle    Heart disease Daughter    Depression Son    Esophageal cancer Neg Hx    Stomach cancer Neg Hx    Rectal cancer Neg Hx     Outpatient Medications Prior to Visit  Medication Sig Dispense Refill   albuterol (VENTOLIN HFA) 108 (90 Base) MCG/ACT inhaler INHALE 2 PUFFS BY MOUTH EVERY 4 HOURS AS NEEDED 8.5 g 3   aspirin 81 MG chewable tablet Chew 81 mg by mouth daily.     buPROPion (WELLBUTRIN SR) 150 MG 12 hr tablet TAKE 1 TABLET(150 MG) BY MOUTH TWICE DAILY 180 tablet 1   cholecalciferol (VITAMIN D3) 25 MCG (1000 UNIT) tablet      Cranberry 1000 MG CAPS Take by mouth.     diazepam (VALIUM) 5 MG tablet TAKE 1/2 TO 1 TABLET(2.5 TO 5 MG) BY MOUTH IN THE MORNING AND AT BEDTIME. MAY TAKE AN ADDITIONAL DOSE DAILY AS NEEDED 60 tablet 1   levothyroxine (SYNTHROID) 100 MCG tablet Take 1 tablet (100 mcg total) by mouth daily. 90 tablet 3   metoprolol succinate (TOPROL-XL) 25 MG 24 hr tablet Take 1 tablet (25 mg total) by mouth  daily. 90 tablet 3   promethazine (PHENERGAN) 25 MG tablet Take 1 tablet (25 mg total) by mouth every 6 (six) hours as needed for nausea or vomiting. 30 tablet 2   rOPINIRole (REQUIP) 0.5 MG tablet Take 1 tablet (0.5 mg total) by mouth at bedtime. 30 tablet 5   No facility-administered medications prior to visit.    Allergies  Allergen Reactions   Codeine Hives   Sulfa Antibiotics Hives and Itching      Objective:   Today's Vitals   11/26/21 1323  BP: 134/80  Pulse: 92  Temp: (!) 97 F (36.1 C)  SpO2: 97%  Weight: 162 lb (73.5 kg)   Body mass index is 29.63 kg/m.   General: Well developed, well  nourished. No acute distress. HEENT: Normocephalic, non-traumatic. PERRL, EOMI. Conjunctiva clear. Fundiscopic exam   shows normal disc and vasculature. External ears normal. EAC and TMs normal bilaterally.   No clicking or subluxation of the TMJ joint. Nose clear without congestion or rhinorrhea.   Mucous membranes moist. Oropharynx clear. Good dentition. Neck: Supple. No lymphadenopathy. No thyromegaly. Psych: Alert and oriented. Normal mood and affect.  Health Maintenance Due  Topic Date Due   Hepatitis C Screening  Never done   Zoster Vaccines- Shingrix (1 of 2) Never done   Pneumonia Vaccine 78+ Years old (1 - PCV) Never done     Assessment & Plan:   1. Nonintractable episodic headache, unspecified headache type By history, Ms. Lenig has migraine headaches. She does describe responding to sumatriptan in the past. We will give a trial back on a triptan and see if this does effectively manage her headaches.  - rizatriptan (MAXALT) 5 MG tablet; Take 1 tablet (5 mg total) by mouth as needed for migraine. May repeat in 2 hours if needed  Dispense: 10 tablet; Refill: 1  2. Chronic neck pain The neck pain likely is due to arthritic changes. We discussed physical measures to try and improve her pain. I will have her use heat with stretches. We also discussed massage, chiropractic manipulation, and use of specialized pillows. She did not seem receptive to trying these currently. She was interested in PT if her pain worsens.  Return for As scheduled.   [name], MD

## 2021-12-15 ENCOUNTER — Other Ambulatory Visit: Payer: Self-pay | Admitting: Family Medicine

## 2021-12-15 DIAGNOSIS — F411 Generalized anxiety disorder: Secondary | ICD-10-CM

## 2021-12-15 DIAGNOSIS — E039 Hypothyroidism, unspecified: Secondary | ICD-10-CM

## 2022-02-18 ENCOUNTER — Encounter: Payer: Self-pay | Admitting: Family Medicine

## 2022-02-18 ENCOUNTER — Ambulatory Visit (INDEPENDENT_AMBULATORY_CARE_PROVIDER_SITE_OTHER): Payer: PPO | Admitting: Family Medicine

## 2022-02-18 VITALS — BP 130/72 | HR 78 | Temp 96.9°F | Ht 62.0 in | Wt 153.6 lb

## 2022-02-18 DIAGNOSIS — I10 Essential (primary) hypertension: Secondary | ICD-10-CM

## 2022-02-18 DIAGNOSIS — E663 Overweight: Secondary | ICD-10-CM | POA: Diagnosis not present

## 2022-02-18 DIAGNOSIS — E039 Hypothyroidism, unspecified: Secondary | ICD-10-CM | POA: Diagnosis not present

## 2022-02-18 LAB — BASIC METABOLIC PANEL
BUN: 13 mg/dL (ref 6–23)
CO2: 30 mEq/L (ref 19–32)
Calcium: 10.2 mg/dL (ref 8.4–10.5)
Chloride: 102 mEq/L (ref 96–112)
Creatinine, Ser: 0.76 mg/dL (ref 0.40–1.20)
GFR: 79.47 mL/min (ref >60.00)
Glucose, Bld: 99 mg/dL (ref 70–99)
Potassium: 4.6 mEq/L (ref 3.5–5.1)
Sodium: 142 mEq/L (ref 135–145)

## 2022-02-18 NOTE — Progress Notes (Signed)
[facility] [address] [place] [address] Dept: [phone] Dept Fax: [phone]  Chronic Care Office Visit  Subjective:    Patient ID: Kimberly [name], female    DOB: 04/17/1950, 71 y.o..   MRN: [id number]  Chief Complaint  Patient presents with   Follow-up    3 month f/u.  No concerns.  No fasting.   (Down 9 lb)    History of Present Illness:  Patient is in today for reassessment of chronic medical issues.  Kimberly [name] has a history of hypertension and is managed on metoprolol XL 25 mg daily.    Kimberly [name] has hypothyroidism and is on levothyroxine 100 mcg daily.   Kimberly [name] has had concerns about her weight. She notes she did go to [facility], near [place]. She was prescribed phentermine 37.5 mg daily. She notes she3 has taken very little of this. She has been pleased that she was able to achieve some weight loss over the past year. Her goal weight is about 148 lbs.  Past Medical History: Patient Active Problem List   Diagnosis Date Noted   Incontinence of feces with fecal urgency 03/23/2021   SUI (stress urinary incontinence, female) 03/23/2021   Overweight (BMI 25.0-29.9) 10/12/2020   Hiatal hernia 09/22/2020   GERD (gastroesophageal reflux disease) 09/22/2020   Diverticulosis 09/22/2020   Osteopenia 09/08/2020   Restless leg syndrome 06/05/2020   Pelvic organ prolapse quantification stage 2 rectocele 05/31/2020   Hypothyroidism 03/29/2020   Essential hypertension 03/29/2020   Kidney stones 03/29/2020   Asthma 03/29/2020   Anxiety disorder 03/29/2020   Past Surgical History:  Procedure Laterality Date   ANTERIOR AND POSTERIOR VAGINAL REPAIR W/ SACROSPINOUS LIGAMENT SUSPENSION  06/29/2021   PARTIAL HYSTERECTOMY  1980   Pre-cancerous condition. Failed laser surgery. still have ovaries   UMBILICAL HERNIA REPAIR     with mesh   WRIST SURGERY Left 2019   Family History  Problem  Relation Age of Onset   Diabetes Mother    Breast cancer Mother    Colon cancer Father    Kidney failure Father    COPD Brother    Diabetes Brother    Heart disease Brother    Heart disease Maternal Uncle    Heart disease Daughter    Depression Son    Esophageal cancer Neg Hx    Stomach cancer Neg Hx    Rectal cancer Neg Hx    Outpatient Medications Prior to Visit  Medication Sig Dispense Refill   albuterol (VENTOLIN HFA) 108 (90 Base) MCG/ACT inhaler INHALE 2 PUFFS BY MOUTH EVERY 4 HOURS AS NEEDED 8.5 g 3   aspirin 81 MG chewable tablet Chew 81 mg by mouth daily.     buPROPion (WELLBUTRIN SR) 150 MG 12 hr tablet TAKE 1 TABLET(150 MG) BY MOUTH TWICE DAILY 180 tablet 1   cholecalciferol (VITAMIN D3) 25 MCG (1000 UNIT) tablet      Cranberry 1000 MG CAPS Take by mouth.     diazepam (VALIUM) 5 MG tablet TAKE 1/2 TO 1 TABLET(2.5 TO 5 MG) BY MOUTH IN THE MORNING AND AT BEDTIME. MAY TAKE AN ADDITIONAL DOSE DAILY AS NEEDED 60 tablet 1   levothyroxine (SYNTHROID) 100 MCG tablet TAKE 1 TABLET(100 MCG) BY MOUTH DAILY 90 tablet 3   metoprolol succinate (TOPROL-XL) 25 MG 24 hr tablet Take 1 tablet (25 mg total) by mouth daily. 90 tablet 3   promethazine (PHENERGAN) 25 MG tablet Take 1  tablet (25 mg total) by mouth every 6 (six) hours as needed for nausea or vomiting. 30 tablet 2   rizatriptan (MAXALT) 5 MG tablet Take 1 tablet (5 mg total) by mouth as needed for migraine. May repeat in 2 hours if needed 10 tablet 1   rOPINIRole (REQUIP) 0.5 MG tablet Take 1 tablet (0.5 mg total) by mouth at bedtime. 30 tablet 5   phentermine (ADIPEX-P) 37.5 MG tablet Take 37.5 mg by mouth daily. (Patient not taking: Reported on 02/18/2022)     No facility-administered medications prior to visit.   Allergies  Allergen Reactions   Codeine Hives   Sulfa Antibiotics Hives and Itching     Objective:   Today's Vitals   02/18/22 0904  BP: 130/72  Pulse: 78  Temp: (!) 96.9 F (36.1 C)  TempSrc: Temporal   SpO2: 98%  Weight: 153 lb 9.6 oz (69.7 kg)  Height: '5\' 2"'$  (1.575 m)   Body mass index is 28.09 kg/m.   General: Well developed, well nourished. No acute distress. Extremities: Patellar reflex 2+ bilat. Psych: Alert and oriented. Normal mood and affect.  Health Maintenance Due  Topic Date Due   Hepatitis C Screening  Never done   Zoster Vaccines- Shingrix (1 of 2) Never done   Pneumonia Vaccine 34+ Years old (1 - PCV) Never done   Medicare Annual Wellness (AWV)  03/14/2022   Lab Results Last metabolic panel Lab Results  Component Value Date   GLUCOSE 86 01/12/2021   NA 141 01/12/2021   K 4.5 01/12/2021   CL 105 01/12/2021   CO2 31 01/12/2021   BUN 12 01/12/2021   CREATININE 0.69 01/12/2021   CALCIUM 9.3 01/12/2021   Last lipids Lab Results  Component Value Date   CHOL 202 (H) 01/12/2021   HDL 65.30 01/12/2021   LDLCALC 106 (H) 01/12/2021   TRIG 153.0 (H) 01/12/2021   CHOLHDL 3 01/12/2021   Last thyroid functions Lab Results  Component Value Date   TSH 4.18 08/21/2021     Assessment & Plan:   1. Essential hypertension Blood pressure is adequately managed and improved since last Spring. Continue metoprolol succinate 25 mg daily.  - Basic metabolic panel  2. Hypothyroidism, unspecified type Continue levothyroxine 100 mcg daily.  3. Overweight (BMI 25.0-29.9) Maximum weight: 176 lbs (01/2021) Current weight: 153 lbs Weight change since last visit: - 9 lbs Total weight loss: 23 lbs (13%)  Doing well with weight loss. I cautioned her about use of phentermine, esp. with her only sporadically using it and being within 5 lb of her goal weight. I do recommend she engaged in 30 min. of moderate exercise 3-5 days a week.  Return in about 3 months (around 05/20/2022) for Reassessment.   [name], MD

## 2022-03-18 ENCOUNTER — Ambulatory Visit (INDEPENDENT_AMBULATORY_CARE_PROVIDER_SITE_OTHER): Payer: PPO

## 2022-03-18 VITALS — BP 138/82 | HR 85 | Temp 97.5°F | Ht 61.5 in | Wt 151.6 lb

## 2022-03-18 DIAGNOSIS — Z0000 Encounter for general adult medical examination without abnormal findings: Secondary | ICD-10-CM

## 2022-03-18 NOTE — Progress Notes (Signed)
Subjective:   Lorilei Horan is a 72 y.o. female who presents for an Initial Medicare Annual Wellness Visit.  Review of Systems     Cardiac Risk Factors include: advanced age (>77mn, >>9women);hypertension     Objective:    Today's Vitals   03/18/22 1024  BP: 138/82  Pulse: 85  Temp: (!) 97.5 F (36.4 C)  TempSrc: Oral  SpO2: 91%  Weight: 151 lb 9.6 oz (68.8 kg)  Height: 5' 1.5" (1.562 m)   Body mass index is 28.18 kg/m.     03/18/2022   10:34 AM 03/14/2021    9:53 AM  Advanced Directives  Does Patient Have a Medical Advance Directive? Yes Yes  Type of AParamed[place]of [place] will [place] will  Copy of [place] Chart? No - copy requested No - copy requested    Current Medications (verified) Outpatient Encounter Medications as of 03/18/2022  Medication Sig   albuterol (VENTOLIN HFA) 108 (90 Base) MCG/ACT inhaler INHALE 2 PUFFS BY MOUTH EVERY 4 HOURS AS NEEDED   aspirin 81 MG chewable tablet Chew 81 mg by mouth daily.   buPROPion (WELLBUTRIN SR) 150 MG 12 hr tablet TAKE 1 TABLET(150 MG) BY MOUTH TWICE DAILY   cholecalciferol (VITAMIN D3) 25 MCG (1000 UNIT) tablet    Cranberry 1000 MG CAPS Take by mouth.   diazepam (VALIUM) 5 MG tablet TAKE 1/2 TO 1 TABLET(2.5 TO 5 MG) BY MOUTH IN THE MORNING AND AT BEDTIME. MAY TAKE AN ADDITIONAL DOSE DAILY AS NEEDED   levothyroxine (SYNTHROID) 100 MCG tablet TAKE 1 TABLET(100 MCG) BY MOUTH DAILY   metoprolol succinate (TOPROL-XL) 25 MG 24 hr tablet Take 1 tablet (25 mg total) by mouth daily.   promethazine (PHENERGAN) 25 MG tablet Take 1 tablet (25 mg total) by mouth every 6 (six) hours as needed for nausea or vomiting.   rOPINIRole (REQUIP) 0.5 MG tablet Take 1 tablet (0.5 mg total) by mouth at bedtime. (Patient taking differently: Take 0.5 mg by mouth at bedtime. Takes as needed)   phentermine (ADIPEX-P) 37.5 MG tablet Take 37.5 mg by mouth daily.  (Patient not taking: Reported on 02/18/2022)   rizatriptan (MAXALT) 5 MG tablet Take 1 tablet (5 mg total) by mouth as needed for migraine. May repeat in 2 hours if needed (Patient not taking: Reported on 03/18/2022)   [DISCONTINUED] famotidine 20 MG/2ML SOLN    No facility-administered encounter medications on file as of 03/18/2022.    Allergies (verified) Codeine and Sulfa antibiotics   History: Past Medical History:  Diagnosis Date   Anxiety    Asthma    Barrett esophagus    Chronic headaches    Colon polyps    COVID 03/2020   Depression    GERD (gastroesophageal reflux disease)    Hypertension    Kidney stones    Pneumonia    Thyroid disease    Uterine cancer ([facility]    Past Surgical History:  Procedure Laterality Date   ANTERIOR AND POSTERIOR VAGINAL REPAIR W/ SACROSPINOUS LIGAMENT SUSPENSION  06/29/2021   PARTIAL HYSTERECTOMY  1980   Pre-cancerous condition. Failed laser surgery. still have ovaries   UMBILICAL HERNIA REPAIR     with mesh   WRIST SURGERY Left 2019   Family History  Problem Relation Age of Onset   Diabetes Mother    Breast cancer Mother    Colon cancer Father    Kidney failure Father    COPD Brother  Diabetes Brother    Heart disease Brother    Heart disease Maternal Uncle    Heart disease Daughter    Depression Son    Esophageal cancer Neg Hx    Stomach cancer Neg Hx    Rectal cancer Neg Hx    Social History   Socioeconomic History   Marital status: Widowed    Spouse name: Richard   Number of children: 2   Years of education: 12 years   Highest education level: High school graduate  Occupational History   Occupation: retired    Comment: Retired  Tobacco Use   Smoking status: Former    Types: Cigarettes    Quit date: 1995    Years since quitting: 29.0   Smokeless tobacco: Never  Vaping Use   Vaping Use: Never used  Substance and Sexual Activity   Alcohol use: Yes    Alcohol/week: 3.0 standard drinks of alcohol    Types: 3  Standard drinks or equivalent per week    Comment: 1 per day   Drug use: Never   Sexual activity: Not Currently    Birth control/protection: Surgical    Comment: Hyst  Other Topics Concern   Not on file  Social History Narrative   Recently moved from [place] to [place]. 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   Social Determinants of Health   Financial Resource Strain: Low Risk  (03/18/2022)   Overall Financial Resource Strain (CARDIA)    Difficulty of Paying Living Expenses: Not hard at all  Food Insecurity: No Food Insecurity (03/18/2022)   Hunger Vital Sign    Worried About Running Out of Food in the Last Year: Never true    Ran Out of Food in the Last Year: Never true  Transportation Needs: No Transportation Needs (03/18/2022)   PRAPARE - Hydrologist (Medical): No    Lack of Transportation (Non-Medical): No  Physical Activity: Insufficiently Active (03/18/2022)   Exercise Vital Sign    Days of Exercise per Week: 7 days    Minutes of Exercise per Session: 10 min  Stress: No Stress Concern Present (03/18/2022)   [place]    Feeling of Stress : Not at all  Social Connections: Moderately Integrated (03/14/2021)   Social Connection and Isolation Panel [NHANES]    Frequency of Communication with Friends and Family: Twice a week    Frequency of Social Gatherings with Friends and Family: Twice a week    Attends Religious Services: More than 4 times per year    Active Member of [employer] or Organizations: Yes    Attends Archivist Meetings: More than 4 times per year    Marital Status: Widowed    Tobacco Counseling Counseling given: Not Answered   Clinical Intake:  Pre-visit preparation completed: Yes  Pain : No/denies pain     Nutritional Status: BMI 25 -29 Overweight Nutritional Risks: Nausea/ vomitting/ diarrhea (gets nausea sometimes) Diabetes:  No  How often do you need to have someone help you when you read instructions, pamphlets, or other written materials from your doctor or pharmacy?: 1 - Never  Diabetic? no  Interpreter Needed?: No  Information entered by :: NAllen LPN   Activities of Daily Living    03/18/2022   10:35 AM 03/14/2022   10:34 AM  In your present state of health, do you have any difficulty performing the following activities:  Hearing? 0 0  Vision? 0 0  Difficulty concentrating or making decisions? 0 0  Walking or climbing stairs? 0 0  Dressing or bathing? 0 0  Doing errands, shopping? 0 0  Preparing Food and eating ? N N  Using the Toilet? N N  In the past six months, have you accidently leaked urine? N Y  Do you have problems with loss of bowel control? N N  Managing your Medications? N N  Managing your Finances? N N  Housekeeping or managing your Housekeeping? N N    Patient Care Team: [name], MD as PCP - General (Family Medicine) Nunzio Cobbs, MD as Consulting Physician (Obstetrics and Gynecology) Rutherford Guys, MD as Consulting Physician (Ophthalmology) Irene Shipper, MD as Consulting Physician (Gastroenterology) Leandrew Koyanagi, MD as Attending Physician (Orthopedic Surgery) Marti Sleigh, MD as Referring Physician (Gynecology)  Indicate any recent Medical Services you may have received from other than [facility] providers in the past year (date may be approximate).     Assessment:   This is a routine wellness examination for Candie.  Hearing/Vision screen Vision Screening - Comments:: Regular eye exams, Dr. Gershon Crane  Dietary issues and exercise activities discussed: Current Exercise Habits: Home exercise routine, Time (Minutes): 10, Frequency (Times/Week): 7, Weekly Exercise (Minutes/Week): 70   Goals Addressed             This Visit's Progress    Patient Stated       03/18/2022, maintain weight       Depression Screen    03/18/2022   10:35 AM  03/14/2021    9:54 AM 03/14/2021    9:50 AM 03/29/2020   10:57 AM  PHQ 2/9 Scores  PHQ - 2 Score 0 0 0 0    Fall Risk    03/18/2022   10:34 AM 03/14/2022   10:34 AM 03/14/2021    9:54 AM 10/12/2020    8:06 AM 03/29/2020   10:57 AM  Fall Risk   Falls in the past year? 0 0 1 0 1  Number falls in past yr: 0 0 1 0 1  Injury with Fall? 0 0 1 0 0  Comment   bruises    Risk for fall due to : Medication side effect   No Fall Risks   Follow up Falls prevention discussed;Education provided;Falls evaluation completed        FALL RISK PREVENTION PERTAINING TO THE HOME:  Any stairs in or around the home? Yes  If so, are there any without handrails? No  Home free of loose throw rugs in walkways, pet beds, electrical cords, etc? Yes  Adequate lighting in your home to reduce risk of falls? Yes   ASSISTIVE DEVICES UTILIZED TO PREVENT FALLS:  Life alert? Yes  Use of a cane, walker or w/c? No  Grab bars in the bathroom? Yes  Shower chair or bench in shower? Yes  Elevated toilet seat or a handicapped toilet? Yes   TIMED UP AND GO:  Was the test performed? Yes .  Length of time to ambulate 10 feet: 5 sec.   Gait slow and steady without use of assistive device  Cognitive Function:        03/18/2022   10:36 AM  6CIT Screen  What Year? 0 points  What month? 0 points  What time? 0 points  Count back from 20 0 points  Months in reverse 0 points  Repeat phrase 0 points  Total Score 0 points  Immunizations Immunization History  Administered Date(s) Administered   [employer] Vaccine Bivalent Booster 48yr & up 12/26/2020   PFIZER Comirnaty(Gray Top)Covid-19 Tri-Sucrose Vaccine 05/12/2019, 06/13/2019   Td 03/04/2016    TDAP status: Up to date  Flu Vaccine status: Declined, Education has been provided regarding the importance of this vaccine but patient still declined. Advised may receive this vaccine at local pharmacy or Health Dept. Aware to provide a copy of the vaccination  record if obtained from local pharmacy or Health Dept. Verbalized acceptance and understanding.  Pneumococcal vaccine status: Declined,  Education has been provided regarding the importance of this vaccine but patient still declined. Advised may receive this vaccine at local pharmacy or Health Dept. Aware to provide a copy of the vaccination record if obtained from local pharmacy or Health Dept. Verbalized acceptance and understanding.   Covid-19 vaccine status: Completed vaccines  Qualifies for Shingles Vaccine? Yes   Zostavax completed No   Shingrix Completed?: No.    Education has been provided regarding the importance of this vaccine. Patient has been advised to call insurance company to determine out of pocket expense if they have not yet received this vaccine. Advised may also receive vaccine at local pharmacy or Health Dept. Verbalized acceptance and understanding.  Screening Tests Health Maintenance  Topic Date Due   Hepatitis C Screening  Never done   MAMMOGRAM  02/21/2022   COVID-19 Vaccine (4 - 2023-24 season) 04/03/2022 (Originally 11/02/2021)   INFLUENZA VACCINE  06/02/2022 (Originally 10/02/2021)   Zoster Vaccines- Shingrix (1 of 2) 06/17/2022 (Originally 11/11/1969)   Pneumonia Vaccine 71 Years old (1 - PCV) 03/19/2023 (Originally 11/10/2016)   Medicare Annual Wellness (AWV)  03/19/2023   COLONOSCOPY (Pts 45-432yrInsurance coverage will need to be confirmed)  09/22/2025   DTaP/Tdap/Td (2 - Tdap) 03/04/2026   DEXA SCAN  Completed   HPV VACCINES  Aged Out    Health Maintenance  Health Maintenance Due  Topic Date Due   Hepatitis C Screening  Never done   MAMMOGRAM  02/21/2022    Colorectal cancer screening: Type of screening: Colonoscopy. Completed 09/22/2020. Repeat every 5 years  Mammogram status: Completed 02/21/2021. Repeat every year  Bone Density status: Completed 09/06/2020  Lung Cancer Screening: (Low Dose CT Chest recommended if Age 71-80ears, 30 pack-year  currently smoking OR have quit w/in 15years.) does not qualify.   Lung Cancer Screening Referral: no  Additional Screening:  Hepatitis C Screening: does qualify;   Vision Screening: Recommended annual ophthalmology exams for early detection of glaucoma and other disorders of the eye. Is the patient up to date with their annual eye exam?  Yes  Who is the provider or what is the name of the office in which the patient attends annual eye exams? Dr. ShGershon Cranef pt is not established with a provider, would they like to be referred to a provider to establish care? No .   Dental Screening: Recommended annual dental exams for proper oral hygiene  Community Resource Referral / Chronic Care Management: CRR required this visit?  No   CCM required this visit?  No      Plan:     I have personally reviewed and noted the following in the patient's chart:   Medical and social history Use of alcohol, tobacco or illicit drugs  Current medications and supplements including opioid prescriptions. Patient is not currently taking opioid prescriptions. Functional ability and status Nutritional status Physical activity Advanced directives List of other physicians Hospitalizations, surgeries, and ER  visits in previous 12 months Vitals Screenings to include cognitive, depression, and falls Referrals and appointments  In addition, I have reviewed and discussed with patient certain preventive protocols, quality metrics, and best practice recommendations. A written personalized care plan for preventive services as well as general preventive health recommendations were provided to patient.     Kellie Simmering, LPN   [id number]   Nurse Notes: none

## 2022-03-18 NOTE — Patient Instructions (Addendum)
[name] , Thank you for taking time to come for your Medicare Wellness Visit. I appreciate your ongoing commitment to your health goals. Please review the following plan we discussed and let me know if I can assist you in the future.   These are the goals we discussed:  Goals      Patient Stated     03/18/2022, maintain weight        This is a list of the screening recommended for you and due dates:  Health Maintenance  Topic Date Due   Hepatitis C Screening: USPSTF Recommendation to screen - Ages 33-79 yo.  Never done   Mammogram  02/21/2022   COVID-19 Vaccine (4 - 2023-24 season) 04/03/2022*   Flu Shot  06/02/2022*   Zoster (Shingles) Vaccine (1 of 2) 06/17/2022*   Pneumonia Vaccine (1 - PCV) 03/19/2023*   Medicare Annual Wellness Visit  03/19/2023   Colon Cancer Screening  09/22/2025   DTaP/Tdap/Td vaccine (2 - Tdap) 03/04/2026   DEXA scan (bone density measurement)  Completed   HPV Vaccine  Aged Out  *Topic was postponed. The date shown is not the original due date.    Advanced directives: Please bring a copy of your POA (Power of Attorney) and/or Living Will to your next appointment.   Conditions/risks identified: none  Next appointment: Follow up in one year for your annual wellness visit    Preventive Care 65 Years and Older, Female Preventive care refers to lifestyle choices and visits with your health care provider that can promote health and wellness. What does preventive care include? A yearly physical exam. This is also called an annual well check. Dental exams once or twice a year. Routine eye exams. Ask your health care provider how often you should have your eyes checked. Personal lifestyle choices, including: Daily care of your teeth and gums. Regular physical activity. Eating a healthy diet. Avoiding tobacco and drug use. Limiting alcohol use. Practicing safe sex. Taking low-dose aspirin every day. Taking vitamin and mineral supplements as  recommended by your health care provider. What happens during an annual well check? The services and screenings done by your health care provider during your annual well check will depend on your age, overall health, lifestyle risk factors, and family history of disease. Counseling  Your health care provider may ask you questions about your: Alcohol use. Tobacco use. Drug use. Emotional well-being. Home and relationship well-being. Sexual activity. Eating habits. History of falls. Memory and ability to understand (cognition). Work and work Statistician. Reproductive health. Screening  You may have the following tests or measurements: Height, weight, and BMI. Blood pressure. Lipid and cholesterol levels. These may be checked every 5 years, or more frequently if you are over 73 years old. Skin check. Lung cancer screening. You may have this screening every year starting at age 67 if you have a 30-pack-year history of smoking and currently smoke or have quit within the past 15 years. Fecal occult blood test (FOBT) of the stool. You may have this test every year starting at age 85. Flexible sigmoidoscopy or colonoscopy. You may have a sigmoidoscopy every 5 years or a colonoscopy every 10 years starting at age 23. Hepatitis C blood test. Hepatitis B blood test. Sexually transmitted disease (STD) testing. Diabetes screening. This is done by checking your blood sugar (glucose) after you have not eaten for a while (fasting). You may have this done every 1-3 years. Bone density scan. This is done to screen for osteoporosis. You  may have this done starting at age 45. Mammogram. This may be done every 1-2 years. Talk to your health care provider about how often you should have regular mammograms. Talk with your health care provider about your test results, treatment options, and if necessary, the need for more tests. Vaccines  Your health care provider may recommend certain vaccines, such  as: Influenza vaccine. This is recommended every year. Tetanus, diphtheria, and acellular pertussis (Tdap, Td) vaccine. You may need a Td booster every 10 years. Zoster vaccine. You may need this after age 67. Pneumococcal 13-valent conjugate (PCV13) vaccine. One dose is recommended after age 2. Pneumococcal polysaccharide (PPSV23) vaccine. One dose is recommended after age 23. Talk to your health care provider about which screenings and vaccines you need and how often you need them. This information is not intended to replace advice given to you by your health care provider. Make sure you discuss any questions you have with your health care provider. Document Released: 03/17/2015 Document Revised: 11/08/2015 Document Reviewed: 12/20/2014 Elsevier Interactive Patient Education  2017 [place] Prevention in the Home Falls can cause injuries. They can happen to people of all ages. There are many things you can do to make your home safe and to help prevent falls. What can I do on the outside of my home? Regularly fix the edges of walkways and driveways and fix any cracks. Remove anything that might make you trip as you walk through a door, such as a raised step or threshold. Trim any bushes or trees on the path to your home. Use bright outdoor lighting. Clear any walking paths of anything that might make someone trip, such as rocks or tools. Regularly check to see if handrails are loose or broken. Make sure that both sides of any steps have handrails. Any raised decks and porches should have guardrails on the edges. Have any leaves, snow, or ice cleared regularly. Use sand or salt on walking paths during winter. Clean up any spills in your garage right away. This includes oil or grease spills. What can I do in the bathroom? Use night lights. Install grab bars by the toilet and in the tub and shower. Do not use towel bars as grab bars. Use non-skid mats or decals in the tub or  shower. If you need to sit down in the shower, use a plastic, non-slip stool. Keep the floor dry. Clean up any water that spills on the floor as soon as it happens. Remove soap buildup in the tub or shower regularly. Attach bath mats securely with double-sided non-slip rug tape. Do not have throw rugs and other things on the floor that can make you trip. What can I do in the bedroom? Use night lights. Make sure that you have a light by your bed that is easy to reach. Do not use any sheets or blankets that are too big for your bed. They should not hang down onto the floor. Have a firm chair that has side arms. You can use this for support while you get dressed. Do not have throw rugs and other things on the floor that can make you trip. What can I do in the kitchen? Clean up any spills right away. Avoid walking on wet floors. Keep items that you use a lot in easy-to-reach places. If you need to reach something above you, use a strong step stool that has a grab bar. Keep electrical cords out of the way. Do not use floor  polish or wax that makes floors slippery. If you must use wax, use non-skid floor wax. Do not have throw rugs and other things on the floor that can make you trip. What can I do with my stairs? Do not leave any items on the stairs. Make sure that there are handrails on both sides of the stairs and use them. Fix handrails that are broken or loose. Make sure that handrails are as long as the stairways. Check any carpeting to make sure that it is firmly attached to the stairs. Fix any carpet that is loose or worn. Avoid having throw rugs at the top or bottom of the stairs. If you do have throw rugs, attach them to the floor with carpet tape. Make sure that you have a light switch at the top of the stairs and the bottom of the stairs. If you do not have them, ask someone to add them for you. What else can I do to help prevent falls? Wear shoes that: Do not have high heels. Have  rubber bottoms. Are comfortable and fit you well. Are closed at the toe. Do not wear sandals. If you use a stepladder: Make sure that it is fully opened. Do not climb a closed stepladder. Make sure that both sides of the stepladder are locked into place. Ask someone to hold it for you, if possible. Clearly mark and make sure that you can see: Any grab bars or handrails. First and last steps. Where the edge of each step is. Use tools that help you move around (mobility aids) if they are needed. These include: Canes. Walkers. Scooters. Crutches. Turn on the lights when you go into a dark area. Replace any light bulbs as soon as they burn out. Set up your furniture so you have a clear path. Avoid moving your furniture around. If any of your floors are uneven, fix them. If there are any pets around you, be aware of where they are. Review your medicines with your doctor. Some medicines can make you feel dizzy. This can increase your chance of falling. Ask your doctor what other things that you can do to help prevent falls. This information is not intended to replace advice given to you by your health care provider. Make sure you discuss any questions you have with your health care provider. Document Released: 12/15/2008 Document Revised: 07/27/2015 Document Reviewed: 03/25/2014 Elsevier Interactive Patient Education  2017 [employer].

## 2022-03-26 ENCOUNTER — Other Ambulatory Visit: Payer: Self-pay | Admitting: Family Medicine

## 2022-03-26 DIAGNOSIS — Z1231 Encounter for screening mammogram for malignant neoplasm of breast: Secondary | ICD-10-CM

## 2022-05-14 ENCOUNTER — Ambulatory Visit
Admission: RE | Admit: 2022-05-14 | Discharge: 2022-05-14 | Disposition: A | Discharge status: Home / Self Care | Payer: PPO | Source: Ambulatory Visit | Attending: Family Medicine | Admitting: Family Medicine

## 2022-05-14 DIAGNOSIS — Z1231 Encounter for screening mammogram for malignant neoplasm of breast: Secondary | ICD-10-CM

## 2022-05-16 ENCOUNTER — Encounter: Payer: Self-pay | Admitting: Family Medicine

## 2022-05-17 ENCOUNTER — Other Ambulatory Visit: Payer: Self-pay | Admitting: Family Medicine

## 2022-05-17 ENCOUNTER — Encounter: Payer: Self-pay | Admitting: Family Medicine

## 2022-05-17 DIAGNOSIS — R928 Other abnormal and inconclusive findings on diagnostic imaging of breast: Secondary | ICD-10-CM

## 2022-05-22 ENCOUNTER — Encounter: Payer: Self-pay | Admitting: Family Medicine

## 2022-05-22 ENCOUNTER — Ambulatory Visit (INDEPENDENT_AMBULATORY_CARE_PROVIDER_SITE_OTHER): Payer: PPO | Admitting: Family Medicine

## 2022-05-22 VITALS — BP 124/68 | HR 78 | Temp 97.7°F | Ht 61.5 in | Wt 144.2 lb

## 2022-05-22 DIAGNOSIS — E663 Overweight: Secondary | ICD-10-CM | POA: Diagnosis not present

## 2022-05-22 DIAGNOSIS — I10 Essential (primary) hypertension: Secondary | ICD-10-CM

## 2022-05-22 DIAGNOSIS — K219 Gastro-esophageal reflux disease without esophagitis: Secondary | ICD-10-CM | POA: Diagnosis not present

## 2022-05-22 DIAGNOSIS — F411 Generalized anxiety disorder: Secondary | ICD-10-CM

## 2022-05-22 DIAGNOSIS — E039 Hypothyroidism, unspecified: Secondary | ICD-10-CM

## 2022-05-22 MED ORDER — PROMETHAZINE HCL 25 MG PO TABS: 25.0000 mg | ORAL_TABLET | Freq: Four times a day (QID) | ORAL | 2 refills | Status: DC | PRN

## 2022-05-22 MED ORDER — DIAZEPAM 5 MG PO TABS: ORAL_TABLET | ORAL | 1 refills | Status: DC

## 2022-05-22 NOTE — Assessment & Plan Note (Signed)
Stable. Continue bupropion SR 150 mg bid and PRN Valium.

## 2022-05-22 NOTE — Assessment & Plan Note (Signed)
I will renew her diazepam.

## 2022-05-22 NOTE — Assessment & Plan Note (Signed)
Stable.  Continue levothyroxine 100mcg daily

## 2022-05-22 NOTE — Progress Notes (Signed)
[facility] [address] [place] [address] Dept: [phone] Dept Fax: [phone]  Chronic Care Office Visit  Subjective:    Patient ID: Kimberly [name], female    DOB: Oct 18, 1950, 72 y.o..   MRN: [id number]  Chief Complaint  Patient presents with   Medical Management of Chronic Issues    3 month f/u.     History of Present Illness:  Patient is in today for reassessment of chronic medical issues.  Ms. Holliday has a history of hypertension and is managed on metoprolol XL 25 mg daily.    Ms. Heller has hypothyroidism and is on levothyroxine 100 mcg daily.   Ms. Inclan notes that she did receive 3 months of treatment with a compounded semaglutide through a weight loss center in [place]. She is pleased to have had the weight loss she has and has since stopped the medication.   Ms. Lab has a history of anxiety, She is managed on bupropion SR 150 mg bid and diazepam 5 mg 1/2-1 tab qhs. She has been judicious with the use of the diazepam.  Ms. Kovacevich has a history of GERD. She has occasionally nausea associated with this and keeps promethazine on hand for managing this.  Past Medical History: Patient Active Problem List   Diagnosis Date Noted   Incontinence of feces with fecal urgency 03/23/2021   SUI (stress urinary incontinence, female) 03/23/2021   Overweight (BMI 25.0-29.9) 10/12/2020   Hiatal hernia 09/22/2020   GERD (gastroesophageal reflux disease) 09/22/2020   Diverticulosis 09/22/2020   Osteopenia 09/08/2020   Restless leg syndrome 06/05/2020   Pelvic organ prolapse quantification stage 2 rectocele 05/31/2020   Hypothyroidism 03/29/2020   Essential hypertension 03/29/2020   Kidney stones 03/29/2020   Asthma 03/29/2020   Anxiety disorder 03/29/2020   Past Surgical History:  Procedure Laterality Date   ANTERIOR AND POSTERIOR VAGINAL REPAIR W/ SACROSPINOUS LIGAMENT SUSPENSION  06/29/2021    PARTIAL HYSTERECTOMY  1980   Pre-cancerous condition. Failed laser surgery. still have ovaries   UMBILICAL HERNIA REPAIR     with mesh   WRIST SURGERY Left 2019   Family History  Problem Relation Age of Onset   Diabetes Mother    Breast cancer Mother    Colon cancer Father    Kidney failure Father    COPD Brother    Diabetes Brother    Heart disease Brother    Heart disease Maternal Uncle    Heart disease Daughter    Depression Son    Esophageal cancer Neg Hx    Stomach cancer Neg Hx    Rectal cancer Neg Hx    Outpatient Medications Prior to Visit  Medication Sig Dispense Refill   albuterol (VENTOLIN HFA) 108 (90 Base) MCG/ACT inhaler INHALE 2 PUFFS BY MOUTH EVERY 4 HOURS AS NEEDED 8.5 g 3   aspirin 81 MG chewable tablet Chew 81 mg by mouth daily.     Biotin 3 MG TABS Take by mouth.     buPROPion (WELLBUTRIN SR) 150 MG 12 hr tablet TAKE 1 TABLET(150 MG) BY MOUTH TWICE DAILY 180 tablet 1   cholecalciferol (VITAMIN D3) 25 MCG (1000 UNIT) tablet      Cranberry 1000 MG CAPS Take by mouth.     levothyroxine (SYNTHROID) 100 MCG tablet TAKE 1 TABLET(100 MCG) BY MOUTH DAILY 90 tablet 3   metoprolol succinate (TOPROL-XL) 25 MG 24 hr tablet Take 1 tablet (25 mg total) by mouth daily. 90 tablet 3  rizatriptan (MAXALT) 5 MG tablet Take 1 tablet (5 mg total) by mouth as needed for migraine. May repeat in 2 hours if needed 10 tablet 1   rOPINIRole (REQUIP) 0.5 MG tablet Take 1 tablet (0.5 mg total) by mouth at bedtime. (Patient taking differently: Take 0.5 mg by mouth at bedtime. Takes as needed) 30 tablet 5   diazepam (VALIUM) 5 MG tablet TAKE 1/2 TO 1 TABLET(2.5 TO 5 MG) BY MOUTH IN THE MORNING AND AT BEDTIME. MAY TAKE AN ADDITIONAL DOSE DAILY AS NEEDED 60 tablet 1   promethazine (PHENERGAN) 25 MG tablet Take 1 tablet (25 mg total) by mouth every 6 (six) hours as needed for nausea or vomiting. 30 tablet 2   phentermine (ADIPEX-P) 37.5 MG tablet Take 37.5 mg by mouth daily. (Patient not  taking: Reported on 05/22/2022)     No facility-administered medications prior to visit.   Allergies  Allergen Reactions   Codeine Hives   Sulfa Antibiotics Hives and Itching   Objective:   Today's Vitals   05/22/22 1022  BP: 124/68  Pulse: 78  Temp: 97.7 F (36.5 C)  TempSrc: Temporal  SpO2: 97%  Weight: 144 lb 3.2 oz (65.4 kg)  Height: 5' 1.5" (1.562 m)   Body mass index is 26.81 kg/m.   General: Well developed, well nourished. No acute distress. Psych: Alert and oriented. Normal mood and affect.  Health Maintenance Due  Topic Date Due   Hepatitis C Screening  Never done   COVID-19 Vaccine (4 - 2023-24 season) 11/02/2021     Assessment & Plan:   Problem List Items Addressed This Visit       Cardiovascular and Mediastinum   Essential hypertension - Primary    Blood pressure is in good control. Continue metoprolol succinate 25 mg daily.        Digestive   GERD (gastroesophageal reflux disease)    I will renew her diazepam.      Relevant Medications   promethazine (PHENERGAN) 25 MG tablet     Endocrine   Hypothyroidism    Stable. Continue levothyroxine 100 mcg daily.        Other   Anxiety disorder    Stable. Continue bupropion SR 150 mg bid and PRN Valium.      Relevant Medications   diazepam (VALIUM) 5 MG tablet   Overweight (BMI 25.0-29.9)    Maximum weight: 176 lbs (01/2021) Current weight: 144 lbs Weight change since last visit: - 9 lbs Total weight loss: 32 lbs (18.2%)  Ms. Baab has met her weight loss goals and will work to maintain this.       Return in about 3 months (around 08/22/2022) for Reassessment.   [name], MD

## 2022-05-22 NOTE — Assessment & Plan Note (Signed)
Maximum weight: 176 lbs (01/2021) Current weight: 144 lbs Weight change since last visit: - 9 lbs Total weight loss: 32 lbs (18.2%)  Kimberly [name] has met her weight loss goals and will work to maintain this.

## 2022-05-22 NOTE — Assessment & Plan Note (Signed)
Blood pressure is in good control. Continue metoprolol succinate 25 mg daily.  

## 2022-05-23 ENCOUNTER — Ambulatory Visit
Admission: RE | Admit: 2022-05-23 | Discharge: 2022-05-23 | Disposition: A | Discharge status: Home / Self Care | Payer: PPO | Source: Ambulatory Visit | Attending: Family Medicine | Admitting: Family Medicine

## 2022-05-23 DIAGNOSIS — R928 Other abnormal and inconclusive findings on diagnostic imaging of breast: Secondary | ICD-10-CM

## 2022-05-29 ENCOUNTER — Other Ambulatory Visit: Payer: Self-pay | Admitting: Family Medicine

## 2022-05-29 DIAGNOSIS — F411 Generalized anxiety disorder: Secondary | ICD-10-CM

## 2022-06-05 ENCOUNTER — Encounter: Payer: Self-pay | Admitting: Family Medicine

## 2022-06-06 NOTE — Telephone Encounter (Signed)
Patient scheduled for 06/07/22 @ 1:20. Dm/cma

## 2022-06-06 NOTE — Telephone Encounter (Signed)
Left FM to rtn call to schedule an appointment. Dm/cma

## 2022-06-07 ENCOUNTER — Ambulatory Visit (INDEPENDENT_AMBULATORY_CARE_PROVIDER_SITE_OTHER): Payer: PPO

## 2022-06-07 ENCOUNTER — Ambulatory Visit (INDEPENDENT_AMBULATORY_CARE_PROVIDER_SITE_OTHER): Payer: PPO | Admitting: Family Medicine

## 2022-06-07 VITALS — BP 134/72 | HR 80 | Temp 97.6°F | Ht 61.5 in | Wt 144.8 lb

## 2022-06-07 DIAGNOSIS — M898X1 Other specified disorders of bone, shoulder: Secondary | ICD-10-CM | POA: Diagnosis not present

## 2022-06-07 NOTE — Progress Notes (Signed)
[facility] [address] [place] [id number] Dept: [phone] Dept Fax: [phone]  Office Visit  Subjective:    Patient ID: Kimberly [name], female    DOB: 02/04/1951, 72 y.o..   MRN: [id number]  Chief Complaint  Patient presents with   Shoulder Pain    C/o having Rt shoulder/clavicle pain.      History of Present Illness:  Patient is in today complaining of ongoing issues with her right clavicle/shoulder. Several years ago, she had a fall that fractured her right humerus. She believes she also injured her collar bone, but that this was not discovered. She has had more recent issues with right neck pain at times. She thinks this may all be related somehow.  Past Medical History: Patient Active Problem List   Diagnosis Date Noted   Incontinence of feces with fecal urgency 03/23/2021   SUI (stress urinary incontinence, female) 03/23/2021   Overweight (BMI 25.0-29.9) 10/12/2020   Hiatal hernia 09/22/2020   GERD (gastroesophageal reflux disease) 09/22/2020   Diverticulosis 09/22/2020   Osteopenia 09/08/2020   Restless leg syndrome 06/05/2020   Pelvic organ prolapse quantification stage 2 rectocele 05/31/2020   Hypothyroidism 03/29/2020   Essential hypertension 03/29/2020   Kidney stones 03/29/2020   Asthma 03/29/2020   Anxiety disorder 03/29/2020   Past Surgical History:  Procedure Laterality Date   ANTERIOR AND POSTERIOR VAGINAL REPAIR W/ SACROSPINOUS LIGAMENT SUSPENSION  06/29/2021   PARTIAL HYSTERECTOMY  1980   Pre-cancerous condition. Failed laser surgery. still have ovaries   UMBILICAL HERNIA REPAIR     with mesh   WRIST SURGERY Left 2019   Family History  Problem Relation Age of Onset   Diabetes Mother    Breast cancer Mother    Colon cancer Father    Kidney failure Father    COPD Brother    Diabetes Brother    Heart disease Brother    Heart disease Maternal Uncle    Heart disease Daughter     Depression Son    Esophageal cancer Neg Hx    Stomach cancer Neg Hx    Rectal cancer Neg Hx    Outpatient Medications Prior to Visit  Medication Sig Dispense Refill   albuterol (VENTOLIN HFA) 108 (90 Base) MCG/ACT inhaler INHALE 2 PUFFS BY MOUTH EVERY 4 HOURS AS NEEDED 8.5 g 3   aspirin 81 MG chewable tablet Chew 81 mg by mouth daily.     Biotin 3 MG TABS Take by mouth.     buPROPion (WELLBUTRIN SR) 150 MG 12 hr tablet TAKE 1 TABLET(150 MG) BY MOUTH TWICE DAILY 180 tablet 3   cholecalciferol (VITAMIN D3) 25 MCG (1000 UNIT) tablet      Cranberry 1000 MG CAPS Take by mouth.     diazepam (VALIUM) 5 MG tablet TAKE 1/2 TO 1 TABLET(2.5 TO 5 MG) BY MOUTH IN THE MORNING AND AT BEDTIME. MAY TAKE AN ADDITIONAL DOSE DAILY AS NEEDED 60 tablet 1   levothyroxine (SYNTHROID) 100 MCG tablet TAKE 1 TABLET(100 MCG) BY MOUTH DAILY 90 tablet 3   metoprolol succinate (TOPROL-XL) 25 MG 24 hr tablet Take 1 tablet (25 mg total) by mouth daily. 90 tablet 3   promethazine (PHENERGAN) 25 MG tablet Take 1 tablet (25 mg total) by mouth every 6 (six) hours as needed for nausea or vomiting. 30 tablet 2   rizatriptan (MAXALT) 5 MG tablet Take 1 tablet (5 mg total) by mouth as needed for migraine. May repeat in 2 hours  if needed 10 tablet 1   rOPINIRole (REQUIP) 0.5 MG tablet Take 1 tablet (0.5 mg total) by mouth at bedtime. (Patient taking differently: Take 0.5 mg by mouth at bedtime. Takes as needed) 30 tablet 5   No facility-administered medications prior to visit.   Allergies  Allergen Reactions   Codeine Hives   Sulfa Antibiotics Hives and Itching     Objective:   Today's Vitals   06/07/22 1327  BP: 134/72  Pulse: 80  Temp: 97.6 F (36.4 C)  TempSrc: Temporal  SpO2: 99%  Weight: 144 lb 12.8 oz (65.7 kg)  Height: 5' 1.5" (1.562 m)   Body mass index is 26.92 kg/m.   General: Well developed, well nourished. No acute distress. Extremities: Full ROM of right arm. No deformity noted to the right clavicle.  The right A-c joint is not swollen. There   is no tenderness to palpation. She does nto have evidence of a sprain. Psych: Alert and oriented. Normal mood and affect.  Health Maintenance Due  Topic Date Due   Hepatitis C Screening  Never done     Assessment & Plan:   Problem List Items Addressed This Visit   None Visit Diagnoses     Pain of right clavicle    -  Primary   I don't see any abnormality on exam. I will check an x-ray to see if there is any bony issue.   Relevant Orders   DG Clavicle Right       Return for Follow-up after testing/imaging.   Loyola Mast, MD

## 2022-06-24 ENCOUNTER — Encounter: Payer: Self-pay | Admitting: Family Medicine

## 2022-06-24 DIAGNOSIS — R110 Nausea: Secondary | ICD-10-CM

## 2022-06-25 MED ORDER — ONDANSETRON HCL 4 MG PO TABS: 4.0000 mg | ORAL_TABLET | Freq: Three times a day (TID) | ORAL | 0 refills | Status: DC | PRN

## 2022-07-04 ENCOUNTER — Telehealth: Payer: Self-pay

## 2022-07-04 NOTE — Telephone Encounter (Signed)
PA for zofran submitted through cover may meds. Awaiting response.  Dm/cma   Key: [id number]

## 2022-07-04 NOTE — Telephone Encounter (Signed)
PA for Zofran not covered by insurance. Dm/cma

## 2022-07-12 ENCOUNTER — Ambulatory Visit (INDEPENDENT_AMBULATORY_CARE_PROVIDER_SITE_OTHER): Payer: PPO | Admitting: Nurse Practitioner

## 2022-07-12 ENCOUNTER — Encounter: Payer: Self-pay | Admitting: Nurse Practitioner

## 2022-07-12 VITALS — BP 140/84 | Temp 100.5°F | Ht 61.5 in | Wt 146.0 lb

## 2022-07-12 DIAGNOSIS — R509 Fever, unspecified: Secondary | ICD-10-CM | POA: Diagnosis not present

## 2022-07-12 DIAGNOSIS — R059 Cough, unspecified: Secondary | ICD-10-CM | POA: Diagnosis not present

## 2022-07-12 DIAGNOSIS — U071 COVID-19: Secondary | ICD-10-CM | POA: Diagnosis not present

## 2022-07-12 LAB — POCT INFLUENZA A/B
Influenza A, POC: NEGATIVE
Influenza B, POC: NEGATIVE

## 2022-07-12 LAB — POC COVID19 BINAXNOW: SARS Coronavirus 2 Ag: POSITIVE — AB

## 2022-07-12 MED ORDER — HYDROCOD POLI-CHLORPHE POLI ER 10-8 MG/5ML PO SUER: 5.0000 mL | Freq: Two times a day (BID) | ORAL | 0 refills | Status: DC | PRN

## 2022-07-12 MED ORDER — NIRMATRELVIR/RITONAVIR (PAXLOVID)TABLET: 3.0000 | ORAL_TABLET | Freq: Two times a day (BID) | ORAL | 0 refills | Status: AC

## 2022-07-12 NOTE — Progress Notes (Signed)
Acute Office Visit  Subjective:     Patient ID: Kimberly [name], female    DOB: 1951/03/04, 72 y.o.   MRN: [id number]  Chief Complaint  Patient presents with   Cough    Productive cough that started yesterday with fever    HPI Patient is in today for cough and fever that started yesterday.  UPPER RESPIRATORY TRACT INFECTION  Fever: yes 99 Cough: yes - productive, thick, gray Shortness of breath: no Wheezing: yes Chest pain: yes, with cough Chest tightness: yes Chest congestion: yes Nasal congestion: no Runny nose: no Post nasal drip: yes Sneezing: no Sore throat: yes Swollen glands: no Sinus pressure: yes Headache: no Face pain: no Toothache: no Ear pain: yes "right Ear pressure: no bilateral Eyes red/itching:no Eye drainage/crusting: no  Vomiting: no Rash: no Fatigue: yes Sick contacts: yes Strep contacts: no  Context: worse Recurrent sinusitis: no Relief with OTC cold/cough medications:  n/a   Treatments attempted: nothing   ROS See pertinent positives and negatives per HPI.     Objective:    BP (!) 140/84 (BP Location: Left Arm)   Temp (!) 100.5 F (38.1 C)   Ht 5' 1.5" (1.562 m)   Wt 146 lb (66.2 kg)   SpO2 93%   BMI 27.14 kg/m    Physical Exam Vitals and nursing note reviewed.  Constitutional:      General: She is not in acute distress.    Appearance: Normal appearance.  HENT:     Head: Normocephalic.     Right Ear: Tympanic membrane, ear canal and external ear normal.     Left Ear: Tympanic membrane, ear canal and external ear normal.     Nose:     Right Sinus: No maxillary sinus tenderness or frontal sinus tenderness.     Left Sinus: No maxillary sinus tenderness or frontal sinus tenderness.     Mouth/Throat:     Pharynx: No posterior oropharyngeal erythema.  Eyes:     Conjunctiva/sclera: Conjunctivae normal.  Cardiovascular:     Rate and Rhythm: Normal rate and regular rhythm.     Pulses: Normal pulses.     Heart sounds:  Normal heart sounds.  Pulmonary:     Effort: Pulmonary effort is normal.     Breath sounds: Normal breath sounds.  Musculoskeletal:     Cervical back: Normal range of motion and neck supple. No tenderness.  Lymphadenopathy:     Cervical: No cervical adenopathy.  Skin:    General: Skin is warm.  Neurological:     General: No focal deficit present.     Mental Status: She is alert and oriented to person, place, and time.  Psychiatric:        Mood and Affect: Mood normal.        Behavior: Behavior normal.        Thought Content: Thought content normal.        Judgment: Judgment normal.     Results for orders placed or performed in visit on 07/12/22  POC COVID-19  Result Value Ref Range   SARS Coronavirus 2 Ag Positive (A) Negative  POCT Influenza A/B  Result Value Ref Range   Influenza A, POC Negative Negative   Influenza B, POC Negative Negative        Assessment & Plan:   Problem List Items Addressed This Visit       Other   COVID-19 - Primary    Will treat with paxlovid twice a day for 5 days.  Encourage fluids, rest. Tussionex prn cough, may make her sleepy.   Reviewed home care instructions for COVID. Advised self-isolation at home for at least 5 days. After 5 days, if improved and fever resolved, can be in public, but should wear a mask around others for an additional 5 days. If symptoms, esp, dyspnea develops/worsens, recommend in-person evaluation at either an urgent care or the emergency room.       Relevant Medications   nirmatrelvir/ritonavir (PAXLOVID) 20 x 150 MG & 10 x 100MG  TABS   Other Visit Diagnoses     Cough with fever       POC covid positive, flu negative. Will treat cough with tussionex. PDMP reviewed.   Relevant Orders   POC COVID-19 (Completed)   POCT Influenza A/B (Completed)       Meds ordered this encounter  Medications   nirmatrelvir/ritonavir (PAXLOVID) 20 x 150 MG & 10 x 100MG  TABS    Sig: Take 3 tablets by mouth 2 (two) times  daily for 5 days. (Take nirmatrelvir 150 mg two tablets twice daily for 5 days and ritonavir 100 mg one tablet twice daily for 5 days) Patient GFR is 79    Dispense:  30 tablet    Refill:  0   chlorpheniramine-HYDROcodone (TUSSIONEX) 10-8 MG/5ML    Sig: Take 5 mLs by mouth every 12 (twelve) hours as needed for cough.    Dispense:  70 mL    Refill:  0    Return if symptoms worsen or fail to improve.  Gerre Scull, NP

## 2022-07-12 NOTE — Patient Instructions (Signed)
It was great to see you!  Start paxlovid 3 capsules twice a day for 5 days.   Start tussionex twice a day as needed for cough, this may make you sleepy.   Drink plenty of fluids and get rest.   Stay away from others for 5 days, and then wear a mask for another 5 days.   Let's follow-up if your symptoms worsen or don't improve.   Take care,  Rodman Pickle, NP

## 2022-07-12 NOTE — Assessment & Plan Note (Signed)
Will treat with paxlovid twice a day for 5 days. Encourage fluids, rest. Tussionex prn cough, may make her sleepy.   Reviewed home care instructions for COVID. Advised self-isolation at home for at least 5 days. After 5 days, if improved and fever resolved, can be in public, but should wear a mask around others for an additional 5 days. If symptoms, esp, dyspnea develops/worsens, recommend in-person evaluation at either an urgent care or the emergency room.

## 2022-07-15 ENCOUNTER — Encounter: Payer: Self-pay | Admitting: Family Medicine

## 2022-07-15 ENCOUNTER — Ambulatory Visit (INDEPENDENT_AMBULATORY_CARE_PROVIDER_SITE_OTHER): Payer: PPO | Admitting: Family Medicine

## 2022-07-15 VITALS — HR 82 | Temp 97.9°F | Ht 61.5 in | Wt 141.6 lb

## 2022-07-15 DIAGNOSIS — B309 Viral conjunctivitis, unspecified: Secondary | ICD-10-CM

## 2022-07-15 DIAGNOSIS — U071 COVID-19: Secondary | ICD-10-CM

## 2022-07-15 NOTE — Patient Instructions (Signed)
Viral Conjunctivitis, Adult  Viral conjunctivitis is an inflammation of the conjunctiva. The conjunctiva is the clear membrane that covers the white part of the eye and the inner surface of the eyelid. The inflammation is caused by a viral infection. The blood vessels in the conjunctiva become large, causing the eye to become red or pink and often itchy and tearing. The inflammation usually starts in one eye and goes to the other in a day or two. Infections usually go away over 1-2 weeks. Viral conjunctivitis is contagious. This means it can be easily passed from one person to another. This condition is often called pink eye. What are the causes? This condition is caused by a virus. It can be spread by touching objects that have been contaminated with the virus, such as doorknobs or towels, and then touching your eye. It can also be passed through tiny droplets, such as from coughing or sneezing. What increases the risk? You are more likely to develop this condition if you have a cold or the flu, or are in close contact with a person who has pink eye. What are the signs or symptoms? Symptoms of this condition include: Redness in the eye. Tearing or watery eyes. Itchy and irritated eyes. Burning feeling in the eyes. Clear drainage from the eye. Swollen eyelids. A gritty feeling in the eye. Light sensitivity. This condition often occurs with other symptoms, such as nasal congestion, cough, and fever. How is this diagnosed? This condition is diagnosed with a medical history and physical exam. If you have discharge from your eye, the discharge may be tested for a virus or to rule out other causes of conjunctivitis. How is this treated? Viral conjunctivitis does not respond to medicines that kill bacteria (antibiotics). The condition most often goes away on its own in 1-2 weeks. If treatment is needed, it is aimed at relieving your symptoms and preventing the spread of infection. This may be done  with artificial tear drops, antihistamine drops, or other eye medicines. In rare cases, steroid eye drops or anti-herpes virus medicines may be prescribed. Follow these instructions at home: Medicines  Take or apply over-the-counter and prescription medicines only as told by your health care provider. Do not touch the edge of the eyelid with the eye-drop bottle or ointment tube when applying medicines to the affected eye. This will prevent the spread of the infection to the other eye or to other people. Eye care Avoid touching or rubbing your eyes. Apply a clean, cool, wet washcloth onto your eye for 10-20 minutes, 3-4 times per day, or as told by your health care provider. If you wear contact lenses, do notwear them until the inflammation is gone and your health care provider says it is safe to wear them again. Ask your health care provider how to disinfect or replace your contact lenses before using them again. Wear glasses until you can resume wearing contacts. Avoid wearing eye makeup until the inflammation is gone. Throw away any old eye cosmetics that may be contaminated. Gently wipe away any crusting from your eye with a wet washcloth or a cotton ball. General instructions Change or wash your pillowcase every day or as told by your health care provider. Do not share towels, pillowcases, washcloths, eye makeup, makeup brushes, eye drops, contact lenses, or eyeglasses. This may spread the infection. Wash your hands often with soap and water. Use paper towels to dry your hands. If soap and water are not available, use hand sanitizer. Avoid contact   with other people until your eye is no longer red and tearing, or as told by your health care provider. Keep all follow-up visits. Contact a health care provider if: Your symptoms do not improve with treatment, or they get worse. You have increased pain. Your vision becomes blurry. You have a fever. You have facial pain, redness, or  swelling. You have yellow or green drainage coming from your eye. You have new symptoms. Get help right away if: You develop severe pain. Your vision gets much worse. Summary Viral conjunctivitis is an inflammation of the conjunctiva. It usually goes away in 1-2 weeks. The condition is caused by a virus and is spread by touching contaminated objects or breathing in droplets from a cough or a sneeze. This condition is usually treated with medicines and cold compresses to relieve the symptoms. Because it is caused by a virus, it should not be treated with antibiotics. This condition is very contagious. To prevent infection, avoid close contact with others, wash your hands often, and do not share towels or washcloths. Contact a health care provider if your symptoms do not go away with treatment, or if you have blurry vision, facial swelling, or increased pain. This information is not intended to replace advice given to you by your health care provider. Make sure you discuss any questions you have with your health care provider. Document Revised: 03/28/2021 Document Reviewed: 03/28/2021 Elsevier Patient Education  2023 [employer]  

## 2022-07-15 NOTE — Progress Notes (Signed)
[facility] [address] [place] [address] Dept: [phone] Dept Fax: [phone]  Office Visit  Subjective:    Patient ID: Kimberly [name], female    DOB: 1951/01/10, 72 y.o..   MRN: [id number]  Chief Complaint  Patient presents with   Eye Problem    C/o having LT red swollen eye x day.     History of Present Illness:  Patient is in today for evaluation of swelling of the left eye. Kimberly [name] was seen on Friday with an acute illness involving hoarseness and profound fatigue. She tested positive for COVID. She has been taking Paxlovid. Her symptoms have not improved significantly since then. She notes she is not eating very much. Over the weekend, she developed swelling of the left eye. It has had some tearing. She denies any eye pain or decreased vision.  Past Medical History: Patient Active Problem List   Diagnosis Date Noted   COVID-19 07/12/2022   Incontinence of feces with fecal urgency 03/23/2021   SUI (stress urinary incontinence, female) 03/23/2021   Overweight (BMI 25.0-29.9) 10/12/2020   Hiatal hernia 09/22/2020   GERD (gastroesophageal reflux disease) 09/22/2020   Diverticulosis 09/22/2020   Osteopenia 09/08/2020   Restless leg syndrome 06/05/2020   Hypothyroidism 03/29/2020   Essential hypertension 03/29/2020   Kidney stones 03/29/2020   Asthma 03/29/2020   Anxiety disorder 03/29/2020   Past Surgical History:  Procedure Laterality Date   ANTERIOR AND POSTERIOR VAGINAL REPAIR W/ SACROSPINOUS LIGAMENT SUSPENSION  06/29/2021   PARTIAL HYSTERECTOMY  1980   Pre-cancerous condition. Failed laser surgery. still have ovaries   UMBILICAL HERNIA REPAIR     with mesh   WRIST SURGERY Left 2019   Family History  Problem Relation Age of Onset   Diabetes Mother    Breast cancer Mother    Colon cancer Father    Kidney failure Father    COPD Brother    Diabetes Brother    Heart disease Brother    Heart  disease Maternal Uncle    Heart disease Daughter    Depression Son    Esophageal cancer Neg Hx    Stomach cancer Neg Hx    Rectal cancer Neg Hx    Outpatient Medications Prior to Visit  Medication Sig Dispense Refill   albuterol (VENTOLIN HFA) 108 (90 Base) MCG/ACT inhaler INHALE 2 PUFFS BY MOUTH EVERY 4 HOURS AS NEEDED 8.5 g 3   aspirin 81 MG chewable tablet Chew 81 mg by mouth daily.     Biotin 3 MG TABS Take by mouth.     buPROPion (WELLBUTRIN SR) 150 MG 12 hr tablet TAKE 1 TABLET(150 MG) BY MOUTH TWICE DAILY 180 tablet 3   chlorpheniramine-HYDROcodone (TUSSIONEX) 10-8 MG/5ML Take 5 mLs by mouth every 12 (twelve) hours as needed for cough. 70 mL 0   cholecalciferol (VITAMIN D3) 25 MCG (1000 UNIT) tablet      Cranberry 1000 MG CAPS Take by mouth.     diazepam (VALIUM) 5 MG tablet TAKE 1/2 TO 1 TABLET(2.5 TO 5 MG) BY MOUTH IN THE MORNING AND AT BEDTIME. MAY TAKE AN ADDITIONAL DOSE DAILY AS NEEDED 60 tablet 1   levothyroxine (SYNTHROID) 100 MCG tablet TAKE 1 TABLET(100 MCG) BY MOUTH DAILY 90 tablet 3   metoprolol succinate (TOPROL-XL) 25 MG 24 hr tablet Take 1 tablet (25 mg total) by mouth daily. 90 tablet 3   nirmatrelvir/ritonavir (PAXLOVID) 20 x 150 MG & 10 x 100MG  TABS Take 3 tablets  by mouth 2 (two) times daily for 5 days. (Take nirmatrelvir 150 mg two tablets twice daily for 5 days and ritonavir 100 mg one tablet twice daily for 5 days) Patient GFR is 79 30 tablet 0   ondansetron (ZOFRAN) 4 MG tablet Take 1 tablet (4 mg total) by mouth every 8 (eight) hours as needed for nausea or vomiting. 20 tablet 0   rizatriptan (MAXALT) 5 MG tablet Take 1 tablet (5 mg total) by mouth as needed for migraine. May repeat in 2 hours if needed 10 tablet 1   rOPINIRole (REQUIP) 0.5 MG tablet Take 1 tablet (0.5 mg total) by mouth at bedtime. (Patient taking differently: Take 0.5 mg by mouth at bedtime. Takes as needed) 30 tablet 5   No facility-administered medications prior to visit.   Allergies   Allergen Reactions   Codeine Hives   Sulfa Antibiotics Hives and Itching     Objective:   Today's Vitals   07/15/22 1420  Pulse: 82  Temp: 97.9 F (36.6 C)  TempSrc: Temporal  SpO2: 95%  Weight: 141 lb 9.6 oz (64.2 kg)  Height: 5' 1.5" (1.562 m)   Body mass index is 26.32 kg/m.   General: Moderately ill appearing. No acute distress. HEENT: Normocephalic, non-traumatic. PERRL, EOMI. The left eye shows puffy swelling of the lids,   with marked injection of the sclera bilaterally. Nose clear without congestion or rhinorrhea. Mucous   membranes moist. Oropharynx clear. Good dentition. Neck: Supple. No lymphadenopathy. No thyromegaly. Lungs: Clear to auscultation bilaterally. No wheezing, rales or rhonchi. CV: RRR without murmurs or rubs. Pulses 2+ bilaterally. Psych: Alert and oriented. Normal mood and affect.  Health Maintenance Due  Topic Date Due   Hepatitis C Screening  Never done   Zoster Vaccines- Shingrix (1 of 2) Never done     Assessment & Plan:   Problem List Items Addressed This Visit       Other   COVID-19    Complete course of Paxlovid. I recommend she push fluids and get adequate rest.      Other Visit Diagnoses     Viral conjunctivitis of left eye    -  Primary   The eye symptoms appear to be related to viral conjuctivitis/episcleritis. I recommend cool compresses and expectant management.       Return if symptoms worsen or fail to improve.   Loyola Mast, MD

## 2022-07-15 NOTE — Assessment & Plan Note (Signed)
Complete course of Paxlovid. I recommend she push fluids and get adequate rest.

## 2022-07-15 NOTE — Telephone Encounter (Signed)
Appointment scheduled for today @ 2:20 pm. Dm/cma

## 2022-07-19 ENCOUNTER — Ambulatory Visit (INDEPENDENT_AMBULATORY_CARE_PROVIDER_SITE_OTHER): Payer: PPO | Admitting: Family

## 2022-07-19 ENCOUNTER — Encounter (HOSPITAL_COMMUNITY): Payer: Self-pay | Admitting: Internal Medicine

## 2022-07-19 ENCOUNTER — Encounter: Payer: Self-pay | Admitting: Family

## 2022-07-19 ENCOUNTER — Inpatient Hospital Stay (HOSPITAL_COMMUNITY)
Admission: EM | Admit: 2022-07-19 | Discharge: 2022-07-22 | DRG: 189 | Disposition: A | Discharge status: Home / Self Care | Payer: PPO | Attending: Internal Medicine | Admitting: Internal Medicine

## 2022-07-19 ENCOUNTER — Other Ambulatory Visit: Payer: Self-pay

## 2022-07-19 ENCOUNTER — Emergency Department (HOSPITAL_COMMUNITY): Payer: PPO

## 2022-07-19 VITALS — BP 122/84 | HR 115 | Resp 22 | Ht 61.5 in | Wt 138.0 lb

## 2022-07-19 DIAGNOSIS — G2581 Restless legs syndrome: Secondary | ICD-10-CM | POA: Diagnosis present

## 2022-07-19 DIAGNOSIS — Z86010 Personal history of colonic polyps: Secondary | ICD-10-CM

## 2022-07-19 DIAGNOSIS — Z87442 Personal history of urinary calculi: Secondary | ICD-10-CM

## 2022-07-19 DIAGNOSIS — R0603 Acute respiratory distress: Secondary | ICD-10-CM

## 2022-07-19 DIAGNOSIS — Z79899 Other long term (current) drug therapy: Secondary | ICD-10-CM

## 2022-07-19 DIAGNOSIS — R630 Anorexia: Secondary | ICD-10-CM | POA: Diagnosis present

## 2022-07-19 DIAGNOSIS — U071 COVID-19: Secondary | ICD-10-CM | POA: Diagnosis not present

## 2022-07-19 DIAGNOSIS — Z6826 Body mass index (BMI) 26.0-26.9, adult: Secondary | ICD-10-CM

## 2022-07-19 DIAGNOSIS — Z825 Family history of asthma and other chronic lower respiratory diseases: Secondary | ICD-10-CM

## 2022-07-19 DIAGNOSIS — J9601 Acute respiratory failure with hypoxia: Principal | ICD-10-CM | POA: Diagnosis present

## 2022-07-19 DIAGNOSIS — U099 Post covid-19 condition, unspecified: Secondary | ICD-10-CM | POA: Diagnosis present

## 2022-07-19 DIAGNOSIS — Z8249 Family history of ischemic heart disease and other diseases of the circulatory system: Secondary | ICD-10-CM

## 2022-07-19 DIAGNOSIS — Z885 Allergy status to narcotic agent status: Secondary | ICD-10-CM

## 2022-07-19 DIAGNOSIS — E663 Overweight: Secondary | ICD-10-CM | POA: Diagnosis present

## 2022-07-19 DIAGNOSIS — Z8542 Personal history of malignant neoplasm of other parts of uterus: Secondary | ICD-10-CM

## 2022-07-19 DIAGNOSIS — R000 Tachycardia, unspecified: Secondary | ICD-10-CM

## 2022-07-19 DIAGNOSIS — Z90711 Acquired absence of uterus with remaining cervical stump: Secondary | ICD-10-CM

## 2022-07-19 DIAGNOSIS — F419 Anxiety disorder, unspecified: Secondary | ICD-10-CM | POA: Diagnosis present

## 2022-07-19 DIAGNOSIS — Z87891 Personal history of nicotine dependence: Secondary | ICD-10-CM

## 2022-07-19 DIAGNOSIS — J45901 Unspecified asthma with (acute) exacerbation: Secondary | ICD-10-CM | POA: Diagnosis present

## 2022-07-19 DIAGNOSIS — E876 Hypokalemia: Secondary | ICD-10-CM

## 2022-07-19 DIAGNOSIS — Z79890 Hormone replacement therapy (postmenopausal): Secondary | ICD-10-CM

## 2022-07-19 DIAGNOSIS — F32A Depression, unspecified: Secondary | ICD-10-CM | POA: Diagnosis present

## 2022-07-19 DIAGNOSIS — Z800 Family history of malignant neoplasm of digestive organs: Secondary | ICD-10-CM

## 2022-07-19 DIAGNOSIS — E039 Hypothyroidism, unspecified: Secondary | ICD-10-CM | POA: Diagnosis present

## 2022-07-19 DIAGNOSIS — Z7982 Long term (current) use of aspirin: Secondary | ICD-10-CM

## 2022-07-19 DIAGNOSIS — Z882 Allergy status to sulfonamides status: Secondary | ICD-10-CM

## 2022-07-19 DIAGNOSIS — I10 Essential (primary) hypertension: Secondary | ICD-10-CM | POA: Diagnosis present

## 2022-07-19 DIAGNOSIS — R0602 Shortness of breath: Secondary | ICD-10-CM | POA: Diagnosis not present

## 2022-07-19 DIAGNOSIS — K2270 Barrett's esophagus without dysplasia: Secondary | ICD-10-CM | POA: Diagnosis present

## 2022-07-19 DIAGNOSIS — K219 Gastro-esophageal reflux disease without esophagitis: Secondary | ICD-10-CM | POA: Diagnosis present

## 2022-07-19 DIAGNOSIS — Z8701 Personal history of pneumonia (recurrent): Secondary | ICD-10-CM

## 2022-07-19 DIAGNOSIS — R519 Headache, unspecified: Secondary | ICD-10-CM | POA: Diagnosis present

## 2022-07-19 LAB — BASIC METABOLIC PANEL
Anion gap: 14 (ref 5–15)
BUN: 16 mg/dL (ref 8–23)
CO2: 31 mmol/L (ref 22–32)
Calcium: 9.2 mg/dL (ref 8.9–10.3)
Chloride: 89 mmol/L — ABNORMAL LOW (ref 98–111)
Creatinine, Ser: 0.87 mg/dL (ref 0.44–1.00)
GFR, Estimated: >60 mL/min (ref >60)
Glucose, Bld: 133 mg/dL — ABNORMAL HIGH (ref 70–99)
Potassium: 3.2 mmol/L — ABNORMAL LOW (ref 3.5–5.1)
Sodium: 134 mmol/L — ABNORMAL LOW (ref 135–145)

## 2022-07-19 LAB — CBC WITH DIFFERENTIAL/PLATELET
Abs Immature Granulocytes: 0.7 10*3/uL — ABNORMAL HIGH (ref 0.00–0.07)
Basophils Absolute: 0 10*3/uL (ref 0.0–0.1)
Basophils Relative: 0 %
Eosinophils Absolute: 0.1 10*3/uL (ref 0.0–0.5)
Eosinophils Relative: 1 %
HCT: 40.2 % (ref 36.0–46.0)
Hemoglobin: 13.4 g/dL (ref 12.0–15.0)
Immature Granulocytes: 6 %
Lymphocytes Relative: 5 %
Lymphs Abs: 0.6 10*3/uL — ABNORMAL LOW (ref 0.7–4.0)
MCH: 28.2 pg (ref 26.0–34.0)
MCHC: 33.3 g/dL (ref 30.0–36.0)
MCV: 84.6 fL (ref 80.0–100.0)
Monocytes Absolute: 0.8 10*3/uL (ref 0.1–1.0)
Monocytes Relative: 7 %
Neutro Abs: 10.1 10*3/uL — ABNORMAL HIGH (ref 1.7–7.7)
Neutrophils Relative %: 81 %
Platelets: 259 10*3/uL (ref 150–400)
RBC: 4.75 MIL/uL (ref 3.87–5.11)
RDW: 13 % (ref 11.5–15.5)
WBC: 12.5 10*3/uL — ABNORMAL HIGH (ref 4.0–10.5)
nRBC: 0 % (ref 0.0–0.2)

## 2022-07-19 LAB — TROPONIN I (HIGH SENSITIVITY)
Troponin I (High Sensitivity): 11 ng/L (ref <18)
Troponin I (High Sensitivity): 10 ng/L (ref <18)

## 2022-07-19 LAB — BRAIN NATRIURETIC PEPTIDE: B Natriuretic Peptide: 85.6 pg/mL (ref 0.0–100.0)

## 2022-07-19 MED ORDER — POLYVINYL ALCOHOL 1.4 % OP SOLN
1.0000 [drp] | OPHTHALMIC | Status: DC | PRN
  Administered 2022-07-19: 1 [drp] via OPHTHALMIC
  Filled 2022-07-19: qty 15

## 2022-07-19 MED ORDER — DEXAMETHASONE SODIUM PHOSPHATE 10 MG/ML IJ SOLN
10.0000 mg | Freq: Once | INTRAMUSCULAR | Status: AC
  Administered 2022-07-19: 10 mg
  Filled 2022-07-19: qty 1

## 2022-07-19 MED ORDER — ONDANSETRON HCL 4 MG/2ML IJ SOLN: 4.0000 mg | Freq: Four times a day (QID) | INTRAMUSCULAR | Status: DC | PRN

## 2022-07-19 MED ORDER — ALBUTEROL SULFATE (2.5 MG/3ML) 0.083% IN NEBU: 2.5000 mg | INHALATION_SOLUTION | RESPIRATORY_TRACT | Status: DC | PRN

## 2022-07-19 MED ORDER — MAGNESIUM SULFATE 2 GM/50ML IV SOLN
2.0000 g | Freq: Once | INTRAVENOUS | Status: AC
  Administered 2022-07-19: 2 g via INTRAVENOUS
  Filled 2022-07-19: qty 50

## 2022-07-19 MED ORDER — LEVOTHYROXINE SODIUM 100 MCG PO TABS
100.0000 ug | ORAL_TABLET | Freq: Every day | ORAL | Status: DC
  Administered 2022-07-20 – 2022-07-21 (×2): 100 ug via ORAL
  Filled 2022-07-19 (×2): qty 1

## 2022-07-19 MED ORDER — GUAIFENESIN 100 MG/5ML PO LIQD
5.0000 mL | ORAL | Status: DC | PRN
  Administered 2022-07-19 – 2022-07-22 (×8): 5 mL via ORAL
  Filled 2022-07-19 (×8): qty 10

## 2022-07-19 MED ORDER — ENOXAPARIN SODIUM 40 MG/0.4ML IJ SOSY
40.0000 mg | PREFILLED_SYRINGE | INTRAMUSCULAR | Status: DC
  Administered 2022-07-19 – 2022-07-21 (×3): 40 mg via SUBCUTANEOUS
  Filled 2022-07-19 (×3): qty 0.4

## 2022-07-19 MED ORDER — ASPIRIN 81 MG PO CHEW
81.0000 mg | CHEWABLE_TABLET | Freq: Every day | ORAL | Status: DC
  Administered 2022-07-20 – 2022-07-22 (×3): 81 mg via ORAL
  Filled 2022-07-19 (×3): qty 1

## 2022-07-19 MED ORDER — METOPROLOL SUCCINATE ER 25 MG PO TB24
25.0000 mg | ORAL_TABLET | Freq: Every day | ORAL | Status: DC
  Administered 2022-07-19: 25 mg via ORAL
  Filled 2022-07-19 (×2): qty 1

## 2022-07-19 MED ORDER — IPRATROPIUM-ALBUTEROL 0.5-2.5 (3) MG/3ML IN SOLN: 3.0000 mL | Freq: Four times a day (QID) | RESPIRATORY_TRACT | Status: DC

## 2022-07-19 MED ORDER — IPRATROPIUM-ALBUTEROL 0.5-2.5 (3) MG/3ML IN SOLN
3.0000 mL | Freq: Four times a day (QID) | RESPIRATORY_TRACT | Status: DC
  Administered 2022-07-19 – 2022-07-20 (×2): 3 mL via RESPIRATORY_TRACT
  Filled 2022-07-19 (×2): qty 3

## 2022-07-19 MED ORDER — DIAZEPAM 5 MG PO TABS
2.5000 mg | ORAL_TABLET | Freq: Two times a day (BID) | ORAL | Status: DC | PRN
  Administered 2022-07-21: 5 mg via ORAL
  Filled 2022-07-19: qty 1

## 2022-07-19 MED ORDER — DEXAMETHASONE 4 MG PO TABS
6.0000 mg | ORAL_TABLET | ORAL | Status: DC
  Administered 2022-07-20 – 2022-07-22 (×3): 6 mg via ORAL
  Filled 2022-07-19 (×3): qty 1

## 2022-07-19 MED ORDER — POTASSIUM CHLORIDE CRYS ER 20 MEQ PO TBCR
40.0000 meq | EXTENDED_RELEASE_TABLET | Freq: Once | ORAL | Status: AC
  Administered 2022-07-19: 40 meq via ORAL
  Filled 2022-07-19: qty 2

## 2022-07-19 MED ORDER — HYDROCOD POLI-CHLORPHE POLI ER 10-8 MG/5ML PO SUER
5.0000 mL | Freq: Two times a day (BID) | ORAL | Status: DC | PRN
  Administered 2022-07-19 – 2022-07-22 (×6): 5 mL via ORAL
  Filled 2022-07-19 (×6): qty 5

## 2022-07-19 MED ORDER — BUPROPION HCL ER (SR) 150 MG PO TB12
150.0000 mg | ORAL_TABLET | Freq: Two times a day (BID) | ORAL | Status: DC
  Administered 2022-07-19 – 2022-07-22 (×6): 150 mg via ORAL
  Filled 2022-07-19 (×6): qty 1

## 2022-07-19 MED ORDER — ACETAMINOPHEN 325 MG PO TABS: 650.0000 mg | ORAL_TABLET | Freq: Four times a day (QID) | ORAL | Status: DC | PRN

## 2022-07-19 MED ORDER — IPRATROPIUM-ALBUTEROL 0.5-2.5 (3) MG/3ML IN SOLN
3.0000 mL | Freq: Once | RESPIRATORY_TRACT | Status: AC
  Administered 2022-07-19: 3 mL via RESPIRATORY_TRACT
  Filled 2022-07-19: qty 3

## 2022-07-19 MED ORDER — ONDANSETRON HCL 4 MG PO TABS: 4.0000 mg | ORAL_TABLET | Freq: Four times a day (QID) | ORAL | Status: DC | PRN

## 2022-07-19 MED ORDER — ACETAMINOPHEN 650 MG RE SUPP: 650.0000 mg | Freq: Four times a day (QID) | RECTAL | Status: DC | PRN

## 2022-07-19 NOTE — Progress Notes (Signed)
  Transition of Care [facility]) Screening Note   Patient Details  Name: Kimberly [name] Date of Birth: 1951-02-04   Transition of Care [facility]) CM/SW Contact:    Adrian Prows, RN Phone Number: 07/19/2022, 3:19 PM    Transition of Care Department [facility]) has reviewed patient and no TOC needs have been identified at this time. We will continue to monitor patient advancement through interdisciplinary progression rounds. If new patient transition needs arise, please place a TOC consult.

## 2022-07-19 NOTE — ED Triage Notes (Signed)
Pt BIBA from [facility] c/o  SOB, productive cough, "here and there" fevers.  Covid positive may 10, viral conjunctivitis may 13. Bronchi in lower lobes.  98% on 2L [place] in route.   BP 132/88 90% RA HR 88 CBG 149

## 2022-07-19 NOTE — H&P (Signed)
History and Physical    Patient: Kimberly [name] ZOX:[id number] DOB: 1950-09-30 DOA: 07/19/2022 DOS: the patient was seen and examined on 07/19/2022 PCP: Loyola Mast, MD  Patient coming from: Home  Chief Complaint:  Chief Complaint  Patient presents with   Shortness of Breath   HPI: Kimberly [name] is a 72 y.o. female with medical history significant of anxiety, depression, asthma, Barrett's esophagus, chronic headaches, colon polyps, GERD, hypertension, nephrolithiasis, thyroid disease, uterine cancer who was recently diagnosed with COVID-19 and was treated with Paxlovid but comes to the ED due to dyspnea, wheezing, productive cough and low-grade temperatures.  4 days ago she developed conjunctivitis which he thinks may have been due to Paxlovid, although it could also be viral.  She initially had rhinorrhea, sore throat & was pressured.  Denied hemoptysis.Marland Kitchen  No chest pain, palpitations, diaphoresis, PND, orthopnea or pitting edema of the lower extremities.  No abdominal pain, nausea, emesis, diarrhea, constipation, melena or hematochezia.  No flank pain, dysuria, frequency or hematuria.  No polyuria, polydipsia, polyphagia or blurred vision.   Lab work: CBC showed a white count of 12.5 with 81% neutrophils, hemoglobin 13.4 g/dL platelets 811.  Troponin x 2 and BNP were normal.  BMP showed a sodium of 134, potassium 3.2, chloride 89 and CO2 31 mmol/L with a normal anion gap.  Glucose 133 mg/dL.  Normal calcium and renal function.  Imaging: Portable 1 view chest radiograph with normal heart size and mediastinal contours.  Both lungs were clear.  No active disease.   ED course: Initial vital signs were temperature 98.5 F, pulse 98, respirations 23, BP 153/91 mmHg O2 sat 100% on room air.  The patient received dexamethasone 10 mg IVP x 1, a DuoNeb and I added magnesium sulfate 2 g IVPB plus KCl 40 mEq p.o. x 1.  Review of Systems: As mentioned in the history of present illness. All other systems  reviewed and are negative. Past Medical History:  Diagnosis Date   Anxiety    Asthma    Barrett esophagus    Chronic headaches    Colon polyps    COVID 03/2020   Depression    GERD (gastroesophageal reflux disease)    Hypertension    Kidney stones    Pneumonia    Thyroid disease    Uterine cancer [facility])    Past Surgical History:  Procedure Laterality Date   ANTERIOR AND POSTERIOR VAGINAL REPAIR W/ SACROSPINOUS LIGAMENT SUSPENSION  06/29/2021   PARTIAL HYSTERECTOMY  1980   Pre-cancerous condition. Failed laser surgery. still have ovaries   UMBILICAL HERNIA REPAIR     with mesh   WRIST SURGERY Left 2019   Social History:  reports that she quit smoking about 29 years ago. Her smoking use included cigarettes. She has never used smokeless tobacco. She reports current alcohol use of about 3.0 standard drinks of alcohol per week. She reports that she does not use drugs.  Allergies  Allergen Reactions   Codeine Hives   Sulfa Antibiotics Hives and Itching    Family History  Problem Relation Age of Onset   Diabetes Mother    Breast cancer Mother    Colon cancer Father    Kidney failure Father    COPD Brother    Diabetes Brother    Heart disease Brother    Heart disease Maternal Uncle    Heart disease Daughter    Depression Son    Esophageal cancer Neg Hx    Stomach cancer Neg Hx  Rectal cancer Neg Hx     Prior to Admission medications   Medication Sig Start Date End Date Taking? Authorizing Provider  albuterol (VENTOLIN HFA) 108 (90 Base) MCG/ACT inhaler INHALE 2 PUFFS BY MOUTH EVERY 4 HOURS AS NEEDED 08/21/21   Loyola Mast, MD  aspirin 81 MG chewable tablet Chew 81 mg by mouth daily.    [provider]  Biotin 3 MG TABS Take by mouth.    [provider]  buPROPion (WELLBUTRIN SR) 150 MG 12 hr tablet TAKE 1 TABLET(150 MG) BY MOUTH TWICE DAILY 05/29/22   Loyola Mast, MD  chlorpheniramine-HYDROcodone (TUSSIONEX) 10-8 MG/5ML Take 5 mLs by mouth  every 12 (twelve) hours as needed for cough. 07/12/22   McElwee, Jake Church, NP  cholecalciferol (VITAMIN D3) 25 MCG (1000 UNIT) tablet     [provider]  Cranberry 1000 MG CAPS Take by mouth.    [provider]  diazepam (VALIUM) 5 MG tablet TAKE 1/2 TO 1 TABLET(2.5 TO 5 MG) BY MOUTH IN THE MORNING AND AT BEDTIME. MAY TAKE AN ADDITIONAL DOSE DAILY AS NEEDED 05/22/22   Loyola Mast, MD  levothyroxine (SYNTHROID) 100 MCG tablet TAKE 1 TABLET(100 MCG) BY MOUTH DAILY 12/17/21   Mliss Sax, MD  metoprolol succinate (TOPROL-XL) 25 MG 24 hr tablet Take 1 tablet (25 mg total) by mouth daily. 11/13/21   Loyola Mast, MD  ondansetron (ZOFRAN) 4 MG tablet Take 1 tablet (4 mg total) by mouth every 8 (eight) hours as needed for nausea or vomiting. 06/25/22   Loyola Mast, MD  rizatriptan (MAXALT) 5 MG tablet Take 1 tablet (5 mg total) by mouth as needed for migraine. May repeat in 2 hours if needed 11/26/21   Loyola Mast, MD  rOPINIRole (REQUIP) 0.5 MG tablet Take 1 tablet (0.5 mg total) by mouth at bedtime. Patient taking differently: Take 0.5 mg by mouth at bedtime. Takes as needed 01/12/21   Loyola Mast, MD  famotidine 20 MG/2ML SOLN   06/14/20  [provider]    Physical Exam: Vitals:   07/19/22 1200 07/19/22 1215 07/19/22 1230 07/19/22 1238  BP: 125/69  (!) 143/75   Pulse: 88  92   Resp: (!) 22  (!) 28   Temp:      TempSrc:      SpO2: 99% 96% 100% (!) 89%  Weight:      Height:       Physical Exam Vitals reviewed.  Constitutional:      General: She is awake. She is in acute distress.  HENT:     Head: Normocephalic.     Nose: No rhinorrhea.     Mouth/Throat:     Mouth: Mucous membranes are moist.  Eyes:     General: No scleral icterus.    Pupils: Pupils are equal, round, and reactive to light.  Neck:     Vascular: No JVD.  Cardiovascular:     Rate and Rhythm: Normal rate and regular rhythm.     Heart sounds: S1 normal and S2 normal.   Pulmonary:     Effort: Pulmonary effort is normal.     Breath sounds: Decreased breath sounds, wheezing and rhonchi present. No rales.  Abdominal:     General: Bowel sounds are normal.     Palpations: Abdomen is soft.     Tenderness: There is no abdominal tenderness.  Musculoskeletal:     Cervical back: Neck supple.     Right  lower leg: No edema.     Left lower leg: No edema.  Skin:    General: Skin is warm and dry.  Neurological:     General: No focal deficit present.     Mental Status: She is alert and oriented to person, place, and time.  Psychiatric:        Mood and Affect: Mood normal.        Behavior: Behavior normal. Behavior is cooperative.    Data Reviewed:  There are no new results to review at this time.  Assessment and Plan: Principal Problem:   Acute respiratory failure with hypoxia (HCC) In the setting of:   COVID-19 virus infection induced   Asthma exacerbation Admit to PCU/inpatient. Supplemental oxygen as needed. Bronchodilators as needed. Incentive spirometry while awake. Flutter valve exercises. Antitussives as needed. Vitamin C/zinc supplementation Dexamethasone 6 mg IVP daily. Already finished Paxlovid course. Follow-up CBC and CMP.  Active Problems:   Hypothyroidism Continue levothyroxine 100 mcg p.o. daily.    Essential hypertension Continue metoprolol succinate 25 mg p.o. daily at bedtime.    Restless leg syndrome No longer taking Requip.    GERD (gastroesophageal reflux disease) Antiacid, H2 blocker or PPI as needed.    Overweight (BMI 25.0-29.9) Lifestyle modifications.     Advance Care Planning:   Code Status: Full Code   Consults:   Family Communication:   Severity of Illness: The appropriate patient status for this patient is OBSERVATION. Observation status is judged to be reasonable and necessary in order to provide the required intensity of service to ensure the patient's safety. The patient's presenting symptoms,  physical exam findings, and initial radiographic and laboratory data in the context of their medical condition is felt to place them at decreased risk for further clinical deterioration. Furthermore, it is anticipated that the patient will be medically stable for discharge from the hospital within 2 midnights of admission.   Author: Bobette Mo, MD 07/19/2022 1:09 PM  For on call review [email or web address].   This document was prepared using Dragon voice recognition software and may contain some unintended transcription errors.

## 2022-07-19 NOTE — ED Provider Notes (Signed)
[place] EMERGENCY DEPARTMENT AT [facility] Provider Note  CSN: [id number] Arrival date & time: 07/19/22 7829  Chief Complaint(s) Shortness of Breath  HPI Kimberly [name] is a 72 y.o. female with past medical history as below, significant for asthma, Barrett's esophagus, hypertension, GERD, hiatal hernia, hypothyroid who presents to the ED with complaint of covid 19, difficulty, sent from PCP.  Patient reports she is diagnosed COVID-19 approximate 1 week ago, she is having progressively worsening symptoms.  She completed Paxlovid but unfortunately had adverse effects causing eye irritation.  She went to her PCP for recheck today and was noted to have hypoxia, she is having worsening cough and difficulty breathing over the past few days, productive cough with yellow/white phlegm.  Poor appetite, no change in bowel or bladder function, abdominal pain nausea or vomiting.  No significant chest pain.  History of asthma, no formal COPD diagnosis, does not daily inhaler  Past Medical History Past Medical History:  Diagnosis Date   Anxiety    Asthma    Barrett esophagus    Chronic headaches    Colon polyps    COVID 03/2020   Depression    GERD (gastroesophageal reflux disease)    Hypertension    Kidney stones    Pneumonia    Thyroid disease    Uterine cancer [facility])    Patient Active Problem List   Diagnosis Date Noted   COVID-19 07/12/2022   Incontinence of feces with fecal urgency 03/23/2021   SUI (stress urinary incontinence, female) 03/23/2021   Overweight (BMI 25.0-29.9) 10/12/2020   Hiatal hernia 09/22/2020   GERD (gastroesophageal reflux disease) 09/22/2020   Diverticulosis 09/22/2020   Osteopenia 09/08/2020   Restless leg syndrome 06/05/2020   Hypothyroidism 03/29/2020   Essential hypertension 03/29/2020   Kidney stones 03/29/2020   Asthma 03/29/2020   Anxiety disorder 03/29/2020   Home Medication(s) Prior to Admission medications   Medication Sig Start Date  End Date Taking? Authorizing Provider  albuterol (VENTOLIN HFA) 108 (90 Base) MCG/ACT inhaler INHALE 2 PUFFS BY MOUTH EVERY 4 HOURS AS NEEDED 08/21/21   Loyola Mast, MD  aspirin 81 MG chewable tablet Chew 81 mg by mouth daily.    [provider]  Biotin 3 MG TABS Take by mouth.    [provider]  buPROPion (WELLBUTRIN SR) 150 MG 12 hr tablet TAKE 1 TABLET(150 MG) BY MOUTH TWICE DAILY 05/29/22   Loyola Mast, MD  chlorpheniramine-HYDROcodone (TUSSIONEX) 10-8 MG/5ML Take 5 mLs by mouth every 12 (twelve) hours as needed for cough. 07/12/22   McElwee, Jake Church, NP  cholecalciferol (VITAMIN D3) 25 MCG (1000 UNIT) tablet     [provider]  Cranberry 1000 MG CAPS Take by mouth.    [provider]  diazepam (VALIUM) 5 MG tablet TAKE 1/2 TO 1 TABLET(2.5 TO 5 MG) BY MOUTH IN THE MORNING AND AT BEDTIME. MAY TAKE AN ADDITIONAL DOSE DAILY AS NEEDED 05/22/22   Loyola Mast, MD  levothyroxine (SYNTHROID) 100 MCG tablet TAKE 1 TABLET(100 MCG) BY MOUTH DAILY 12/17/21   Mliss Sax, MD  metoprolol succinate (TOPROL-XL) 25 MG 24 hr tablet Take 1 tablet (25 mg total) by mouth daily. 11/13/21   Loyola Mast, MD  ondansetron (ZOFRAN) 4 MG tablet Take 1 tablet (4 mg total) by mouth every 8 (eight) hours as needed for nausea or vomiting. 06/25/22   Loyola Mast, MD  rizatriptan (MAXALT) 5 MG tablet Take 1 tablet (5 mg total) by  mouth as needed for migraine. May repeat in 2 hours if needed 11/26/21   Loyola Mast, MD  rOPINIRole (REQUIP) 0.5 MG tablet Take 1 tablet (0.5 mg total) by mouth at bedtime. Patient taking differently: Take 0.5 mg by mouth at bedtime. Takes as needed 01/12/21   Loyola Mast, MD  famotidine 20 MG/2ML SOLN   06/14/20  [provider]                                                                                                                                    Past Surgical History Past Surgical History:  Procedure  Laterality Date   ANTERIOR AND POSTERIOR VAGINAL REPAIR W/ SACROSPINOUS LIGAMENT SUSPENSION  06/29/2021   PARTIAL HYSTERECTOMY  1980   Pre-cancerous condition. Failed laser surgery. still have ovaries   UMBILICAL HERNIA REPAIR     with mesh   WRIST SURGERY Left 2019   Family History Family History  Problem Relation Age of Onset   Diabetes Mother    Breast cancer Mother    Colon cancer Father    Kidney failure Father    COPD Brother    Diabetes Brother    Heart disease Brother    Heart disease Maternal Uncle    Heart disease Daughter    Depression Son    Esophageal cancer Neg Hx    Stomach cancer Neg Hx    Rectal cancer Neg Hx     Social History Social History   Tobacco Use   Smoking status: Former    Types: Cigarettes    Quit date: 1995    Years since quitting: 29.3   Smokeless tobacco: Never  Vaping Use   Vaping Use: Never used  Substance Use Topics   Alcohol use: Yes    Alcohol/week: 3.0 standard drinks of alcohol    Types: 3 Standard drinks or equivalent per week    Comment: 1 per day   Drug use: Never   Allergies Codeine and Sulfa antibiotics  Review of Systems Review of Systems  Constitutional:  Positive for fatigue. Negative for activity change and fever.  HENT:  Negative for facial swelling and trouble swallowing.   Eyes:  Negative for discharge and redness.  Respiratory:  Positive for cough and shortness of breath.   Cardiovascular:  Negative for chest pain and palpitations.  Gastrointestinal:  Negative for abdominal pain and nausea.  Genitourinary:  Negative for dysuria and flank pain.  Musculoskeletal:  Negative for back pain and gait problem.  Skin:  Negative for pallor and rash.  Neurological:  Negative for syncope and headaches.    Physical Exam Vital Signs  I have reviewed the triage vital signs BP (!) 143/75   Pulse 92   Temp 98.5 F (36.9 C) (Oral)   Resp (!) 28   Ht 5\' 1"  (1.549 m)   Wt 62.6 kg   SpO2 (!) 89%   BMI 26.07 kg/m  Physical Exam Vitals and nursing note reviewed.  Constitutional:      General: She is not in acute distress.    Appearance: Normal appearance. She is well-developed.  HENT:     Head: Normocephalic and atraumatic.     Right Ear: External ear normal.     Left Ear: External ear normal.     Nose: Nose normal.     Mouth/Throat:     Mouth: Mucous membranes are moist.  Eyes:     General: No scleral icterus.       Right eye: No discharge.        Left eye: No discharge.     Conjunctiva/sclera:     Right eye: Right conjunctiva is injected.     Left eye: Left conjunctiva is injected.  Cardiovascular:     Rate and Rhythm: Normal rate and regular rhythm.     Pulses: Normal pulses.     Heart sounds: Normal heart sounds.  Pulmonary:     Effort: Pulmonary effort is normal. Tachypnea present. No respiratory distress.     Breath sounds: Decreased breath sounds present.  Abdominal:     General: Abdomen is flat.     Palpations: Abdomen is soft.     Tenderness: There is no abdominal tenderness.  Musculoskeletal:        General: Normal range of motion.     Right lower leg: No edema.     Left lower leg: No edema.  Skin:    General: Skin is warm and dry.     Capillary Refill: Capillary refill takes less than 2 seconds.  Neurological:     Mental Status: She is alert.  Psychiatric:        Mood and Affect: Mood normal.        Behavior: Behavior normal.     ED Results and Treatments Labs (all labs ordered are listed, but only abnormal results are displayed) Labs Reviewed  BASIC METABOLIC PANEL - Abnormal; Notable for the following components:      Result Value   Sodium 134 (*)    Potassium 3.2 (*)    Chloride 89 (*)    Glucose, Bld 133 (*)    All other components within normal limits  CBC WITH DIFFERENTIAL/PLATELET - Abnormal; Notable for the following components:   WBC 12.5 (*)    Neutro Abs 10.1 (*)    Lymphs Abs 0.6 (*)    Abs Immature Granulocytes 0.70 (*)    All other  components within normal limits  BRAIN NATRIURETIC PEPTIDE  TROPONIN I (HIGH SENSITIVITY)  TROPONIN I (HIGH SENSITIVITY)                                                                                                                          Radiology DG Chest Port 1 View  Result Date: 07/19/2022 CLINICAL DATA:  Shortness of breath, productive cough. EXAM: PORTABLE CHEST 1 VIEW COMPARISON:  None Available. FINDINGS: The heart size and mediastinal contours  are within normal limits. Both lungs are clear. The visualized skeletal structures are unremarkable. IMPRESSION: No active disease. Electronically Signed   By: Lupita Raider M.D.   On: 07/19/2022 10:51    Pertinent labs & imaging results that were available during my care of the patient were reviewed by me and considered in my medical decision making (see MDM for details).  Medications Ordered in ED Medications  dexamethasone (DECADRON) injection 10 mg (10 mg Other Given 07/19/22 1129)  ipratropium-albuterol (DUONEB) 0.5-2.5 (3) MG/3ML nebulizer solution 3 mL (3 mLs Nebulization Given 07/19/22 1130)                                                                                                                                     Procedures .Critical Care  Performed by: Sloan Leiter, DO Authorized by: Sloan Leiter, DO   Critical care provider statement:    Critical care time (minutes):  32   Critical care time was exclusive of:  Separately billable procedures and treating other patients   Critical care was necessary to treat or prevent imminent or life-threatening deterioration of the following conditions:  Respiratory failure   Critical care was time spent personally by me on the following activities:  Development of treatment plan with patient or surrogate, discussions with consultants, evaluation of patient's response to treatment, examination of patient, ordering and review of laboratory studies, ordering and review of  radiographic studies, ordering and performing treatments and interventions, pulse oximetry, re-evaluation of patient's condition, review of old charts and obtaining history from patient or surrogate   Care discussed with: admitting provider     (including critical care time)  Medical Decision Making / ED Course    Medical Decision Making:    Caliegh Trotman is a 72 y.o. female with past medical history as below, significant for asthma, Barrett's esophagus, hypertension, GERD, hiatal hernia, hypothyroid who presents to the ED with complaint of covid 19, difficulty, sent from PCP. The complaint involves an extensive differential diagnosis and also carries with it a high risk of complications and morbidity.  Serious etiology was considered. Ddx includes but is not limited to: In my evaluation of this patient's dyspnea my DDx includes, but is not limited to, pneumonia, pulmonary embolism, pneumothorax, pulmonary edema, metabolic acidosis, asthma, COPD, cardiac cause, anemia, anxiety, etc.    Complete initial physical exam performed, notably the patient  was on 2LNC, nad.    Reviewed and confirmed nursing documentation for past medical history, family history, social history.  Vital signs reviewed.    Clinical Course as of 07/19/22 1246  Fri Jul 19, 2022  1110 On ambient air desat to 87-88%, while on 2LNC pulse ox 98% [SG]  1111 COVID19 positive 5/10, will not repeat today [SG]    Clinical Course User Index [SG] Sloan Leiter, DO     Patient with COVID-19 infection approximately 1 week ago, progressively worsening  symptoms, breathing has worsened over the past few days, now requiring supplemental oxygen and does not use oxygen at home.  Minimal improvement following nebulized breathing treatment and Decadron.  Chest x-ray reviewed, no superimposed bacterial infection.  She is afebrile.  She has slight leukocytosis 12.5, mild hypokalemia which was replaced orally.  Troponin and BNP were  negative. EKG reviewed. No chest pain. Given new hypoxia in light of COVID-19 infection would recommend admission at this time.  She is agreeable.  Admitted to [facility]          Additional history obtained: -Additional history obtained from na -External records from outside source obtained and reviewed including: Chart review including previous notes, labs, imaging, consultation notes including care recommendation, prior labs and imaging, medications   Lab Tests: -I ordered, reviewed, and interpreted labs.   The pertinent results include:   Labs Reviewed  BASIC METABOLIC PANEL - Abnormal; Notable for the following components:      Result Value   Sodium 134 (*)    Potassium 3.2 (*)    Chloride 89 (*)    Glucose, Bld 133 (*)    All other components within normal limits  CBC WITH DIFFERENTIAL/PLATELET - Abnormal; Notable for the following components:   WBC 12.5 (*)    Neutro Abs 10.1 (*)    Lymphs Abs 0.6 (*)    Abs Immature Granulocytes 0.70 (*)    All other components within normal limits  BRAIN NATRIURETIC PEPTIDE  TROPONIN I (HIGH SENSITIVITY)  TROPONIN I (HIGH SENSITIVITY)    Notable for wbc + K -  EKG   EKG Interpretation  Date/Time:  Friday Jul 19 2022 10:16:30 EDT Ventricular Rate:  93 PR Interval:  207 QRS Duration: 117 QT Interval:  389 QTC Calculation: 484 R Axis:   -62 Text Interpretation: Sinus rhythm Borderline prolonged PR interval LAD, consider left anterior fascicular block Left ventricular hypertrophy Anterior Q waves, possibly due to LVH ST elevation suggests acute pericarditis no prior Confirmed by Tanda Rockers ([id number]) on 07/19/2022 12:46:01 PM         Imaging Studies ordered: I ordered imaging studies including cxr I independently visualized the following imaging with scope of interpretation limited to determining acute life threatening conditions related to emergency care; findings noted above, significant for stable  I independently visualized  and interpreted imaging. I agree with the radiologist interpretation   Medicines ordered and prescription drug management: Meds ordered this encounter  Medications   dexamethasone (DECADRON) injection 10 mg   ipratropium-albuterol (DUONEB) 0.5-2.5 (3) MG/3ML nebulizer solution 3 mL    -I have reviewed the patients home medicines and have made adjustments as needed   Consultations Obtained: na   Cardiac Monitoring: The patient was maintained on a cardiac monitor.  I personally viewed and interpreted the cardiac monitored which showed an underlying rhythm of: NSR Continuous pulse oximetry, on room air patient 85 to 87%, on nasal cannula she will saturate 96+  Social Determinants of Health:  Diagnosis or treatment significantly limited by social determinants of health: former smoker   Reevaluation: After the interventions noted above, I reevaluated the patient and found that they have stayed the same  Co morbidities that complicate the patient evaluation  Past Medical History:  Diagnosis Date   Anxiety    Asthma    Barrett esophagus    Chronic headaches    Colon polyps    COVID 03/2020   Depression    GERD (gastroesophageal reflux disease)    Hypertension  Kidney stones    Pneumonia    Thyroid disease    Uterine cancer (HCC)       Dispostion: Disposition decision including need for hospitalization was considered, and patient discharged from emergency department.    Final Clinical Impression(s) / ED Diagnoses Final diagnoses:  Respiratory distress  COVID-19  Hypokalemia     This chart was dictated using voice recognition software.  Despite best efforts to proofread,  errors can occur which can change the documentation meaning.    Tanda Rockers A, DO 07/19/22 1246

## 2022-07-19 NOTE — Progress Notes (Signed)
Acute Office Visit  Subjective:     Patient ID: Kimberly [name], female    DOB: 07/04/1950, 72 y.o.   MRN: [id number]  Chief Complaint  Patient presents with   Shortness of Breath    Pt has a productive cough with green mucus and sob - Called EMS  O2 stat at 52    HPI 72 year old female presents today with c/o worsening SOB, productive cough with yellow phlegm, tachypnea, x 3 days. She was seen on 05/10 and was diagnosed with COVID-19 and given Paxlovid. She was seen again on 05/13 for a viral conjunctivitis and fatigue. Today she is more SOB that is worse with exertion, coughing up more yellow phlegm. Complains of fever at home. She is accompanied by her neighbor who is very concerned about her decline over the last 3 days.   Review of Systems  Constitutional:  Positive for chills, fever and malaise/fatigue.  HENT:  Positive for congestion.   Respiratory:  Positive for cough, sputum production, shortness of breath and wheezing.   Cardiovascular: Negative.  Negative for chest pain and palpitations.  Gastrointestinal: Negative.   Musculoskeletal: Negative.   Neurological:  Positive for weakness. Negative for dizziness and headaches.  Psychiatric/Behavioral: Negative.     Past Medical History:  Diagnosis Date   Anxiety    Asthma    Barrett esophagus    Chronic headaches    Colon polyps    COVID 03/2020   Depression    GERD (gastroesophageal reflux disease)    Hypertension    Kidney stones    Pneumonia    Thyroid disease    Uterine cancer [facility])     Social History   Socioeconomic History   Marital status: Widowed    Spouse name: Richard   Number of children: 2   Years of education: 12 years   Highest education level: Associate degree: occupational, Scientist, product/process development, or vocational program  Occupational History   Occupation: retired    Comment: Retired  Tobacco Use   Smoking status: Former    Types: Cigarettes    Quit date: 1995    Years since quitting: 29.3   Smokeless  tobacco: Never  Vaping Use   Vaping Use: Never used  Substance and Sexual Activity   Alcohol use: Yes    Alcohol/week: 3.0 standard drinks of alcohol    Types: 3 Standard drinks or equivalent per week    Comment: 1 per day   Drug use: Never   Sexual activity: Not Currently    Birth control/protection: Surgical    Comment: Hyst  Other Topics Concern   Not on file  Social History Narrative   Recently moved from [place] to [place]. 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   Social Determinants of Health   Financial Resource Strain: Patient Declined (06/06/2022)   Overall Financial Resource Strain (CARDIA)    Difficulty of Paying Living Expenses: Patient declined  Food Insecurity: No Food Insecurity (06/06/2022)   Hunger Vital Sign    Worried About Running Out of Food in the Last Year: Never true    Ran Out of Food in the Last Year: Never true  Transportation Needs: No Transportation Needs (06/06/2022)   PRAPARE - Administrator, Civil Service (Medical): No    Lack of Transportation (Non-Medical): No  Physical Activity: Insufficiently Active (06/06/2022)   Exercise Vital Sign    Days of Exercise per Week: 5 days    Minutes of Exercise per Session: 20  min  Stress: Stress Concern Present (06/06/2022)   [employer] of Occupational Health - Occupational Stress Questionnaire    Feeling of Stress : To some extent  Social Connections: Moderately Integrated (06/06/2022)   Social Connection and Isolation Panel [NHANES]    Frequency of Communication with Friends and Family: More than three times a week    Frequency of Social Gatherings with Friends and Family: Twice a week    Attends Religious Services: More than 4 times per year    Active Member of [employer] or Organizations: Yes    Attends Banker Meetings: More than 4 times per year    Marital Status: Widowed  Intimate Partner Violence: Not At Risk (03/14/2021)   Humiliation, Afraid, Rape, and Kick  questionnaire    Fear of Current or Ex-Partner: No    Emotionally Abused: No    Physically Abused: No    Sexually Abused: No    Past Surgical History:  Procedure Laterality Date   ANTERIOR AND POSTERIOR VAGINAL REPAIR W/ SACROSPINOUS LIGAMENT SUSPENSION  06/29/2021   PARTIAL HYSTERECTOMY  1980   Pre-cancerous condition. Failed laser surgery. still have ovaries   UMBILICAL HERNIA REPAIR     with mesh   WRIST SURGERY Left 2019    Family History  Problem Relation Age of Onset   Diabetes Mother    Breast cancer Mother    Colon cancer Father    Kidney failure Father    COPD Brother    Diabetes Brother    Heart disease Brother    Heart disease Maternal Uncle    Heart disease Daughter    Depression Son    Esophageal cancer Neg Hx    Stomach cancer Neg Hx    Rectal cancer Neg Hx     Allergies  Allergen Reactions   Codeine Hives   Sulfa Antibiotics Hives and Itching    Current Outpatient Medications on File Prior to Visit  Medication Sig Dispense Refill   albuterol (VENTOLIN HFA) 108 (90 Base) MCG/ACT inhaler INHALE 2 PUFFS BY MOUTH EVERY 4 HOURS AS NEEDED 8.5 g 3   aspirin 81 MG chewable tablet Chew 81 mg by mouth daily.     Biotin 3 MG TABS Take by mouth.     buPROPion (WELLBUTRIN SR) 150 MG 12 hr tablet TAKE 1 TABLET(150 MG) BY MOUTH TWICE DAILY 180 tablet 3   chlorpheniramine-HYDROcodone (TUSSIONEX) 10-8 MG/5ML Take 5 mLs by mouth every 12 (twelve) hours as needed for cough. 70 mL 0   cholecalciferol (VITAMIN D3) 25 MCG (1000 UNIT) tablet      Cranberry 1000 MG CAPS Take by mouth.     diazepam (VALIUM) 5 MG tablet TAKE 1/2 TO 1 TABLET(2.5 TO 5 MG) BY MOUTH IN THE MORNING AND AT BEDTIME. MAY TAKE AN ADDITIONAL DOSE DAILY AS NEEDED 60 tablet 1   levothyroxine (SYNTHROID) 100 MCG tablet TAKE 1 TABLET(100 MCG) BY MOUTH DAILY 90 tablet 3   metoprolol succinate (TOPROL-XL) 25 MG 24 hr tablet Take 1 tablet (25 mg total) by mouth daily. 90 tablet 3   ondansetron (ZOFRAN) 4 MG  tablet Take 1 tablet (4 mg total) by mouth every 8 (eight) hours as needed for nausea or vomiting. 20 tablet 0   rizatriptan (MAXALT) 5 MG tablet Take 1 tablet (5 mg total) by mouth as needed for migraine. May repeat in 2 hours if needed 10 tablet 1   rOPINIRole (REQUIP) 0.5 MG tablet Take 1 tablet (0.5 mg total) by mouth at  bedtime. (Patient taking differently: Take 0.5 mg by mouth at bedtime. Takes as needed) 30 tablet 5   [DISCONTINUED] famotidine 20 MG/2ML SOLN      No current facility-administered medications on file prior to visit.    BP 122/84 (BP Location: Left Arm, Patient Position: Sitting, Cuff Size: Normal)   Pulse (!) 115   Resp (!) 22   Ht 5' 1.5" (1.562 m)   Wt 138 lb (62.6 kg)   SpO2 (!) 88% Comment: Gave o2 and 2L  BMI 25.65 kg/m chart      Objective:    BP 122/84 (BP Location: Left Arm, Patient Position: Sitting, Cuff Size: Normal)   Pulse (!) 115   Resp (!) 22   Ht 5' 1.5" (1.562 m)   Wt 138 lb (62.6 kg)   SpO2 (!) 88% Comment: Gave o2 and 2L  BMI 25.65 kg/m    Physical Exam Constitutional:      General: She is in acute distress.     Appearance: She is ill-appearing and diaphoretic.  Cardiovascular:     Rate and Rhythm: Tachycardia present.     Heart sounds: Normal heart sounds.  Pulmonary:     Effort: Tachypnea, accessory muscle usage and respiratory distress present.     Breath sounds: Examination of the right-upper field reveals wheezing. Examination of the left-upper field reveals wheezing. Examination of the right-middle field reveals wheezing. Examination of the left-middle field reveals wheezing. Examination of the right-lower field reveals wheezing. Examination of the left-lower field reveals wheezing. Wheezing present.     Comments: 87-88% on RA, Placed on 3 liter nasal canula and up to 91%. Upon EMS arrival, she was increased to 4 liters [place] with o2 sats 95%. Musculoskeletal:        General: Normal range of motion.     Cervical back: Normal range  of motion and neck supple.  Skin:    General: Skin is warm.  Neurological:     General: No focal deficit present.     Mental Status: She is alert and oriented to person, place, and time.  Psychiatric:        Mood and Affect: Mood normal.        Behavior: Behavior normal.    Report was given to EMS and patient transported to [facility].   No results found for any visits on 07/19/22.      Assessment & Plan:   Problem List Items Addressed This Visit     COVID-19   Other Visit Diagnoses     Acute respiratory distress    -  Primary   Tachycardia          EMS called and patient will be transported to the ED for further evaluation and r/o pneumonia. Her PCP, Dr. Veto Kemps was notified.   No orders of the defined types were placed in this encounter.   No follow-ups on file.  Eulis Foster, FNP

## 2022-07-20 DIAGNOSIS — F32A Depression, unspecified: Secondary | ICD-10-CM | POA: Diagnosis present

## 2022-07-20 DIAGNOSIS — J9601 Acute respiratory failure with hypoxia: Secondary | ICD-10-CM | POA: Diagnosis present

## 2022-07-20 DIAGNOSIS — R630 Anorexia: Secondary | ICD-10-CM | POA: Diagnosis present

## 2022-07-20 DIAGNOSIS — Z825 Family history of asthma and other chronic lower respiratory diseases: Secondary | ICD-10-CM | POA: Diagnosis not present

## 2022-07-20 DIAGNOSIS — R0603 Acute respiratory distress: Secondary | ICD-10-CM | POA: Diagnosis not present

## 2022-07-20 DIAGNOSIS — U099 Post covid-19 condition, unspecified: Secondary | ICD-10-CM | POA: Diagnosis present

## 2022-07-20 DIAGNOSIS — U071 COVID-19: Principal | ICD-10-CM | POA: Diagnosis present

## 2022-07-20 DIAGNOSIS — Z87442 Personal history of urinary calculi: Secondary | ICD-10-CM | POA: Diagnosis not present

## 2022-07-20 DIAGNOSIS — R0602 Shortness of breath: Secondary | ICD-10-CM | POA: Diagnosis present

## 2022-07-20 DIAGNOSIS — J45901 Unspecified asthma with (acute) exacerbation: Secondary | ICD-10-CM | POA: Diagnosis present

## 2022-07-20 DIAGNOSIS — F419 Anxiety disorder, unspecified: Secondary | ICD-10-CM | POA: Diagnosis present

## 2022-07-20 DIAGNOSIS — G2581 Restless legs syndrome: Secondary | ICD-10-CM | POA: Diagnosis present

## 2022-07-20 DIAGNOSIS — Z8249 Family history of ischemic heart disease and other diseases of the circulatory system: Secondary | ICD-10-CM | POA: Diagnosis not present

## 2022-07-20 DIAGNOSIS — Z86010 Personal history of colonic polyps: Secondary | ICD-10-CM | POA: Diagnosis not present

## 2022-07-20 DIAGNOSIS — R519 Headache, unspecified: Secondary | ICD-10-CM | POA: Diagnosis present

## 2022-07-20 DIAGNOSIS — E876 Hypokalemia: Secondary | ICD-10-CM

## 2022-07-20 DIAGNOSIS — K2270 Barrett's esophagus without dysplasia: Secondary | ICD-10-CM | POA: Diagnosis present

## 2022-07-20 DIAGNOSIS — Z8701 Personal history of pneumonia (recurrent): Secondary | ICD-10-CM | POA: Diagnosis not present

## 2022-07-20 DIAGNOSIS — Z90711 Acquired absence of uterus with remaining cervical stump: Secondary | ICD-10-CM | POA: Diagnosis not present

## 2022-07-20 DIAGNOSIS — K219 Gastro-esophageal reflux disease without esophagitis: Secondary | ICD-10-CM | POA: Diagnosis present

## 2022-07-20 DIAGNOSIS — Z87891 Personal history of nicotine dependence: Secondary | ICD-10-CM | POA: Diagnosis not present

## 2022-07-20 DIAGNOSIS — E039 Hypothyroidism, unspecified: Secondary | ICD-10-CM | POA: Diagnosis present

## 2022-07-20 DIAGNOSIS — I10 Essential (primary) hypertension: Secondary | ICD-10-CM | POA: Diagnosis present

## 2022-07-20 DIAGNOSIS — Z8542 Personal history of malignant neoplasm of other parts of uterus: Secondary | ICD-10-CM | POA: Diagnosis not present

## 2022-07-20 DIAGNOSIS — Z800 Family history of malignant neoplasm of digestive organs: Secondary | ICD-10-CM | POA: Diagnosis not present

## 2022-07-20 DIAGNOSIS — Z6826 Body mass index (BMI) 26.0-26.9, adult: Secondary | ICD-10-CM | POA: Diagnosis not present

## 2022-07-20 DIAGNOSIS — E663 Overweight: Secondary | ICD-10-CM | POA: Diagnosis present

## 2022-07-20 LAB — HIV ANTIBODY (ROUTINE TESTING W REFLEX): HIV Screen 4th Generation wRfx: NONREACTIVE

## 2022-07-20 LAB — COMPREHENSIVE METABOLIC PANEL
ALT: 17 U/L (ref 0–44)
AST: 19 U/L (ref 15–41)
Albumin: 3.1 g/dL — ABNORMAL LOW (ref 3.5–5.0)
Alkaline Phosphatase: 72 U/L (ref 38–126)
Anion gap: 11 (ref 5–15)
BUN: 18 mg/dL (ref 8–23)
CO2: 32 mmol/L (ref 22–32)
Calcium: 9.1 mg/dL (ref 8.9–10.3)
Chloride: 91 mmol/L — ABNORMAL LOW (ref 98–111)
Creatinine, Ser: 0.63 mg/dL (ref 0.44–1.00)
GFR, Estimated: >60 mL/min (ref >60)
Glucose, Bld: 184 mg/dL — ABNORMAL HIGH (ref 70–99)
Potassium: 3.5 mmol/L (ref 3.5–5.1)
Sodium: 134 mmol/L — ABNORMAL LOW (ref 135–145)
Total Bilirubin: 0.5 mg/dL (ref 0.3–1.2)
Total Protein: 7 g/dL (ref 6.5–8.1)

## 2022-07-20 LAB — CBC
HCT: 36.8 % (ref 36.0–46.0)
Hemoglobin: 12.4 g/dL (ref 12.0–15.0)
MCH: 28.4 pg (ref 26.0–34.0)
MCHC: 33.7 g/dL (ref 30.0–36.0)
MCV: 84.2 fL (ref 80.0–100.0)
Platelets: 227 10*3/uL (ref 150–400)
RBC: 4.37 MIL/uL (ref 3.87–5.11)
RDW: 12.9 % (ref 11.5–15.5)
WBC: 8.5 10*3/uL (ref 4.0–10.5)
nRBC: 0 % (ref 0.0–0.2)

## 2022-07-20 LAB — PROCALCITONIN: Procalcitonin: <0.1 ng/mL

## 2022-07-20 LAB — C-REACTIVE PROTEIN: CRP: 30.1 mg/dL — ABNORMAL HIGH (ref <1.0)

## 2022-07-20 LAB — FERRITIN: Ferritin: 438 ng/mL — ABNORMAL HIGH (ref 11–307)

## 2022-07-20 MED ORDER — METOPROLOL SUCCINATE ER 50 MG PO TB24
25.0000 mg | ORAL_TABLET | Freq: Every day | ORAL | Status: DC
  Administered 2022-07-20 – 2022-07-21 (×2): 25 mg via ORAL
  Filled 2022-07-20 (×2): qty 1

## 2022-07-20 MED ORDER — IPRATROPIUM-ALBUTEROL 0.5-2.5 (3) MG/3ML IN SOLN
3.0000 mL | Freq: Two times a day (BID) | RESPIRATORY_TRACT | Status: DC
  Administered 2022-07-20 – 2022-07-22 (×4): 3 mL via RESPIRATORY_TRACT
  Filled 2022-07-20 (×4): qty 3

## 2022-07-20 MED ORDER — FLUTICASONE PROPIONATE 50 MCG/ACT NA SUSP
1.0000 | Freq: Every day | NASAL | Status: DC
  Administered 2022-07-20 – 2022-07-22 (×3): 1 via NASAL
  Filled 2022-07-20: qty 16

## 2022-07-20 NOTE — Hospital Course (Signed)
72 y.o. female with medical history significant of anxiety, depression, asthma, Barrett's esophagus, chronic headaches, colon polyps, GERD, hypertension, nephrolithiasis, thyroid disease, uterine cancer who was recently diagnosed with COVID-19 and was treated with Paxlovid but comes to the ED due to dyspnea, wheezing, productive cough and low-grade temperatures.  4 days PTA, reportedly developed conjunctivitis.  She initially had rhinorrhea, sore throat & was pressured.  Denied hemoptysis.Marland Kitchen  No chest pain, palpitations, diaphoresis, PND, orthopnea or pitting edema of the lower extremities.  No abdominal pain, nausea, emesis, diarrhea, constipation, melena or hematochezia.  No flank pain, dysuria, frequency or hematuria.  No polyuria, polydipsia, polyphagia or blurred vision

## 2022-07-20 NOTE — Progress Notes (Signed)
  Progress Note   Patient: Kimberly [name] UJW:[id number] DOB: January 21, 1951 DOA: 07/19/2022     0 DOS: the patient was seen and examined on 07/20/2022   Brief hospital course: 72 y.o. female with medical history significant of anxiety, depression, asthma, Barrett's esophagus, chronic headaches, colon polyps, GERD, hypertension, nephrolithiasis, thyroid disease, uterine cancer who was recently diagnosed with COVID-19 and was treated with Paxlovid but comes to the ED due to dyspnea, wheezing, productive cough and low-grade temperatures.  4 days PTA, reportedly developed conjunctivitis.  She initially had rhinorrhea, sore throat & was pressured.  Denied hemoptysis.Marland Kitchen  No chest pain, palpitations, diaphoresis, PND, orthopnea or pitting edema of the lower extremities.  No abdominal pain, nausea, emesis, diarrhea, constipation, melena or hematochezia.  No flank pain, dysuria, frequency or hematuria.  No polyuria, polydipsia, polyphagia or blurred vision   Assessment and Plan:  Principal Problem:   Acute respiratory failure with hypoxia (HCC) In the setting of:   COVID-19 virus infection induced   Asthma exacerbation -continue bronchodilators as needed. -CRP and folate both elevated at 30.1 and 438, respectively -continue scheduled decadron -reportedly did not tolerate paxolovid PTA and stopped prematurely -CXR reviewed, clear -For now, would cont supportive care with steroids. May consider remdesivir if symptoms worse. Discussed with pharmacy -recheck inflammatory markers in AM   Active Problems:   Hypothyroidism Continue levothyroxine 100 mcg p.o. daily. Will check TSH in AM     Essential hypertension Continue metoprolol succinate 25 mg p.o. daily at bedtime. -BP stable at this time     Restless leg syndrome No longer taking Requip.     GERD (gastroesophageal reflux disease) Antiacid, H2 blocker or PPI as needed.     Overweight (BMI 25.0-29.9) Lifestyle modifications.   Subjective: Still  coughing this AM. Reports decreased appetite, lack of taste and smell  Physical Exam: Vitals:   07/20/22 0100 07/20/22 0408 07/20/22 0811 07/20/22 1320  BP: 126/89 (!) 152/77  (!) 134/91  Pulse:  73  84  Resp:  18  18  Temp: 97.9 F (36.6 C) 97.9 F (36.6 C)  98.3 F (36.8 C)  TempSrc: Oral Oral  Oral  SpO2: 97% 96% 96% 91%  Weight:      Height:       General exam: Awake, laying in bed, in nad Respiratory system: Normal respiratory effort, no wheezing Cardiovascular system: regular rate, s1, s2 Gastrointestinal system: Soft, nondistended, positive BS Central nervous system: CN2-12 grossly intact, strength intact Extremities: Perfused, no clubbing Skin: Normal skin turgor, no notable skin lesions seen Psychiatry: Mood normal // no visual hallucinations   Data Reviewed:  Labs reviewed: Na 134, K 3.5, Cr 0.63, CRP 30.1, Ferritin 438   Family Communication: Pt in room, family at bedside  Disposition: Status is: Inpatient Remains inpatient appropriate because: Severity of illness  Planned Discharge Destination: Home     Author: Rickey Barbara, MD 07/20/2022 3:41 PM  For on call review [email or web address].

## 2022-07-21 DIAGNOSIS — U071 COVID-19: Secondary | ICD-10-CM | POA: Diagnosis not present

## 2022-07-21 DIAGNOSIS — E876 Hypokalemia: Secondary | ICD-10-CM | POA: Diagnosis not present

## 2022-07-21 DIAGNOSIS — J9601 Acute respiratory failure with hypoxia: Secondary | ICD-10-CM | POA: Diagnosis not present

## 2022-07-21 LAB — COMPREHENSIVE METABOLIC PANEL
ALT: 20 U/L (ref 0–44)
AST: 24 U/L (ref 15–41)
Albumin: 3.3 g/dL — ABNORMAL LOW (ref 3.5–5.0)
Alkaline Phosphatase: 75 U/L (ref 38–126)
Anion gap: 13 (ref 5–15)
BUN: 23 mg/dL (ref 8–23)
CO2: 30 mmol/L (ref 22–32)
Calcium: 9.2 mg/dL (ref 8.9–10.3)
Chloride: 89 mmol/L — ABNORMAL LOW (ref 98–111)
Creatinine, Ser: 0.73 mg/dL (ref 0.44–1.00)
GFR, Estimated: >60 mL/min (ref >60)
Glucose, Bld: 193 mg/dL — ABNORMAL HIGH (ref 70–99)
Potassium: 3.4 mmol/L — ABNORMAL LOW (ref 3.5–5.1)
Sodium: 132 mmol/L — ABNORMAL LOW (ref 135–145)
Total Bilirubin: 0.4 mg/dL (ref 0.3–1.2)
Total Protein: 7.1 g/dL (ref 6.5–8.1)

## 2022-07-21 LAB — FERRITIN: Ferritin: 381 ng/mL — ABNORMAL HIGH (ref 11–307)

## 2022-07-21 LAB — C-REACTIVE PROTEIN: CRP: 17.5 mg/dL — ABNORMAL HIGH (ref <1.0)

## 2022-07-21 LAB — TSH: TSH: 0.123 u[IU]/mL — ABNORMAL LOW (ref 0.350–4.500)

## 2022-07-21 MED ORDER — POTASSIUM CHLORIDE CRYS ER 20 MEQ PO TBCR
60.0000 meq | EXTENDED_RELEASE_TABLET | Freq: Once | ORAL | Status: AC
  Administered 2022-07-21: 60 meq via ORAL
  Filled 2022-07-21: qty 3

## 2022-07-21 MED ORDER — LEVOTHYROXINE SODIUM 75 MCG PO TABS
75.0000 ug | ORAL_TABLET | Freq: Every day | ORAL | Status: DC
  Administered 2022-07-22: 75 ug via ORAL
  Filled 2022-07-21: qty 1

## 2022-07-21 NOTE — Progress Notes (Signed)
  Progress Note   Patient: Kimberly [name] WJX:[id number] DOB: 1950/05/29 DOA: 07/19/2022     1 DOS: the patient was seen and examined on 07/21/2022   Brief hospital course: 72 y.o. female with medical history significant of anxiety, depression, asthma, Barrett's esophagus, chronic headaches, colon polyps, GERD, hypertension, nephrolithiasis, thyroid disease, uterine cancer who was recently diagnosed with COVID-19 and was treated with Paxlovid but comes to the ED due to dyspnea, wheezing, productive cough and low-grade temperatures.  4 days PTA, reportedly developed conjunctivitis.  She initially had rhinorrhea, sore throat & was pressured.  Denied hemoptysis.Marland Kitchen  No chest pain, palpitations, diaphoresis, PND, orthopnea or pitting edema of the lower extremities.  No abdominal pain, nausea, emesis, diarrhea, constipation, melena or hematochezia.  No flank pain, dysuria, frequency or hematuria.  No polyuria, polydipsia, polyphagia or blurred vision   Assessment and Plan:  Principal Problem:   Acute respiratory failure with hypoxia (HCC) In the setting of:   COVID-19 virus infection induced   Asthma exacerbation -continue bronchodilators as needed. -CRP and folate both trending down -continue scheduled decadron -clinically improving -CXR reviewed, clear -For now, would cont supportive care with steroids.   Active Problems:   Hypothyroidism Continued on levothyroxine 100 mcg p.o. daily. TSH low at 0.123 Will decrease levothyroxine to and would recheck TSH in 4-6 weeks     Essential hypertension Continue metoprolol succinate 25 mg p.o. daily at bedtime. -BP stable at this time     Restless leg syndrome No longer taking Requip.     GERD (gastroesophageal reflux disease) Antiacid, H2 blocker or PPI as needed.     Overweight (BMI 25.0-29.9) Lifestyle modifications.   Subjective: Feeling better today. Still coughing  Physical Exam: Vitals:   07/21/22 0733 07/21/22 0736 07/21/22  1236 07/21/22 1300  BP:   134/85   Pulse:   80   Resp:   18   Temp:   98.9 F (37.2 C)   TempSrc:   Oral   SpO2: 91% 91% (!) 85% 94%  Weight:      Height:       General exam: Conversant, in no acute distress Respiratory system: normal chest rise, clear, no audible wheezing Cardiovascular system: regular rhythm, s1-s2 Gastrointestinal system: Nondistended, nontender, pos BS Central nervous system: No seizures, no tremors Extremities: No cyanosis, no joint deformities Skin: No rashes, no pallor Psychiatry: Affect normal // no auditory hallucinations   Data Reviewed:  Labs reviewed: Na 132, K 3.4, Cr 0.73, CRP 17.5   Family Communication: Pt in room, family not at bedside  Disposition: Status is: Inpatient Remains inpatient appropriate because: Severity of illness  Planned Discharge Destination: Home     Author: Rickey Barbara, MD 07/21/2022 3:27 PM  For on call review [email or web address].

## 2022-07-22 DIAGNOSIS — E876 Hypokalemia: Secondary | ICD-10-CM | POA: Diagnosis not present

## 2022-07-22 DIAGNOSIS — R0603 Acute respiratory distress: Secondary | ICD-10-CM

## 2022-07-22 DIAGNOSIS — U071 COVID-19: Secondary | ICD-10-CM | POA: Diagnosis not present

## 2022-07-22 LAB — COMPREHENSIVE METABOLIC PANEL
ALT: 22 U/L (ref 0–44)
AST: 26 U/L (ref 15–41)
Albumin: 3.3 g/dL — ABNORMAL LOW (ref 3.5–5.0)
Alkaline Phosphatase: 66 U/L (ref 38–126)
Anion gap: 10 (ref 5–15)
BUN: 21 mg/dL (ref 8–23)
CO2: 29 mmol/L (ref 22–32)
Calcium: 9.4 mg/dL (ref 8.9–10.3)
Chloride: 95 mmol/L — ABNORMAL LOW (ref 98–111)
Creatinine, Ser: 0.74 mg/dL (ref 0.44–1.00)
GFR, Estimated: >60 mL/min (ref >60)
Glucose, Bld: 162 mg/dL — ABNORMAL HIGH (ref 70–99)
Potassium: 4.5 mmol/L (ref 3.5–5.1)
Sodium: 134 mmol/L — ABNORMAL LOW (ref 135–145)
Total Bilirubin: 0.5 mg/dL (ref 0.3–1.2)
Total Protein: 6.6 g/dL (ref 6.5–8.1)

## 2022-07-22 LAB — C-REACTIVE PROTEIN: CRP: 8.8 mg/dL — ABNORMAL HIGH (ref <1.0)

## 2022-07-22 LAB — FERRITIN: Ferritin: 308 ng/mL — ABNORMAL HIGH (ref 11–307)

## 2022-07-22 MED ORDER — DEXAMETHASONE 6 MG PO TABS: 6.0000 mg | ORAL_TABLET | ORAL | 0 refills | Status: AC

## 2022-07-22 MED ORDER — HYDROCOD POLI-CHLORPHE POLI ER 10-8 MG/5ML PO SUER: 5.0000 mL | Freq: Two times a day (BID) | ORAL | 0 refills | Status: DC | PRN

## 2022-07-22 MED ORDER — FLUTICASONE PROPIONATE 50 MCG/ACT NA SUSP: 1.0000 | Freq: Every day | NASAL | Status: DC

## 2022-07-22 MED ORDER — LEVOTHYROXINE SODIUM 75 MCG PO TABS: 75.0000 ug | ORAL_TABLET | Freq: Every day | ORAL | 0 refills | Status: DC

## 2022-07-22 NOTE — Progress Notes (Signed)
Mobility Specialist - Progress Note   07/22/22 1008  Mobility  Activity Ambulated independently in room  Level of Assistance Independent  Assistive Device None  Distance Ambulated (ft) 30 ft  Activity Response Tolerated well  Mobility Referral Yes  $Mobility charge 1 Mobility  Mobility Specialist Start Time (ACUTE ONLY) [id number]  Mobility Specialist Stop Time (ACUTE ONLY) 1004  Mobility Specialist Time Calculation (min) (ACUTE ONLY) 6 min   Pt received in bed and agreeable to mobility. Pt opted out to ambulate around bed several times. No complaints during session. Pt to bed after session with all needs met.   [facility]

## 2022-07-22 NOTE — Discharge Summary (Signed)
Physician Discharge Summary   Patient: Kimberly [name] MRN: [id number] DOB: 26-Feb-1951  Admit date:     07/19/2022  Discharge date: 07/22/22  Discharge Physician: Rickey Barbara   PCP: Loyola Mast, MD   Recommendations at discharge:    Follow up with PCP in 1-2 weeks Recommend rechecking TSH in 4-6 weeks  Discharge Diagnoses: Principal Problem:   Acute respiratory failure with hypoxia (HCC) Active Problems:   Hypothyroidism   Essential hypertension   Restless leg syndrome   GERD (gastroesophageal reflux disease)   Overweight (BMI 25.0-29.9)   COVID-19 virus infection   Asthma exacerbation   COVID-19  Resolved Problems:   * No resolved hospital problems. *  Hospital Course: 72 y.o. female with medical history significant of anxiety, depression, asthma, Barrett's esophagus, chronic headaches, colon polyps, GERD, hypertension, nephrolithiasis, thyroid disease, uterine cancer who was recently diagnosed with COVID-19 and was treated with Paxlovid but comes to the ED due to dyspnea, wheezing, productive cough and low-grade temperatures.  4 days PTA, reportedly developed conjunctivitis.  She initially had rhinorrhea, sore throat & was pressured.  Denied hemoptysis.Marland Kitchen  No chest pain, palpitations, diaphoresis, PND, orthopnea or pitting edema of the lower extremities.  No abdominal pain, nausea, emesis, diarrhea, constipation, melena or hematochezia.  No flank pain, dysuria, frequency or hematuria.  No polyuria, polydipsia, polyphagia or blurred vision   Assessment and Plan: Principal Problem:   Acute respiratory failure with hypoxia (HCC) In the setting of:   COVID-19 virus infection induced   Asthma exacerbation -CRP and folate both trending down -continue scheduled decadron to complete 10 days -clinically improving -CXR reviewed, clear   Active Problems:   Hypothyroidism Continued on levothyroxine 100 mcg p.o. daily. TSH low at 0.123 Will decrease levothyroxine to and  would recheck TSH in 4-6 weeks     Essential hypertension Continue metoprolol succinate 25 mg p.o. daily at bedtime. -BP remained stable     Restless leg syndrome No longer taking Requip.     GERD (gastroesophageal reflux disease) Antiacid, H2 blocker or PPI as needed.     Overweight (BMI 25.0-29.9) Lifestyle modifications.    Consultants:  Procedures performed:   Disposition: Home Diet recommendation:  Regular diet DISCHARGE MEDICATION: Allergies as of 07/22/2022       Reactions   Codeine Hives   Sulfa Antibiotics Hives, Itching        Medication List     TAKE these medications    albuterol 108 (90 Base) MCG/ACT inhaler Commonly known as: VENTOLIN HFA INHALE 2 PUFFS BY MOUTH EVERY 4 HOURS AS NEEDED   aspirin 81 MG chewable tablet Chew 81 mg by mouth daily.   Biotin 3 MG Tabs Take by mouth.   buPROPion 150 MG 12 hr tablet Commonly known as: WELLBUTRIN SR TAKE 1 TABLET(150 MG) BY MOUTH TWICE DAILY What changed: See the new instructions.   chlorpheniramine-HYDROcodone 10-8 MG/5ML Commonly known as: TUSSIONEX Take 5 mLs by mouth every 12 (twelve) hours as needed for cough.   cholecalciferol 25 MCG (1000 UNIT) tablet Commonly known as: VITAMIN D3   Cranberry 1000 MG Caps Take by mouth.   dexamethasone 6 MG tablet Commonly known as: DECADRON Take 1 tablet (6 mg total) by mouth daily for 7 days. Start taking on: Jul 23, 2022   diazepam 5 MG tablet Commonly known as: VALIUM TAKE 1/2 TO 1 TABLET(2.5 TO 5 MG) BY MOUTH IN THE MORNING AND AT BEDTIME. MAY TAKE AN ADDITIONAL DOSE DAILY AS NEEDED   fluticasone  50 MCG/ACT nasal spray Commonly known as: FLONASE Place 1 spray into both nostrils daily. Start taking on: Jul 23, 2022   levothyroxine 75 MCG tablet Commonly known as: SYNTHROID Take 1 tablet (75 mcg total) by mouth daily at 6 (six) AM. Start taking on: Jul 23, 2022 What changed:  medication strength See the new instructions.   metoprolol  succinate 25 MG 24 hr tablet Commonly known as: TOPROL-XL Take 1 tablet (25 mg total) by mouth daily. What changed: when to take this   ondansetron 4 MG tablet Commonly known as: Zofran Take 1 tablet (4 mg total) by mouth every 8 (eight) hours as needed for nausea or vomiting.   rOPINIRole 0.5 MG tablet Commonly known as: REQUIP Take 1 tablet (0.5 mg total) by mouth at bedtime.        Follow-up Information     Loyola Mast, MD Follow up in 2 week(s).   Specialty: Family Medicine Why: Hospital follow up Contact information: [address] [place] [address] [phone]                Discharge Exam: Ceasar Mons Weights   07/19/22 1014  Weight: 62.6 kg   General exam: Awake, laying in bed, in nad Respiratory system: Normal respiratory effort, no wheezing Cardiovascular system: regular rate, s1, s2 Gastrointestinal system: Soft, nondistended, positive BS Central nervous system: CN2-12 grossly intact, strength intact Extremities: Perfused, no clubbing Skin: Normal skin turgor, no notable skin lesions seen Psychiatry: Mood normal // no visual hallucinations   Condition at discharge: fair  The results of significant diagnostics from this hospitalization (including imaging, microbiology, ancillary and laboratory) are listed below for reference.   Imaging Studies: DG Chest Port 1 View  Result Date: 07/19/2022 CLINICAL DATA:  Shortness of breath, productive cough. EXAM: PORTABLE CHEST 1 VIEW COMPARISON:  None Available. FINDINGS: The heart size and mediastinal contours are within normal limits. Both lungs are clear. The visualized skeletal structures are unremarkable. IMPRESSION: No active disease. Electronically Signed   By: Lupita Raider M.D.   On: 07/19/2022 10:51    Microbiology: No results found for this or any previous visit.  Labs: CBC: Recent Labs  Lab 07/19/22 1019 07/20/22 0557  WBC 12.5* 8.5  NEUTROABS 10.1*  --   HGB 13.4 12.4  HCT  40.2 36.8  MCV 84.6 84.2  PLT 259 227   Basic Metabolic Panel: Recent Labs  Lab 07/19/22 1019 07/20/22 0557 07/21/22 0426 07/22/22 0500  NA 134* 134* 132* 134*  K 3.2* 3.5 3.4* 4.5  CL 89* 91* 89* 95*  CO2 31 32 30 29  GLUCOSE 133* 184* 193* 162*  BUN 16 18 23 21   CREATININE 0.87 0.63 0.73 0.74  CALCIUM 9.2 9.1 9.2 9.4   Liver Function Tests: Recent Labs  Lab 07/20/22 0557 07/21/22 0426 07/22/22 0500  AST 19 24 26   ALT 17 20 22   ALKPHOS 72 75 66  BILITOT 0.5 0.4 0.5  PROT 7.0 7.1 6.6  ALBUMIN 3.1* 3.3* 3.3*   CBG: No results for input(s): "GLUCAP" in the last 168 hours.  Discharge time spent: less than 30 minutes.  Signed: Rickey Barbara, MD [facility] 07/22/2022

## 2022-07-23 ENCOUNTER — Telehealth: Payer: Self-pay

## 2022-07-23 ENCOUNTER — Encounter: Payer: Self-pay | Admitting: Family Medicine

## 2022-07-23 NOTE — Transitions of Care (Post Inpatient/ED Visit) (Deleted)
07/23/2022  Name: Nicholas Mayweather MRN: [id number] DOB: [birth year]  Today's TOC FU Call Status: Today's TOC FU Call Status:: Successful TOC FU Call Competed TOC FU Call Complete Date: 07/23/22  Transition Care Management Follow-up Telephone Call Date of Discharge: 07/22/22 Discharge Facility: [facility]) Type of Discharge: Inpatient Admission Primary Inpatient Discharge Diagnosis:: Acute respiratory failure with hypoxia How have you been since you were released from the hospital?: Same Any questions or concerns?: Yes Patient Questions/Concerns:: patient is not sleeping, states she is waking up coughing and crying uncontrollably. She did confirm she is taking the Tussionex BID, but doesn't feel like it is helping. Patient Questions/Concerns Addressed: Notified Provider of Patient Questions/Concerns  Items Reviewed: Did you receive and understand the discharge instructions provided?: Yes Medications obtained,verified, and reconciled?: Yes (Medications Reviewed) Any new allergies since your discharge?: No Dietary orders reviewed?: NA Do you have support at home?: No (patient states her daughter is calling and checking on her)  Medications Reviewed Today: Medications Reviewed Today     Reviewed by Leigh Aurora, CMA (Certified Medical Assistant) on 07/23/22 at [phone]  Med List Status: <None>   Medication Order Taking? Sig Documenting Provider Last Dose Status Informant  albuterol (VENTOLIN HFA) 108 (90 Base) MCG/ACT inhaler [id number] No INHALE 2 PUFFS BY MOUTH EVERY 4 HOURS AS NEEDED Loyola Mast, MD Past Month Active Self  aspirin 81 MG chewable tablet [id number] No Chew 81 mg by mouth daily. [provider] Past Week Active Self  Biotin 3 MG TABS [id number] No Take by mouth. [provider] Past Week Active Self  buPROPion (WELLBUTRIN SR) 150 MG 12 hr tablet [id number] No TAKE 1 TABLET(150 MG) BY MOUTH TWICE DAILY  Patient taking differently: Take 150 mg by  mouth 2 (two) times daily.   Loyola Mast, MD 07/18/2022 Active Self  chlorpheniramine-HYDROcodone (TUSSIONEX) 10-8 MG/5ML [id number]  Take 5 mLs by mouth every 12 (twelve) hours as needed for cough. Jerald Kief, MD  Active   cholecalciferol (VITAMIN D3) 25 MCG (1000 UNIT) tablet [id number] No  [provider] Past Week Active Self  Cranberry 1000 MG CAPS [id number] No Take by mouth. [provider] Past Week Active Self  dexamethasone (DECADRON) 6 MG tablet [id number]  Take 1 tablet (6 mg total) by mouth daily for 7 days. Jerald Kief, MD  Active   diazepam (VALIUM) 5 MG tablet [id number] No TAKE 1/2 TO 1 TABLET(2.5 TO 5 MG) BY MOUTH IN THE MORNING AND AT BEDTIME. MAY TAKE AN ADDITIONAL DOSE DAILY AS NEEDED Loyola Mast, MD Past Month Active Self   Discontinued 06/14/20 0858   fluticasone (FLONASE) 50 MCG/ACT nasal spray [id number]  Place 1 spray into both nostrils daily. Jerald Kief, MD  Active   levothyroxine (SYNTHROID) 75 MCG tablet [id number]  Take 1 tablet (75 mcg total) by mouth daily at 6 (six) AM. Jerald Kief, MD  Active   metoprolol succinate (TOPROL-XL) 25 MG 24 hr tablet [id number] No Take 1 tablet (25 mg total) by mouth daily.  Patient taking differently: Take 25 mg by mouth at bedtime.   Loyola Mast, MD 07/18/2022 20:00 Active Self  ondansetron (ZOFRAN) 4 MG tablet [id number] No Take 1 tablet (4 mg total) by mouth every 8 (eight) hours as needed for nausea or vomiting. Loyola Mast, MD Past Month Active Self  rOPINIRole (REQUIP) 0.5 MG tablet [id number] No Take 1 tablet (0.5 mg total) by mouth at bedtime. Loyola Mast, MD  Past Month Active Self            Home Care and Equipment/Supplies: Were Home Health Services Ordered?: NA Any new equipment or medical supplies ordered?: NA  Functional Questionnaire: Do you need assistance with bathing/showering or dressing?: No Do you need assistance with meal preparation?: No Do you need assistance  with eating?: No Do you have difficulty maintaining continence: No Do you need assistance with getting out of bed/getting out of a chair/moving?: No Do you have difficulty managing or taking your medications?: No  Follow up appointments reviewed: PCP Follow-up appointment confirmed?: Yes Date of PCP follow-up appointment?: 07/25/22 Follow-up Provider: Dr. Veto Kemps Specialist [facility] Follow-up appointment confirmed?: NA Do you need transportation to your follow-up appointment?: No Do you understand care options if your condition(s) worsen?: Yes-patient verbalized understanding    SIGNATURE Agnes Lawrence, CMA (AAMA)  [facility] [phone]

## 2022-07-23 NOTE — Transitions of Care (Post Inpatient/ED Visit) (Signed)
07/23/2022  Name: Kimberly [name] MRN: [id number] DOB: 10-12-1950  Today's TOC FU Call Status: Today's TOC FU Call Status:: Successful TOC FU Call Competed TOC FU Call Complete Date: 07/23/22  Transition Care Management Follow-up Telephone Call Date of Discharge: 07/22/22 [facility]charge Facility: [facility]) Type of Discharge: Inpatient Admission Primary Inpatient Discharge Diagnosis:: Acute respiratory failure with hypoxia How have you been since you were released from the hospital?: Same Any questions or concerns?: Yes Patient Questions/Concerns:: Patient is not sleeping, states she is waking up coughing and crying uncontrollably. She did confirm she is taking the Tussionex BID, but doesn't feel like it is helping. I advised patient I would send a message to PCP.  Also advised patient to drink plenty of fluids to stay hydrated and if she was unable to eat to drink a supplement drink such as Boost- patient agreed/verbalized understanding. Patient Questions/Concerns Addressed: Notified Provider of Patient Questions/Concerns  Items Reviewed: Did you receive and understand the discharge instructions provided?: Yes Medications obtained,verified, and reconciled?: Yes (Medications Reviewed) Any new allergies since your discharge?: No Dietary orders reviewed?: NA Do you have support at home?: No (patient states her daughter is calling and checking on her)  Medications Reviewed Today: Medications Reviewed Today     Reviewed by Leigh Aurora, CMA (Certified Medical Assistant) on 07/23/22 at [phone]  Med List Status: <None>   Medication Order Taking? Sig Documenting Provider Last Dose Status Informant  albuterol (VENTOLIN HFA) 108 (90 Base) MCG/ACT inhaler [id number] No INHALE 2 PUFFS BY MOUTH EVERY 4 HOURS AS NEEDED Loyola Mast, MD Past Month Active Self  aspirin 81 MG chewable tablet [id number] No Chew 81 mg by mouth daily. [provider] Past Week Active Self  Biotin 3 MG  TABS [id number] No Take by mouth. [provider] Past Week Active Self  buPROPion (WELLBUTRIN SR) 150 MG 12 hr tablet [id number] No TAKE 1 TABLET(150 MG) BY MOUTH TWICE DAILY  Patient taking differently: Take 150 mg by mouth 2 (two) times daily.   Loyola Mast, MD 07/18/2022 Active Self  chlorpheniramine-HYDROcodone (TUSSIONEX) 10-8 MG/5ML [id number]  Take 5 mLs by mouth every 12 (twelve) hours as needed for cough. Jerald Kief, MD  Active   cholecalciferol (VITAMIN D3) 25 MCG (1000 UNIT) tablet [id number] No  [provider] Past Week Active Self  Cranberry 1000 MG CAPS [id number] No Take by mouth. [provider] Past Week Active Self  dexamethasone (DECADRON) 6 MG tablet [id number]  Take 1 tablet (6 mg total) by mouth daily for 7 days. Jerald Kief, MD  Active   diazepam (VALIUM) 5 MG tablet [id number] No TAKE 1/2 TO 1 TABLET(2.5 TO 5 MG) BY MOUTH IN THE MORNING AND AT BEDTIME. MAY TAKE AN ADDITIONAL DOSE DAILY AS NEEDED Loyola Mast, MD Past Month Active Self   Discontinued 06/14/20 0858   fluticasone (FLONASE) 50 MCG/ACT nasal spray [id number]  Place 1 spray into both nostrils daily. Jerald Kief, MD  Active   levothyroxine (SYNTHROID) 75 MCG tablet [id number]  Take 1 tablet (75 mcg total) by mouth daily at 6 (six) AM. Jerald Kief, MD  Active   metoprolol succinate (TOPROL-XL) 25 MG 24 hr tablet [id number] No Take 1 tablet (25 mg total) by mouth daily.  Patient taking differently: Take 25 mg by mouth at bedtime.   Loyola Mast, MD 07/18/2022 20:00 Active Self  ondansetron (ZOFRAN) 4 MG tablet [id number] No Take 1 tablet (4 mg total) by mouth  every 8 (eight) hours as needed for nausea or vomiting. Loyola Mast, MD Past Month Active Self  rOPINIRole (REQUIP) 0.5 MG tablet [id number] No Take 1 tablet (0.5 mg total) by mouth at bedtime. Loyola Mast, MD Past Month Active Self            Home Care and Equipment/Supplies: Were Home Health Services  Ordered?: NA Any new equipment or medical supplies ordered?: NA  Functional Questionnaire: Do you need assistance with bathing/showering or dressing?: No Do you need assistance with meal preparation?: No Do you need assistance with eating?: No Do you have difficulty maintaining continence: No Do you need assistance with getting out of bed/getting out of a chair/moving?: No Do you have difficulty managing or taking your medications?: No  Follow up appointments reviewed: PCP Follow-up appointment confirmed?: Yes Date of PCP follow-up appointment?: 07/25/22 Follow-up Provider: Dr. Veto Kemps Specialist [facility] Follow-up appointment confirmed?: NA Do you need transportation to your follow-up appointment?: No Do you understand care options if your condition(s) worsen?: Yes-patient verbalized understanding    SIGNATURE Agnes Lawrence, CMA (AAMA)  [facility] [phone]

## 2022-07-24 ENCOUNTER — Telehealth: Payer: Self-pay

## 2022-07-24 NOTE — Telephone Encounter (Signed)
Called patient to follow up after yesterdays TOC call. Patient is in much better spirits and says she is feeling better today. She states her cough is still persistent and she is tired but she is making sure to drink plenty of fluids. Patient confirmed she will come in tomorrow for hospital follow up and call the office if she needs anything before then.   Kimberly [name], CMA (AAMA)  [facility] [phone]

## 2022-07-24 NOTE — Telephone Encounter (Signed)
Called patient to see how she was feeling.  She feels better than Tuesday (5/21) but still has a persistent cough.  She has a follow-up visit with Dr. Veto Kemps 5/23.

## 2022-07-25 ENCOUNTER — Ambulatory Visit (INDEPENDENT_AMBULATORY_CARE_PROVIDER_SITE_OTHER): Payer: PPO | Admitting: Family Medicine

## 2022-07-25 ENCOUNTER — Encounter: Payer: Self-pay | Admitting: Family Medicine

## 2022-07-25 VITALS — BP 122/70 | HR 78 | Temp 97.0°F | Ht 61.0 in | Wt 140.2 lb

## 2022-07-25 DIAGNOSIS — U071 COVID-19: Secondary | ICD-10-CM

## 2022-07-25 DIAGNOSIS — E039 Hypothyroidism, unspecified: Secondary | ICD-10-CM | POA: Diagnosis not present

## 2022-07-25 MED ORDER — LEVOTHYROXINE SODIUM 100 MCG PO TABS
100.0000 ug | ORAL_TABLET | Freq: Every day | ORAL | 3 refills | Status: DC
Start: 2022-07-25 — End: 2022-10-22

## 2022-07-25 NOTE — Patient Instructions (Signed)
Keep appointment for 6/11, if not feeling better. If feeling improvement, plan to cancel that one and keep appointment ~ 6/20.

## 2022-07-25 NOTE — Assessment & Plan Note (Signed)
I do not agree with the decision to lower the levothyroxine dose based on a TSH obtained while the patient was significantly ill. I will have her continue to take 100 mcg daily (as she had decided to do anyway) and we will reassess her TSH in 4 weeks.

## 2022-07-25 NOTE — Assessment & Plan Note (Signed)
Oxygen sat is normal today. She is no longer tachycardic. It appears Kimberly [name] is recovering well. I  recommend she complete her steroid course. I see no indication for antibiotics at this point. She should keep her appointment for 6/11, if not feeling better. If feeling improvement, plan to cancel that one and keep appointment ~ 6/20.

## 2022-07-25 NOTE — Progress Notes (Signed)
[facility] [address] [place] [address] Dept: [phone] Dept Fax: [phone]  Office Visit  Subjective:    Patient ID: Kimberly [name], female    DOB: [birth year], 72 y.o..   MRN: [id number]  Chief Complaint  Patient presents with   Hospitalization Follow-up    Hospital f/u, feeling a little bit better.     History of Present Illness:  Patient is in today for follow-up from recent hospitalization. Kimberly [name] was admitted at [facility] form 5/17-5/20/2024 due to COVID-19 and associated acute respiratory failure with hypoxia. She had developed COVID around 5/10 with associated hoarseness and profound fatigue. Over the next week, she had progressive cough and shortness of breath develop. Her oxygen sat was down to 88% on room air. She was admitted and primarily treated with oxygen support and steroids. She had completed a course of Paxlovid. Since returning home, she is improving, but certainly not well yet. She continues on Decadron. She finds Tussionex is helpful, but she is being very cautious about using this.  Kimberly [name] has a history of hypothyroidism. During her hospital stay, she was found to have a TSH of 0.123. Here levothyroxine dose was reduced to 75 mcg daily. Kimberly [name] notes she has not actually been taking the lower dose as she wanted to discuss this with me prior to making a change.  Past Medical History: Patient Active Problem List   Diagnosis Date Noted   COVID-19 07/20/2022   Acute respiratory failure with hypoxia (HCC) 07/19/2022   Asthma exacerbation 07/19/2022   COVID-19 virus infection 07/12/2022   Incontinence of feces with fecal urgency 03/23/2021   SUI (stress urinary incontinence, female) 03/23/2021   Overweight (BMI 25.0-29.9) 10/12/2020   Hiatal hernia 09/22/2020   GERD (gastroesophageal reflux disease) 09/22/2020   Diverticulosis 09/22/2020   Osteopenia 09/08/2020   Restless leg syndrome  06/05/2020   Hypothyroidism 03/29/2020   Essential hypertension 03/29/2020   Kidney stones 03/29/2020   Asthma 03/29/2020   Anxiety disorder 03/29/2020   Past Surgical History:  Procedure Laterality Date   ANTERIOR AND POSTERIOR VAGINAL REPAIR W/ SACROSPINOUS LIGAMENT SUSPENSION  06/29/2021   PARTIAL HYSTERECTOMY  1980   Pre-cancerous condition. Failed laser surgery. still have ovaries   UMBILICAL HERNIA REPAIR     with mesh   WRIST SURGERY Left 2019   Family History  Problem Relation Age of Onset   Diabetes Mother    Breast cancer Mother    Colon cancer Father    Kidney failure Father    COPD Brother    Diabetes Brother    Heart disease Brother    Heart disease Maternal Uncle    Heart disease Daughter    Depression Son    Esophageal cancer Neg Hx    Stomach cancer Neg Hx    Rectal cancer Neg Hx    Outpatient Medications Prior to Visit  Medication Sig Dispense Refill   albuterol (VENTOLIN HFA) 108 (90 Base) MCG/ACT inhaler INHALE 2 PUFFS BY MOUTH EVERY 4 HOURS AS NEEDED 8.5 g 3   aspirin 81 MG chewable tablet Chew 81 mg by mouth daily.     Biotin 3 MG TABS Take by mouth.     buPROPion (WELLBUTRIN SR) 150 MG 12 hr tablet TAKE 1 TABLET(150 MG) BY MOUTH TWICE DAILY (Patient taking differently: Take 150 mg by mouth 2 (two) times daily.) 180 tablet 3   chlorpheniramine-HYDROcodone (TUSSIONEX) 10-8 MG/5ML Take 5 mLs by mouth every 12 (twelve) hours as  needed for cough. 70 mL 0   cholecalciferol (VITAMIN D3) 25 MCG (1000 UNIT) tablet      Cranberry 1000 MG CAPS Take by mouth.     dexamethasone (DECADRON) 6 MG tablet Take 1 tablet (6 mg total) by mouth daily for 7 days. 7 tablet 0   diazepam (VALIUM) 5 MG tablet TAKE 1/2 TO 1 TABLET(2.5 TO 5 MG) BY MOUTH IN THE MORNING AND AT BEDTIME. MAY TAKE AN ADDITIONAL DOSE DAILY AS NEEDED 60 tablet 1   fluticasone (FLONASE) 50 MCG/ACT nasal spray Place 1 spray into both nostrils daily.     metoprolol succinate (TOPROL-XL) 25 MG 24 hr tablet  Take 1 tablet (25 mg total) by mouth daily. (Patient taking differently: Take 25 mg by mouth at bedtime.) 90 tablet 3   ondansetron (ZOFRAN) 4 MG tablet Take 1 tablet (4 mg total) by mouth every 8 (eight) hours as needed for nausea or vomiting. 20 tablet 0   rOPINIRole (REQUIP) 0.5 MG tablet Take 1 tablet (0.5 mg total) by mouth at bedtime. 30 tablet 5   levothyroxine (SYNTHROID) 75 MCG tablet Take 1 tablet (75 mcg total) by mouth daily at 6 (six) AM. 30 tablet 0   No facility-administered medications prior to visit.   Allergies  Allergen Reactions   Codeine Hives   Sulfa Antibiotics Hives and Itching     Objective:   Today's Vitals   07/25/22 1045  BP: 122/70  Pulse: 78  Temp: (!) 97 F (36.1 C)  TempSrc: Temporal  SpO2: 97%  Weight: 140 lb 3.2 oz (63.6 kg)  Height: 5\' 1"  (1.549 m)   Body mass index is 26.49 kg/m.   General: Well developed, well nourished. No acute distress. Lungs: Clear to auscultation bilaterally. No wheezing, rales or rhonchi. Psych: Alert and oriented. Normal mood and affect.  Health Maintenance Due  Topic Date Due   Hepatitis C Screening  Never done   Zoster Vaccines- Shingrix (1 of 2) Never done     Assessment & Plan:   Problem List Items Addressed This Visit       Endocrine   Hypothyroidism    I do not agree with the decision to lower the levothyroxine dose based on a TSH obtained while the patient was significantly ill. I will have her continue to take 100 mcg daily (as she had decided to do anyway) and we will reassess her TSH in 4 weeks.      Relevant Medications   levothyroxine (SYNTHROID) 100 MCG tablet     Other   COVID-19 - Primary    Oxygen sat is normal today. She is no longer tachycardic. It appears Kimberly [name] is recovering well. I  recommend she complete her steroid course. I see no indication for antibiotics at this point. She should keep her appointment for 6/11, if not feeling better. If feeling improvement, plan to cancel  that one and keep appointment ~ 6/20.       Return in about 4 weeks (around 08/22/2022).   Loyola Mast, MD

## 2022-07-30 ENCOUNTER — Encounter: Payer: Self-pay | Admitting: Family Medicine

## 2022-08-02 ENCOUNTER — Encounter: Payer: Self-pay | Admitting: Family Medicine

## 2022-08-02 MED ORDER — HYDROCOD POLI-CHLORPHE POLI ER 10-8 MG/5ML PO SUER: 5.0000 mL | Freq: Two times a day (BID) | ORAL | 0 refills | Status: DC | PRN

## 2022-08-13 ENCOUNTER — Ambulatory Visit (INDEPENDENT_AMBULATORY_CARE_PROVIDER_SITE_OTHER): Payer: PPO

## 2022-08-13 ENCOUNTER — Encounter: Payer: Self-pay | Admitting: Family Medicine

## 2022-08-13 ENCOUNTER — Ambulatory Visit (INDEPENDENT_AMBULATORY_CARE_PROVIDER_SITE_OTHER): Payer: PPO | Admitting: Family Medicine

## 2022-08-13 VITALS — BP 130/80 | HR 76 | Temp 98.0°F | Ht 61.0 in | Wt 141.4 lb

## 2022-08-13 DIAGNOSIS — R053 Chronic cough: Secondary | ICD-10-CM

## 2022-08-13 DIAGNOSIS — U099 Post covid-19 condition, unspecified: Secondary | ICD-10-CM

## 2022-08-13 LAB — CBC WITH DIFFERENTIAL/PLATELET
Basophils Absolute: 0 10*3/uL (ref 0.0–0.1)
Basophils Relative: 0.3 % (ref 0.0–3.0)
Eosinophils Absolute: 0.3 10*3/uL (ref 0.0–0.7)
Eosinophils Relative: 5.8 % — ABNORMAL HIGH (ref 0.0–5.0)
HCT: 37.8 % (ref 36.0–46.0)
Hemoglobin: 12.4 g/dL (ref 12.0–15.0)
Lymphocytes Relative: 40.9 % (ref 12.0–46.0)
Lymphs Abs: 2.3 10*3/uL (ref 0.7–4.0)
MCHC: 32.7 g/dL (ref 30.0–36.0)
MCV: 86 fl (ref 78.0–100.0)
Monocytes Absolute: 0.5 10*3/uL (ref 0.1–1.0)
Monocytes Relative: 9.4 % (ref 3.0–12.0)
Neutro Abs: 2.4 10*3/uL (ref 1.4–7.7)
Neutrophils Relative %: 43.6 % (ref 43.0–77.0)
Platelets: 268 10*3/uL (ref 150.0–400.0)
RBC: 4.39 Mil/uL (ref 3.87–5.11)
RDW: 15 % (ref 11.5–15.5)
WBC: 5.6 10*3/uL (ref 4.0–10.5)

## 2022-08-13 MED ORDER — HYDROCOD POLI-CHLORPHE POLI ER 10-8 MG/5ML PO SUER
5.0000 mL | Freq: Two times a day (BID) | ORAL | 0 refills | Status: DC | PRN
Start: 2022-08-13 — End: 2022-08-30

## 2022-08-13 NOTE — Progress Notes (Signed)
[facility] [address] [place] [address] Dept: [phone] Dept Fax: [phone]  Office Visit  Subjective:    Patient ID: Kimberly [name], female    DOB: 1950/03/30, 72 y.o..   MRN: [id number]  Chief Complaint  Patient presents with   Medical Management of Chronic Issues    F/u. C/o still having a cough with pain around rib/back, rough skin on Rt side neck and trouble standing more than 5 minutes due to SOB.   History of Present Illness:  Patient is in today for reassessment of her cough. Kimberly [name] was admitted at [facility] from 5/17-5/20/2024 due to COVID-19 and associated acute respiratory failure with hypoxia. She had developed COVID around 5/10 with associated hoarseness and profound fatigue. Over the next week, she had progressive cough and shortness of breath develop. Her oxygen sat was down to 88% on room air. She was admitted and primarily treated with oxygen support and steroids. She had completed a course of Paxlovid. After discharge, she completed a course of Decadron. She finds her cough continues to bother her. She had been feeling improved, but now notes she is feeling a bit worse again. She does have chest wall pain associated with her cough and breathing. She has tried to be very cautious about her use of Tussionex, but wonders about what else she can do for pain. She also admits to some dyspnea at times.  Past Medical History: Patient Active Problem List   Diagnosis Date Noted   COVID-19 07/20/2022   Acute respiratory failure with hypoxia (HCC) 07/19/2022   Asthma exacerbation 07/19/2022   Incontinence of feces with fecal urgency 03/23/2021   SUI (stress urinary incontinence, female) 03/23/2021   Overweight (BMI 25.0-29.9) 10/12/2020   Hiatal hernia 09/22/2020   GERD (gastroesophageal reflux disease) 09/22/2020   Diverticulosis 09/22/2020   Osteopenia 09/08/2020   Restless leg syndrome 06/05/2020    Hypothyroidism 03/29/2020   Essential hypertension 03/29/2020   Kidney stones 03/29/2020   Asthma 03/29/2020   Anxiety disorder 03/29/2020   Past Surgical History:  Procedure Laterality Date   ANTERIOR AND POSTERIOR VAGINAL REPAIR W/ SACROSPINOUS LIGAMENT SUSPENSION  06/29/2021   PARTIAL HYSTERECTOMY  1980   Pre-cancerous condition. Failed laser surgery. still have ovaries   UMBILICAL HERNIA REPAIR     with mesh   WRIST SURGERY Left 2019   Family History  Problem Relation Age of Onset   Diabetes Mother    Breast cancer Mother    Colon cancer Father    Kidney failure Father    COPD Brother    Diabetes Brother    Heart disease Brother    Heart disease Maternal Uncle    Heart disease Daughter    Depression Son    Esophageal cancer Neg Hx    Stomach cancer Neg Hx    Rectal cancer Neg Hx    Outpatient Medications Prior to Visit  Medication Sig Dispense Refill   albuterol (VENTOLIN HFA) 108 (90 Base) MCG/ACT inhaler INHALE 2 PUFFS BY MOUTH EVERY 4 HOURS AS NEEDED 8.5 g 3   aspirin 81 MG chewable tablet Chew 81 mg by mouth daily.     Biotin 3 MG TABS Take by mouth.     buPROPion (WELLBUTRIN SR) 150 MG 12 hr tablet TAKE 1 TABLET(150 MG) BY MOUTH TWICE DAILY (Patient taking differently: Take 150 mg by mouth 2 (two) times daily.) 180 tablet 3   chlorpheniramine-HYDROcodone (TUSSIONEX) 10-8 MG/5ML Take 5 mLs by mouth every 12 (  twelve) hours as needed for cough. 70 mL 0   cholecalciferol (VITAMIN D3) 25 MCG (1000 UNIT) tablet      Cranberry 1000 MG CAPS Take by mouth.     diazepam (VALIUM) 5 MG tablet TAKE 1/2 TO 1 TABLET(2.5 TO 5 MG) BY MOUTH IN THE MORNING AND AT BEDTIME. MAY TAKE AN ADDITIONAL DOSE DAILY AS NEEDED 60 tablet 1   fluticasone (FLONASE) 50 MCG/ACT nasal spray Place 1 spray into both nostrils daily.     levothyroxine (SYNTHROID) 100 MCG tablet Take 1 tablet (100 mcg total) by mouth daily at 6 (six) AM. 90 tablet 3   metoprolol succinate (TOPROL-XL) 25 MG 24 hr tablet  Take 1 tablet (25 mg total) by mouth daily. (Patient taking differently: Take 25 mg by mouth at bedtime.) 90 tablet 3   ondansetron (ZOFRAN) 4 MG tablet Take 1 tablet (4 mg total) by mouth every 8 (eight) hours as needed for nausea or vomiting. 20 tablet 0   rOPINIRole (REQUIP) 0.5 MG tablet Take 1 tablet (0.5 mg total) by mouth at bedtime. 30 tablet 5   No facility-administered medications prior to visit.   Allergies  Allergen Reactions   Codeine Hives   Sulfa Antibiotics Hives and Itching     Objective:   Today's Vitals   08/13/22 1255  BP: 130/80  Pulse: 76  Temp: 98 F (36.7 C)  TempSrc: Temporal  SpO2: 94%  Weight: 141 lb 6.4 oz (64.1 kg)  Height: 5\' 1"  (1.549 m)   Body mass index is 26.72 kg/m.   General: Well developed, well nourished. No acute distress. Lungs: Bibasilar rales without wheezing or rhonchi. Psych: Alert and oriented. Normal mood and affect. Occasionally tearful.  Health Maintenance Due  Topic Date Due   Hepatitis C Screening  Never done     Assessment & Plan:   Problem List Items Addressed This Visit       Other   Post-COVID chronic cough - Primary    I am concerned about the mild hypoxia Kimberly [name] is showing. This may represent long-COVID. I will check a CBC and chest-x-ray to rule out secondary pneumonia. I will refer her to pulmonary for further evaluation. I will refill her Tussionex for cough.      Relevant Medications   chlorpheniramine-HYDROcodone (TUSSIONEX) 10-8 MG/5ML   Other Relevant Orders   CBC with Differential/Platelet   DG Chest 2 View   Ambulatory referral to Pulmonology    Return for after seen by pulmonology.Loyola Mast, MD

## 2022-08-13 NOTE — Assessment & Plan Note (Signed)
I am concerned about the mild hypoxia Kimberly [name] is showing. This may represent long-COVID. I will check a CBC and chest-x-ray to rule out secondary pneumonia. I will refer her to pulmonary for further evaluation. I will refill her Tussionex for cough.

## 2022-08-14 ENCOUNTER — Encounter: Payer: Self-pay | Admitting: Family Medicine

## 2022-08-22 ENCOUNTER — Ambulatory Visit: Payer: PPO | Admitting: Family Medicine

## 2022-08-29 ENCOUNTER — Ambulatory Visit (HOSPITAL_BASED_OUTPATIENT_CLINIC_OR_DEPARTMENT_OTHER): Payer: PPO | Admitting: Pulmonary Disease

## 2022-08-29 ENCOUNTER — Encounter: Payer: Self-pay | Admitting: Family Medicine

## 2022-08-29 ENCOUNTER — Encounter (HOSPITAL_BASED_OUTPATIENT_CLINIC_OR_DEPARTMENT_OTHER): Payer: Self-pay | Admitting: Pulmonary Disease

## 2022-08-29 VITALS — BP 154/88 | HR 89 | Temp 97.8°F | Ht 61.0 in | Wt 142.6 lb

## 2022-08-29 DIAGNOSIS — R053 Chronic cough: Secondary | ICD-10-CM

## 2022-08-29 DIAGNOSIS — J45909 Unspecified asthma, uncomplicated: Secondary | ICD-10-CM

## 2022-08-29 DIAGNOSIS — U099 Post covid-19 condition, unspecified: Secondary | ICD-10-CM

## 2022-08-29 MED ORDER — FLUTICASONE-SALMETEROL 115-21 MCG/ACT IN AERO: 2.0000 | INHALATION_SPRAY | Freq: Two times a day (BID) | RESPIRATORY_TRACT | 0 refills | Status: DC

## 2022-08-29 NOTE — Progress Notes (Signed)
Subjective:   PATIENT ID: Kimberly [name] GENDER: female DOB: [birth year], MRN: [id number]  Chief Complaint  Patient presents with   Follow-up    Reason for Visit: New consult for covid hospital follow-up  Kimberly [name] is 72 year old female former smoker with asthmatic bronchitis, HTN, hypothyroidism, barrett esophagus, GERD, hx uterine cancer s/p partial hysterectomy, RLS who presents as a new consult.  Recent hospitalization for COVID from 07/19/22-07/22/22. Treated with decadron. Her cough has improved but still having two episodes lasting 15 min a day associated with thick sputum. Some wheezing associated. Has shortness of breath with long distances. Worse with heat. Improves with air conditioning and tussionex. Has had chest pain post cough that is significant  She reports longstanding history of asthmatic bronchitis/asthma since childhood. Hx of hives.Reports annual URI. Never had a hospitalization related to lung issues until this year. [place] for improvement.  Social History: Former smoker. Quit in 1995/1996 Retired Research officer, [employer] business Drinks 4 glasses a month  I have personally reviewed patient's past medical/family/social history, allergies, current medications.  Past Medical History:  Diagnosis Date   Anxiety    Asthma    Barrett esophagus    Chronic headaches    Colon polyps    COVID 03/2020   Depression    GERD (gastroesophageal reflux disease)    Hypertension    Kidney stones    Pneumonia    Thyroid disease    Uterine cancer (HCC)      Family History  Problem Relation Age of Onset   Diabetes Mother    Breast cancer Mother    Colon cancer Father    Kidney failure Father    COPD Brother    Diabetes Brother    Heart disease Brother    Heart disease Maternal Uncle    Heart disease Daughter    Depression Son    Esophageal cancer Neg Hx    Stomach cancer Neg Hx    Rectal cancer Neg Hx      Social History   Occupational  History   Occupation: retired    Comment: Retired  Tobacco Use   Smoking status: Former    Types: Cigarettes    Quit date: 1995    Years since quitting: 29.5   Smokeless tobacco: Never  Vaping Use   Vaping Use: Never used  Substance and Sexual Activity   Alcohol use: Yes    Alcohol/week: 3.0 standard drinks of alcohol    Types: 3 Standard drinks or equivalent per week    Comment: 1 per day   Drug use: Never   Sexual activity: Not Currently    Birth control/protection: Surgical    Comment: Hyst    Allergies  Allergen Reactions   Codeine Hives   Sulfa Antibiotics Hives and Itching     Outpatient Medications Prior to Visit  Medication Sig Dispense Refill   albuterol (VENTOLIN HFA) 108 (90 Base) MCG/ACT inhaler INHALE 2 PUFFS BY MOUTH EVERY 4 HOURS AS NEEDED 8.5 g 3   aspirin 81 MG chewable tablet Chew 81 mg by mouth daily.     Biotin 3 MG TABS Take by mouth.     buPROPion (WELLBUTRIN SR) 150 MG 12 hr tablet TAKE 1 TABLET(150 MG) BY MOUTH TWICE DAILY (Patient taking differently: Take 150 mg by mouth 2 (two) times daily.) 180 tablet 3   chlorpheniramine-HYDROcodone (TUSSIONEX) 10-8 MG/5ML Take 5 mLs by mouth every 12 (twelve) hours as needed for cough. 70 mL  0   cholecalciferol (VITAMIN D3) 25 MCG (1000 UNIT) tablet      Cranberry 1000 MG CAPS Take by mouth.     diazepam (VALIUM) 5 MG tablet TAKE 1/2 TO 1 TABLET(2.5 TO 5 MG) BY MOUTH IN THE MORNING AND AT BEDTIME. MAY TAKE AN ADDITIONAL DOSE DAILY AS NEEDED 60 tablet 1   fluticasone (FLONASE) 50 MCG/ACT nasal spray Place 1 spray into both nostrils daily.     levothyroxine (SYNTHROID) 100 MCG tablet Take 1 tablet (100 mcg total) by mouth daily at 6 (six) AM. 90 tablet 3   metoprolol succinate (TOPROL-XL) 25 MG 24 hr tablet Take 1 tablet (25 mg total) by mouth daily. (Patient taking differently: Take 25 mg by mouth at bedtime.) 90 tablet 3   ondansetron (ZOFRAN) 4 MG tablet Take 1 tablet (4 mg total) by mouth every 8 (eight) hours  as needed for nausea or vomiting. 20 tablet 0   rOPINIRole (REQUIP) 0.5 MG tablet Take 1 tablet (0.5 mg total) by mouth at bedtime. 30 tablet 5   No facility-administered medications prior to visit.    Review of Systems  Constitutional:  Negative for chills, diaphoresis, fever, malaise/fatigue and weight loss.  HENT:  Positive for sore throat. Negative for congestion.   Respiratory:  Positive for cough, sputum production, shortness of breath and wheezing. Negative for hemoptysis.   Cardiovascular:  Negative for chest pain, palpitations and leg swelling.  Psychiatric/Behavioral:  The patient is nervous/anxious.      Objective:   Vitals:   08/29/22 1545  BP: (!) 154/88  Pulse: 89  Temp: 97.8 F (36.6 C)  TempSrc: Oral  SpO2: 94%  Weight: 142 lb 9.6 oz (64.7 kg)  Height: 5\' 1"  (1.549 m)   SpO2: 94 % O2 Device: None (Room air)  Physical Exam: General: Well-appearing, no acute distress HENT: [place], AT Eyes: EOMI, no scleral icterus Respiratory: Clear to auscultation bilaterally.  No crackles, wheezing or rales Cardiovascular: RRR, -M/R/G, no JVD Extremities:-Edema,-tenderness Neuro: AAO x4, CNII-XII grossly intact Psych: Normal mood, normal affect  Data Reviewed:  Imaging: CXR 08/13/22 - No infiltrate of effusion or edema  PFT: None on file  Labs: CBC    Component Value Date/Time   WBC 5.6 08/13/2022 1331   RBC 4.39 08/13/2022 1331   HGB 12.4 08/13/2022 1331   HCT 37.8 08/13/2022 1331   PLT 268.0 08/13/2022 1331   MCV 86.0 08/13/2022 1331   MCH 28.4 07/20/2022 0557   MCHC 32.7 08/13/2022 1331   RDW 15.0 08/13/2022 1331   LYMPHSABS 2.3 08/13/2022 1331   MONOABS 0.5 08/13/2022 1331   EOSABS 0.3 08/13/2022 1331   BASOSABS 0.0 08/13/2022 1331        Assessment & Plan:   Discussion: 72 year old female former smoker with asthmatic bronchitis, HTN, hypothyroidism, barrett esophagus, GERD, hx uterine cancer s/p partial hysterectomy, RLS who presents as a new  consult. Symptoms consistent with asthmatic bronchitis likely exacerbated by recent covid infection. Hospital course reviewed. We reviewed clinical course of COVID-19 including long-term complications including post-inflammatory lung disease and long hauler symptoms.   Asthmatic bronchitis Post- covid bronchitis --Will provide AirSupra sample. Take TWO puffs in the morning and evening. Take this until you run out  Addendum: Post-clinic noted that no samples were available. --START Advair TWO puffs in the morning and evening. Rinse mouth out after use.  Health Maintenance Immunization History  Administered Date(s) Administered   Moderna Covid-19 Vaccine Bivalent Booster 41yrs & up 12/26/2020  PFIZER Comirnaty(Gray Top)Covid-19 Tri-Sucrose Vaccine 05/12/2019, 06/13/2019   Td 03/04/2016   CT Lung Screen - not qualified. Quit >15 years ago  No orders of the defined types were placed in this encounter.  Meds ordered this encounter  Medications   fluticasone-salmeterol (ADVAIR HFA) 115-21 MCG/ACT inhaler    Sig: Inhale 2 puffs into the lungs 2 (two) times daily.    Dispense:  1 each    Refill:  0    Return in about 2 months (around 10/29/2022).  I have spent a total time of 45-minutes on the day of the appointment reviewing prior documentation, coordinating care and discussing medical diagnosis and plan with the patient/family. Imaging, labs and tests included in this note have been reviewed and interpreted independently by me.  Laberta Wilbon Mechele Collin, MD [facility] 08/29/2022 4:52 PM  Office Number [phone]

## 2022-08-29 NOTE — Patient Instructions (Signed)
Asthmatic bronchitis Post- covid bronchitis --Will provide AirSupra sample. Take TWO puffs in the morning and evening. Take this until you run out --START Advair TWO puffs in the morning and evening. Rinse mouth out after use.

## 2022-08-30 MED ORDER — HYDROCOD POLI-CHLORPHE POLI ER 10-8 MG/5ML PO SUER
5.0000 mL | Freq: Two times a day (BID) | ORAL | 0 refills | Status: AC | PRN
Start: 2022-08-30 — End: ?

## 2022-09-03 ENCOUNTER — Other Ambulatory Visit: Payer: Self-pay | Admitting: Family Medicine

## 2022-09-03 ENCOUNTER — Encounter: Payer: Self-pay | Admitting: Family Medicine

## 2022-09-03 DIAGNOSIS — J4520 Mild intermittent asthma, uncomplicated: Secondary | ICD-10-CM

## 2022-09-04 MED ORDER — ALBUTEROL SULFATE HFA 108 (90 BASE) MCG/ACT IN AERS
INHALATION_SPRAY | RESPIRATORY_TRACT | 3 refills | Status: AC
Start: 2022-09-04 — End: ?

## 2022-09-11 ENCOUNTER — Institutional Professional Consult (permissible substitution) (HOSPITAL_BASED_OUTPATIENT_CLINIC_OR_DEPARTMENT_OTHER): Payer: PPO | Admitting: Pulmonary Disease

## 2022-09-12 ENCOUNTER — Encounter (HOSPITAL_BASED_OUTPATIENT_CLINIC_OR_DEPARTMENT_OTHER): Payer: Self-pay | Admitting: Pulmonary Disease

## 2022-09-13 ENCOUNTER — Other Ambulatory Visit (HOSPITAL_COMMUNITY): Payer: Self-pay

## 2022-09-13 MED ORDER — AIRSUPRA 90-80 MCG/ACT IN AERO: 2.0000 | INHALATION_SPRAY | Freq: Two times a day (BID) | RESPIRATORY_TRACT | Status: DC

## 2022-09-13 NOTE — Telephone Encounter (Signed)
Contacted patient letter her know we do have samples and patient is planning on coming Monday if not on Tuesday.

## 2022-09-13 NOTE — Telephone Encounter (Signed)
Please contact patient to pick up sample of Airsupra today

## 2022-09-16 ENCOUNTER — Other Ambulatory Visit: Payer: Self-pay | Admitting: Family Medicine

## 2022-09-16 DIAGNOSIS — F411 Generalized anxiety disorder: Secondary | ICD-10-CM

## 2022-09-16 DIAGNOSIS — G2581 Restless legs syndrome: Secondary | ICD-10-CM

## 2022-09-18 ENCOUNTER — Other Ambulatory Visit: Payer: Self-pay | Admitting: Family Medicine

## 2022-10-14 ENCOUNTER — Other Ambulatory Visit: Payer: Self-pay | Admitting: Family Medicine

## 2022-10-14 DIAGNOSIS — F411 Generalized anxiety disorder: Secondary | ICD-10-CM

## 2022-10-21 ENCOUNTER — Encounter: Payer: Self-pay | Admitting: Family Medicine

## 2022-10-21 ENCOUNTER — Ambulatory Visit (HOSPITAL_BASED_OUTPATIENT_CLINIC_OR_DEPARTMENT_OTHER)
Admission: RE | Admit: 2022-10-21 | Discharge: 2022-10-21 | Disposition: A | Discharge status: Home / Self Care | Payer: PPO | Source: Ambulatory Visit | Attending: Family Medicine | Admitting: Family Medicine

## 2022-10-21 ENCOUNTER — Ambulatory Visit (INDEPENDENT_AMBULATORY_CARE_PROVIDER_SITE_OTHER): Payer: PPO | Admitting: Family Medicine

## 2022-10-21 VITALS — BP 126/80 | HR 83 | Temp 98.0°F | Ht 61.0 in | Wt 141.8 lb

## 2022-10-21 DIAGNOSIS — E039 Hypothyroidism, unspecified: Secondary | ICD-10-CM | POA: Diagnosis not present

## 2022-10-21 DIAGNOSIS — K8020 Calculus of gallbladder without cholecystitis without obstruction: Secondary | ICD-10-CM | POA: Diagnosis not present

## 2022-10-21 DIAGNOSIS — R1011 Right upper quadrant pain: Secondary | ICD-10-CM

## 2022-10-21 LAB — COMPREHENSIVE METABOLIC PANEL
ALT: 10 U/L (ref 0–35)
AST: 16 U/L (ref 0–37)
Albumin: 4.4 g/dL (ref 3.5–5.2)
Alkaline Phosphatase: 63 U/L (ref 39–117)
BUN: 14 mg/dL (ref 6–23)
CO2: 31 mEq/L (ref 19–32)
Calcium: 9.7 mg/dL (ref 8.4–10.5)
Chloride: 104 mEq/L (ref 96–112)
Creatinine, Ser: 0.6 mg/dL (ref 0.40–1.20)
GFR: 90.61 mL/min (ref >60.00)
Glucose, Bld: 99 mg/dL (ref 70–99)
Potassium: 4.6 mEq/L (ref 3.5–5.1)
Sodium: 144 mEq/L (ref 135–145)
Total Bilirubin: 0.6 mg/dL (ref 0.2–1.2)
Total Protein: 6.5 g/dL (ref 6.0–8.3)

## 2022-10-21 LAB — CBC
HCT: 39.7 % (ref 36.0–46.0)
Hemoglobin: 13.1 g/dL (ref 12.0–15.0)
MCHC: 33 g/dL (ref 30.0–36.0)
MCV: 86.7 fl (ref 78.0–100.0)
Platelets: 190 10*3/uL (ref 150.0–400.0)
RBC: 4.58 Mil/uL (ref 3.87–5.11)
RDW: 13.6 % (ref 11.5–15.5)
WBC: 6.4 10*3/uL (ref 4.0–10.5)

## 2022-10-21 LAB — TSH: TSH: 0.11 u[IU]/mL — ABNORMAL LOW (ref 0.35–5.50)

## 2022-10-21 LAB — LIPASE: Lipase: 8 U/L — ABNORMAL LOW (ref 11.0–59.0)

## 2022-10-21 MED ORDER — TRAMADOL HCL 50 MG PO TABS
50.0000 mg | ORAL_TABLET | Freq: Three times a day (TID) | ORAL | 0 refills | Status: AC | PRN
Start: 2022-10-21 — End: 2022-10-26

## 2022-10-21 MED ORDER — ONDANSETRON 4 MG PO TBDP: 4.0000 mg | ORAL_TABLET | Freq: Three times a day (TID) | ORAL | 0 refills | Status: DC | PRN

## 2022-10-21 NOTE — Progress Notes (Addendum)
[facility] [address] [place] [address] Dept: [phone] Dept Fax: [phone]  Office Visit  Subjective:    Patient ID: Kimberly [name], female    DOB: 07-17-1950, 72 y.o..   MRN: [id number]  Chief Complaint  Patient presents with   Abdominal Pain    C/o having pain in the upper abdomen pain x 2-3 days.  N&V and explosive bowel movements. No OTC.    History of Present Illness:  Patient is in today with a 2-day history of abdominal pain, primarily in the RUQ. She has had associated nausea and vomiting. She also had some explosive diarrhea as well. She has been drinking water, but really not eating anything else. She has noted her stools are lighter than normal.  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   COVID-19 07/20/2022   Incontinence of feces with fecal urgency 03/23/2021   SUI (stress urinary incontinence, female) 03/23/2021   Overweight (BMI 25.0-29.9) 10/12/2020   Hiatal hernia 09/22/2020   GERD (gastroesophageal reflux disease) 09/22/2020   Diverticulosis 09/22/2020   Osteopenia 09/08/2020   Restless leg syndrome 06/05/2020   Hypothyroidism 03/29/2020   Essential hypertension 03/29/2020   Kidney stones 03/29/2020   Asthma 03/29/2020   Anxiety disorder 03/29/2020   Past Surgical History:  Procedure Laterality Date   ANTERIOR AND POSTERIOR VAGINAL REPAIR W/ SACROSPINOUS LIGAMENT SUSPENSION  06/29/2021   PARTIAL HYSTERECTOMY  1980   Pre-cancerous condition. Failed laser surgery. still have ovaries   UMBILICAL HERNIA REPAIR     with mesh   WRIST SURGERY Left 2019   Family History  Problem Relation Age of Onset   Diabetes Mother    Breast cancer Mother    Colon cancer Father    Kidney failure Father    COPD Brother    Diabetes Brother    Heart disease Brother    Heart disease Maternal Uncle    Heart disease Daughter     Depression Son    Esophageal cancer Neg Hx    Stomach cancer Neg Hx    Rectal cancer Neg Hx    Outpatient Medications Prior to Visit  Medication Sig Dispense Refill   albuterol (VENTOLIN HFA) 108 (90 Base) MCG/ACT inhaler INHALE 2 PUFFS BY MOUTH EVERY 4 HOURS AS NEEDED 8.5 g 3   Albuterol-Budesonide (AIRSUPRA) 90-80 MCG/ACT AERO Inhale 2 puffs into the lungs 2 (two) times daily.     aspirin 81 MG chewable tablet Chew 81 mg by mouth daily.     Biotin 3 MG TABS Take by mouth.     buPROPion (WELLBUTRIN SR) 150 MG 12 hr tablet TAKE 1 TABLET(150 MG) BY MOUTH TWICE DAILY 180 tablet 3   chlorpheniramine-HYDROcodone (TUSSIONEX) 10-8 MG/5ML Take 5 mLs by mouth every 12 (twelve) hours as needed for cough. 70 mL 0   cholecalciferol (VITAMIN D3) 25 MCG (1000 UNIT) tablet      Cranberry 1000 MG CAPS Take by mouth.     diazepam (VALIUM) 5 MG tablet TAKE 1/2 TO 1 TABLET(2.5 TO 5 MG) BY MOUTH IN THE MORNING AND AT BEDTIME. MAY TAKE AN ADDITIONAL DOSE DAILY AS NEEDED 60 tablet 1   fluticasone (FLONASE) 50 MCG/ACT nasal spray Place 1 spray into both nostrils daily.     fluticasone-salmeterol (ADVAIR HFA) 115-21 MCG/ACT inhaler Inhale 2 puffs into the lungs 2 (two) times daily. 1 each 0   levothyroxine (SYNTHROID) 100 MCG tablet Take  1 tablet (100 mcg total) by mouth daily at 6 (six) AM. 90 tablet 3   metoprolol succinate (TOPROL-XL) 25 MG 24 hr tablet Take 1 tablet (25 mg total) by mouth daily. (Patient taking differently: Take 25 mg by mouth at bedtime.) 90 tablet 3   rOPINIRole (REQUIP) 0.5 MG tablet TAKE 1 TABLET(0.5 MG) BY MOUTH AT BEDTIME 30 tablet 5   ondansetron (ZOFRAN) 4 MG tablet Take 1 tablet (4 mg total) by mouth every 8 (eight) hours as needed for nausea or vomiting. 20 tablet 0   No facility-administered medications prior to visit.   Allergies  Allergen Reactions   Codeine Hives   Sulfa Antibiotics Hives and Itching     Objective:   Today's Vitals   10/21/22 1103  BP: 126/80  Pulse: 83   Temp: 98 F (36.7 C)  TempSrc: Temporal  SpO2: 100%  Weight: 141 lb 12.8 oz (64.3 kg)  Height: 5\' 1"  (1.549 m)   Body mass index is 26.79 kg/m.   General: Well developed, well nourished. No acute distress. Abdomen: Soft. Bowel sounds positive, normal pitch but increased frequency. No hepatosplenomegaly. There is   tenderness in the RUQ, but some in the right mid and lower abdomen as well. + Murphy's sign. No rebound or   guarding. Psych: Alert and oriented. Normal mood and affect.  Health Maintenance Due  Topic Date Due   Hepatitis C Screening  Never done     Assessment & Plan:   Problem List Items Addressed This Visit       Endocrine   Hypothyroidism    I will reassess her TSH today. I will have her continue to take 100 mcg daily.      Relevant Orders   TSH   Other Visit Diagnoses     RUQ abdominal pain    -  Primary   Abdominal pain is supsicious for acute cholecystitis. I will check labs and an ultrasound. She shodul remain on clear liquids for now.   Relevant Orders   CBC   Comprehensive metabolic panel   Lipase   US ABDOMEN LIMITED RUQ (LIVER/GB)       Return for Follow-up after testing/imaging.   Loyola Mast, MD  Addendum:     Latest Ref Rng & Units 10/21/2022   11:38 AM 07/22/2022    5:00 AM 07/21/2022    4:26 AM  CMP  Glucose 70 - 99 mg/dL 99  657  846   BUN 6 - 23 mg/dL 14  21  23    Creatinine 0.40 - 1.20 mg/dL 9.62  9.52  8.41   Sodium 135 - 145 mEq/L 144  134  132   Potassium 3.5 - 5.1 mEq/L 4.6  4.5  3.4   Chloride 96 - 112 mEq/L 104  95  89   CO2 19 - 32 mEq/L 31  29  30    Calcium 8.4 - 10.5 mg/dL 9.7  9.4  9.2   Total Protein 6.0 - 8.3 g/dL 6.5  6.6  7.1   Total Bilirubin 0.2 - 1.2 mg/dL 0.6  0.5  0.4   Alkaline Phos 39 - 117 U/L 63  66  75   AST 0 - 37 U/L 16  26  24    ALT 0 - 35 U/L 10  22  20        Latest Ref Rng & Units 10/21/2022   11:38 AM 08/13/2022    1:31 PM 07/20/2022    5:57 AM  CBC  WBC 4.0 - 10.5 K/uL 6.4  5.6   8.5   Hemoglobin 12.0 - 15.0 g/dL 95.6  21.3  08.6   Hematocrit 36.0 - 46.0 % 39.7  37.8  36.8   Platelets 150.0 - 400.0 K/uL 190.0  268.0  227    Lab Results  Component Value Date   LIPASE 8.0 (L) 10/21/2022   Imaging: RUQ ultrasound (10/21/2022) IMPRESSION: Cholelithiasis. Although there is a positive sonographic Murphy sign, absence of wall thickening and/or pericholecystic fluid makes acute cholecystitis less likely. Please correlate clinically. If further evaluation is desired, a hepatobiliary scan could be considered.  Assessment/Plan: 1. RUQ abdominal pain 2. Calculus of gallbladder without cholecystitis without obstruction Called and discussed lab and ultrasound results with Kimberly [name]. We will proceed with gettign her in to see a surgeon for evaluation.  - Ambulatory referral to General Surgery - ondansetron (ZOFRAN-ODT) 4 MG disintegrating tablet; Take 1 tablet (4 mg total) by mouth every 8 (eight) hours as needed for nausea or vomiting.  Dispense: 20 tablet; Refill: 0 - traMADol (ULTRAM) 50 MG tablet; Take 1 tablet (50 mg total) by mouth every 8 (eight) hours as needed for up to 5 days.  Dispense: 15 tablet; Refill: 0  Loyola Mast, MD

## 2022-10-21 NOTE — Addendum Note (Signed)
Addended by: Loyola Mast on: 10/21/2022 04:36 PM   Modules accepted: Orders

## 2022-10-21 NOTE — Assessment & Plan Note (Signed)
I will reassess her TSH today. I will have her continue to take 100 mcg daily.

## 2022-10-22 MED ORDER — LEVOTHYROXINE SODIUM 88 MCG PO TABS
88.0000 ug | ORAL_TABLET | Freq: Every day | ORAL | 3 refills | Status: DC
Start: 2022-10-22 — End: 2023-07-23

## 2022-10-22 NOTE — Addendum Note (Signed)
Addended by: Loyola Mast on: 10/22/2022 08:17 AM   Modules accepted: Orders

## 2022-10-26 ENCOUNTER — Other Ambulatory Visit: Payer: Self-pay | Admitting: Family Medicine

## 2022-10-26 DIAGNOSIS — I10 Essential (primary) hypertension: Secondary | ICD-10-CM

## 2022-10-28 ENCOUNTER — Encounter: Payer: Self-pay | Admitting: Family Medicine

## 2022-11-02 ENCOUNTER — Encounter: Payer: Self-pay | Admitting: Family Medicine

## 2022-11-07 ENCOUNTER — Telehealth: Payer: Self-pay | Admitting: Pulmonary Disease

## 2022-11-07 NOTE — Telephone Encounter (Signed)
Yes patient needs to be seen in clinic for clearance prior to surgery. Scheduled for 11/28/22.

## 2022-11-07 NOTE — Telephone Encounter (Signed)
Fax received from Dr. Kris Mouton with [facility] to perform Gallbladder Surgery on patient.  Patient needs surgery clearance. Surgery is Pending. Patient was seen on 08/29/22. Office protocol is a risk assessment can be sent to surgeon if patient has been seen in 60 days or less.   Sending to Dr. Everardo All for risk assessment or recommendations if patient needs to be seen in office prior to surgical procedure.     Patient has ov scheduled on 11/28/22

## 2022-11-14 ENCOUNTER — Encounter: Payer: Self-pay | Admitting: Family Medicine

## 2022-11-15 ENCOUNTER — Encounter: Payer: Self-pay | Admitting: Family Medicine

## 2022-11-18 ENCOUNTER — Telehealth: Payer: Self-pay | Admitting: Pulmonary Disease

## 2022-11-18 ENCOUNTER — Encounter (HOSPITAL_BASED_OUTPATIENT_CLINIC_OR_DEPARTMENT_OTHER): Payer: Self-pay | Admitting: Pulmonary Disease

## 2022-11-18 NOTE — Telephone Encounter (Signed)
Can you look at this to get it fixed?  Thanks. Dm/cma

## 2022-11-18 NOTE — Telephone Encounter (Signed)
Pt requested sooner appt  I have her scheduled with Beth on 11/20/22  Her surgery is to be done 11/25/22

## 2022-11-18 NOTE — Telephone Encounter (Signed)
I called and spoke with the pt and scheduled ov with Beth for 11/20/22 at 10 am  Pt aware of location

## 2022-11-18 NOTE — Telephone Encounter (Signed)
Patient is calling because she is having surgery on Monday September 23rd and she needs to be seen before she has surgery. [phone]

## 2022-11-19 NOTE — Telephone Encounter (Signed)
Beth, if patient is stable at your next evaluation on 11/20/22 then ok to cancel her appointment and move follow-up with me in 3 months

## 2022-11-19 NOTE — Telephone Encounter (Signed)
Patient has been scheduled for 11/20/22 for pre-op clearance.

## 2022-11-20 ENCOUNTER — Ambulatory Visit: Payer: PPO | Admitting: Primary Care

## 2022-11-20 ENCOUNTER — Encounter: Payer: Self-pay | Admitting: Primary Care

## 2022-11-20 VITALS — BP 134/90 | HR 77 | Temp 97.8°F | Ht 62.0 in | Wt 147.0 lb

## 2022-11-20 DIAGNOSIS — Z01811 Encounter for preprocedural respiratory examination: Secondary | ICD-10-CM | POA: Diagnosis not present

## 2022-11-20 MED ORDER — FLUTICASONE-SALMETEROL 250-50 MCG/ACT IN AEPB: 1.0000 | INHALATION_SPRAY | Freq: Two times a day (BID) | RESPIRATORY_TRACT | 3 refills | Status: DC

## 2022-11-20 NOTE — Telephone Encounter (Signed)
Flagged for chart correction on 11/19/22 at 1406.

## 2022-11-20 NOTE — Patient Instructions (Addendum)
Ok from pulmonary standpoint to have surgery.  You will be an intermediate risk for mechanical ventilation and/or postop pulmonary complications due to your history of asthmatic bronchitis, age and nature of surgery. Exam today was benign.  Chest x-ray in June showed clear lungs.  Recommend you do start inhaler called Wixela (long acting bronchodilator +steroid) twice daily for asthma symptoms hide printed for her review note minus Hevia  Recommendations Use Wixela inhaler 1 puff morning and evening (rinse mouth after use)  Use albuterol 2 puffs every 4-6 hours as needed for breakthrough shortness of breath or wheezing Use incentive spirometer 10 times an hour while awake postop  Follow-up 6 months with Dr. Everardo All or sooner if needed

## 2022-11-20 NOTE — Assessment & Plan Note (Signed)
-   Ok from pulmonary standpoint to have surgery. She will be an intermediate risk for mechanical ventilation and/or postop pulmonary complications due to age, nature of surgery and history of asthmatic bronchitis/covid. Exam today was benign. Chest x-ray in June showed clear lungs.

## 2022-11-20 NOTE — Progress Notes (Signed)
@Patient  ID: Kimberly [name], female    DOB: 1950/05/30, 72 y.o.   MRN: [id number]  Chief Complaint  Patient presents with   surgica clearance    Referring provider: Loyola Mast, MD  HPI: 72 year old female, former smoker quit 1995.  Past medical history significant for hypertension, asthma, hiatal hernia, GERD, hypothyroidism, osteopenia, post COVID chronic cough, restless leg syndrome  Previous [facility] pulmonary encounter:   Reason for Visit: New consult for covid hospital follow-up  Kimberly [name] is 72 year old female former smoker with asthmatic bronchitis, HTN, hypothyroidism, barrett esophagus, GERD, hx uterine cancer s/p partial hysterectomy, RLS who presents as a new consult.  Recent hospitalization for COVID from 07/19/22-07/22/22. Treated with decadron. Her cough has improved but still having two episodes lasting 15 min a day associated with thick sputum. Some wheezing associated. Has shortness of breath with long distances. Worse with heat. Improves with air conditioning and tussionex. Has had chest pain post cough that is significant  She reports longstanding history of asthmatic bronchitis/asthma since childhood. Hx of hives.Reports annual URI. Never had a hospitalization related to lung issues until this year. [place]  Social History: Former smoker. Quit in 1995/1996 Retired Research officer, political party business Drinks 4 glasses a month  I have personally reviewed patient's past medical/family/social history, allergies, current medications.   Previous [facility] pulmonary encounter: 08/29/22- Dr. Lanna Poche  Reason for Visit: New consult for covid hospital follow-up  Kimberly [name] is 72 year old female former smoker with asthmatic bronchitis, HTN, hypothyroidism, barrett esophagus, GERD, hx uterine cancer s/p partial hysterectomy, RLS who presents as a new consult.  Recent hospitalization for COVID from 07/19/22-07/22/22. Treated with decadron. Her  cough has improved but still having two episodes lasting 15 min a day associated with thick sputum. Some wheezing associated. Has shortness of breath with long distances. Worse with heat. Improves with air conditioning and tussionex. Has had chest pain post cough that is significant  She reports longstanding history of asthmatic bronchitis/asthma since childhood. Hx of hives.Reports annual URI. Never had a hospitalization related to lung issues until this year. [place].  Social History: Former smoker. Quit in 1995/1996 Retired Audiological scientist estate business Drinks 4 glasses a month   11/20/2022- Interim hx  Patient presents today for surgical risk assessment. History of asthmatic bronchitis/post COVID bronchitis. She was originally seen in June 2024 by Dr. Everardo All and was started on Advair. Breathing wise she feel much better .She had long covid symptoms. She reports significant improvement in her breathing with Airsupra. She could not afford inhaler and was given a sample. She currently only has Albuterol rescue inhaler and uses this average once daily. She has intermittent wheezing symptoms which is basline for her and an evening cough with purulent sputum production. She is able to do all activities of daily living, she will periodically need to stop and rest but recovers quickly. Chest x-ray in June showed clear lungs.  Allergies  Allergen Reactions   Codeine Hives   Sulfa Antibiotics Hives and Itching    Immunization History  Administered Date(s) Administered   Moderna Covid-19 Vaccine Bivalent Booster 75yrs & up 12/26/2020   PFIZER Comirnaty(Gray Top)Covid-19 Tri-Sucrose Vaccine 05/12/2019, 06/13/2019   Td 03/04/2016    Past Medical History:  Diagnosis Date   Anxiety    Asthma    Barrett esophagus    Chronic headaches    Colon polyps    COVID 03/2020   Depression  GERD (gastroesophageal reflux disease)    Hypertension    Kidney stones     Pneumonia    Thyroid disease    Uterine cancer (HCC)     Tobacco History: Social History   Tobacco Use  Smoking Status Former   Current packs/day: 0.00   Types: Cigarettes   Quit date: 1995   Years since quitting: 29.7  Smokeless Tobacco Never   Counseling given: Not Answered   Outpatient Medications Prior to Visit  Medication Sig Dispense Refill   albuterol (VENTOLIN HFA) 108 (90 Base) MCG/ACT inhaler INHALE 2 PUFFS BY MOUTH EVERY 4 HOURS AS NEEDED 8.5 g 3   aspirin 81 MG chewable tablet Chew 81 mg by mouth daily.     Biotin 3 MG TABS Take by mouth.     buPROPion (WELLBUTRIN SR) 150 MG 12 hr tablet TAKE 1 TABLET(150 MG) BY MOUTH TWICE DAILY 180 tablet 3   cholecalciferol (VITAMIN D3) 25 MCG (1000 UNIT) tablet      Cranberry 1000 MG CAPS Take by mouth.     diazepam (VALIUM) 5 MG tablet TAKE 1/2 TO 1 TABLET(2.5 TO 5 MG) BY MOUTH IN THE MORNING AND AT BEDTIME. MAY TAKE AN ADDITIONAL DOSE DAILY AS NEEDED 60 tablet 1   fluticasone (FLONASE) 50 MCG/ACT nasal spray Place 1 spray into both nostrils daily.     levothyroxine (SYNTHROID) 88 MCG tablet Take 1 tablet (88 mcg total) by mouth daily at 6 (six) AM. 90 tablet 3   metoprolol succinate (TOPROL-XL) 25 MG 24 hr tablet TAKE 1 TABLET(25 MG) BY MOUTH DAILY 90 tablet 3   rOPINIRole (REQUIP) 0.5 MG tablet TAKE 1 TABLET(0.5 MG) BY MOUTH AT BEDTIME 30 tablet 5   chlorpheniramine-HYDROcodone (TUSSIONEX) 10-8 MG/5ML Take 5 mLs by mouth every 12 (twelve) hours as needed for cough. (Patient not taking: Reported on 11/20/2022) 70 mL 0   Albuterol-Budesonide (AIRSUPRA) 90-80 MCG/ACT AERO Inhale 2 puffs into the lungs 2 (two) times daily. (Patient not taking: Reported on 11/20/2022)     fluticasone-salmeterol (ADVAIR HFA) 115-21 MCG/ACT inhaler Inhale 2 puffs into the lungs 2 (two) times daily. (Patient not taking: Reported on 11/20/2022) 1 each 0   ondansetron (ZOFRAN-ODT) 4 MG disintegrating tablet Take 1 tablet (4 mg total) by mouth every 8 (eight)  hours as needed for nausea or vomiting. (Patient not taking: Reported on 11/20/2022) 20 tablet 0   No facility-administered medications prior to visit.   Review of Systems  Review of Systems  Constitutional: Negative.   HENT: Negative.    Respiratory:  Positive for cough and wheezing. Negative for shortness of breath.    Physical Exam  BP (!) 134/90 (BP Location: Left Arm, Patient Position: Sitting, Cuff Size: Normal)   Pulse 77   Temp 97.8 F (36.6 C) (Oral)   Ht 5\' 2"  (1.575 m)   Wt 147 lb (66.7 kg)   SpO2 95%   BMI 26.89 kg/m  Physical Exam Constitutional:      Appearance: Normal appearance.  HENT:     Head: Normocephalic and atraumatic.  Cardiovascular:     Rate and Rhythm: Normal rate and regular rhythm.  Pulmonary:     Effort: Pulmonary effort is normal. No respiratory distress.     Breath sounds: No wheezing or rhonchi.     Comments: Upper airway wheeze Neurological:     General: No focal deficit present.     Mental Status: She is oriented to person, place, and time. Mental status is at baseline.  Psychiatric:        Mood and Affect: Mood normal.        Behavior: Behavior normal.        Thought Content: Thought content normal.        Judgment: Judgment normal.      Lab Results:  CBC    Component Value Date/Time   WBC 6.4 10/21/2022 1138   RBC 4.58 10/21/2022 1138   HGB 13.1 10/21/2022 1138   HCT 39.7 10/21/2022 1138   PLT 190.0 10/21/2022 1138   MCV 86.7 10/21/2022 1138   MCH 28.4 07/20/2022 0557   MCHC 33.0 10/21/2022 1138   RDW 13.6 10/21/2022 1138   LYMPHSABS 2.3 08/13/2022 1331   MONOABS 0.5 08/13/2022 1331   EOSABS 0.3 08/13/2022 1331   BASOSABS 0.0 08/13/2022 1331    BMET    Component Value Date/Time   NA 144 10/21/2022 1138   K 4.6 10/21/2022 1138   CL 104 10/21/2022 1138   CO2 31 10/21/2022 1138   GLUCOSE 99 10/21/2022 1138   BUN 14 10/21/2022 1138   CREATININE 0.60 10/21/2022 1138   CALCIUM 9.7 10/21/2022 1138   GFRNONAA >60  07/22/2022 0500    BNP    Component Value Date/Time   BNP 85.6 07/19/2022 1019    ProBNP No results found for: "PROBNP"  Imaging: US ABDOMEN LIMITED RUQ (LIVER/GB)  Result Date: 10/21/2022 CLINICAL DATA:  Right upper quadrant pain, nausea, vomiting and diarrhea. EXAM: ULTRASOUND ABDOMEN LIMITED RIGHT UPPER QUADRANT COMPARISON:  None Available. FINDINGS: Gallbladder: Echogenic stones measure up to 6 mm. No wall thickening or pericholecystic fluid. Positive sonographic Murphy sign. Common bile duct: Diameter: 2 mm, within normal limits. No intrahepatic biliary ductal dilatation. Liver: No focal lesion identified. Within normal limits in parenchymal echogenicity. Portal vein is patent on color Doppler imaging with normal direction of blood flow towards the liver. Other: None. IMPRESSION: Cholelithiasis. Although there is a positive sonographic Murphy sign, absence of wall thickening and/or pericholecystic fluid makes acute cholecystitis less likely. Please correlate clinically. If further evaluation is desired, a hepatobiliary scan could be considered. Electronically Signed   By: Leanna Battles M.D.   On: 10/21/2022 16:13     Assessment & Plan:   Asthma - Stable; Patient noticed clinical improvement from AirSupra but can not afford - Sending in RX Wixela 250-68mcg one puff twice daily for asthma symptoms   Recommendations Use Wixela inhaler 1 puff morning and evening (rinse mouth after use)  Use albuterol 2 puffs every 4-6 hours as needed for breakthrough shortness of breath or wheezing Use incentive spirometer 10 times an hour while awake postop  Follow-up 6 months with Dr. Everardo All or sooner if needed  Preop respiratory exam - Ok from pulmonary standpoint to have surgery. She will be an intermediate risk for mechanical ventilation and/or postop pulmonary complications due to age, nature of surgery and history of asthmatic bronchitis/covid. Exam today was benign. Chest x-ray in June  showed clear lungs.    Glenford Bayley, NP 11/20/2022

## 2022-11-20 NOTE — Pre-Procedure Instructions (Signed)
Surgical Instructions   Your procedure is scheduled on Monday, September 23rd. Report to [facility] Main Entrance "A" at 1:30 P.M., then check in with the Admitting office. Any questions or running late day of surgery: call [phone]  Questions prior to your surgery date: call [phone], Monday-Friday, 8am-4pm. If you experience any cold or flu symptoms such as cough, fever, chills, shortness of breath, etc. between now and your scheduled surgery, please notify us at the above number.     Remember:  Do not eat after midnight the night before your surgery   You may drink clear liquids until 12:30 PM the day of your surgery.   Clear liquids allowed are: Water, Non-Citrus Juices (without pulp), Carbonated Beverages, Clear Tea, Black Coffee Only (NO MILK, CREAM OR POWDERED CREAMER of any kind), and Gatorade.    Take these medicines the morning of surgery with A SIP OF WATER  buPROPion (WELLBUTRIN SR)  fluticasone (FLONASE)  fluticasone-salmeterol (WIXELA INHUB)  levothyroxine (SYNTHROID)  metoprolol succinate (TOPROL-XL)    May take these medicines IF NEEDED: albuterol (VENTOLIN HFA)- bring inhaler with you on day of surgery diazepam (VALIUM)    Follow your surgeon's instructions on when to stop Aspirin.  If no instructions were given by your surgeon then you will need to call the office to get those instructions.     One week prior to surgery, STOP taking any Aleve, Naproxen, Ibuprofen, Motrin, Advil, Goody's, BC's, all herbal medications, fish oil, and non-prescription vitamins.                     Do NOT Smoke (Tobacco/Vaping) for 24 hours prior to your procedure.  If you use a CPAP at night, you may bring your mask/headgear for your overnight stay.   You will be asked to remove any contacts, glasses, piercing's, hearing aid's, dentures/partials prior to surgery. Please bring cases for these items if needed.    Patients discharged the day of surgery will not be allowed  to drive home, and someone needs to stay with them for 24 hours.  SURGICAL WAITING ROOM VISITATION Patients may have no more than 2 support people in the waiting area - these visitors may rotate.   Pre-op nurse will coordinate an appropriate time for 1 ADULT support person, who may not rotate, to accompany patient in pre-op.  Children under the age of 72 must have an adult with them who is not the patient and must remain in the main waiting area with an adult.  If the patient needs to stay at the hospital during part of their recovery, the visitor guidelines for inpatient rooms apply.  Please refer to the [facility] website for the visitor guidelines for any additional information.   If you received a COVID test during your pre-op visit  it is requested that you wear a mask when out in public, stay away from anyone that may not be feeling well and notify your surgeon if you develop symptoms. If you have been in contact with anyone that has tested positive in the last 10 days please notify you surgeon.      Pre-operative CHG Bathing Instructions   You can play a key role in reducing the risk of infection after surgery. Your skin needs to be as free of germs as possible. You can reduce the number of germs on your skin by washing with CHG (chlorhexidine gluconate) soap before surgery. CHG is an antiseptic soap that kills germs and continues to kill  germs even after washing.   DO NOT use if you have an allergy to chlorhexidine/CHG or antibacterial soaps. If your skin becomes reddened or irritated, stop using the CHG and notify one of our RNs at [phone].              TAKE A SHOWER THE NIGHT BEFORE SURGERY AND THE DAY OF SURGERY    Please keep in mind the following:  DO NOT shave, including legs and underarms, 48 hours prior to surgery.   You may shave your face before/day of surgery.  Place clean sheets on your bed the night before surgery Use a clean washcloth (not used since being  washed) for each shower. DO NOT sleep with pet's night before surgery.  CHG Shower Instructions:  Wash your face and private area with normal soap. If you choose to wash your hair, wash first with your normal shampoo.  After you use shampoo/soap, rinse your hair and body thoroughly to remove shampoo/soap residue.  Turn the water OFF and apply half the bottle of CHG soap to a CLEAN washcloth.  Apply CHG soap ONLY FROM YOUR NECK DOWN TO YOUR TOES (washing for 3-5 minutes)  DO NOT use CHG soap on face, private areas, open wounds, or sores.  Pay special attention to the area where your surgery is being performed.  If you are having back surgery, having someone wash your back for you may be helpful. Wait 2 minutes after CHG soap is applied, then you may rinse off the CHG soap.  Pat dry with a clean towel  Put on clean pajamas    Additional instructions for the day of surgery: DO NOT APPLY any lotions, deodorants, cologne, or perfumes.   Do not wear jewelry or makeup Do not wear nail polish, gel polish, artificial nails, or any other type of covering on natural nails (fingers and toes) Do not bring valuables to the hospital. [facility] is not responsible for valuables/personal belongings. Put on clean/comfortable clothes.  Please brush your teeth.  Ask your nurse before applying any prescription medications to the skin.

## 2022-11-20 NOTE — Assessment & Plan Note (Addendum)
-   Stable; Patient noticed clinical improvement from AirSupra but can not afford - Sending in RX Wixela 250-51mcg one puff twice daily for asthma symptoms   Recommendations Use Wixela inhaler 1 puff morning and evening (rinse mouth after use)  Use albuterol 2 puffs every 4-6 hours as needed for breakthrough shortness of breath or wheezing Use incentive spirometer 10 times an hour while awake postop  Follow-up 6 months with Dr. Everardo All or sooner if needed

## 2022-11-21 ENCOUNTER — Encounter (HOSPITAL_COMMUNITY): Payer: Self-pay

## 2022-11-21 ENCOUNTER — Encounter (HOSPITAL_COMMUNITY)
Admission: RE | Admit: 2022-11-21 | Discharge: 2022-11-21 | Disposition: A | Discharge status: Home / Self Care | Payer: PPO | Source: Ambulatory Visit | Attending: Surgery | Admitting: Surgery

## 2022-11-21 ENCOUNTER — Other Ambulatory Visit: Payer: Self-pay

## 2022-11-21 ENCOUNTER — Encounter: Payer: Self-pay | Admitting: Family Medicine

## 2022-11-21 VITALS — BP 149/80 | HR 80 | Temp 97.7°F | Resp 18 | Ht 62.0 in | Wt 146.6 lb

## 2022-11-21 DIAGNOSIS — U099 Post covid-19 condition, unspecified: Secondary | ICD-10-CM | POA: Diagnosis not present

## 2022-11-21 DIAGNOSIS — I10 Essential (primary) hypertension: Secondary | ICD-10-CM | POA: Insufficient documentation

## 2022-11-21 DIAGNOSIS — Z01812 Encounter for preprocedural laboratory examination: Secondary | ICD-10-CM | POA: Insufficient documentation

## 2022-11-21 DIAGNOSIS — J45909 Unspecified asthma, uncomplicated: Secondary | ICD-10-CM | POA: Insufficient documentation

## 2022-11-21 DIAGNOSIS — Z01811 Encounter for preprocedural respiratory examination: Secondary | ICD-10-CM | POA: Diagnosis not present

## 2022-11-21 DIAGNOSIS — R053 Chronic cough: Secondary | ICD-10-CM | POA: Diagnosis not present

## 2022-11-21 DIAGNOSIS — Z01818 Encounter for other preprocedural examination: Secondary | ICD-10-CM | POA: Diagnosis present

## 2022-11-21 LAB — BASIC METABOLIC PANEL
Anion gap: 9 (ref 5–15)
BUN: 15 mg/dL (ref 8–23)
CO2: 27 mmol/L (ref 22–32)
Calcium: 9.6 mg/dL (ref 8.9–10.3)
Chloride: 104 mmol/L (ref 98–111)
Creatinine, Ser: 0.65 mg/dL (ref 0.44–1.00)
GFR, Estimated: >60 mL/min (ref >60)
Glucose, Bld: 94 mg/dL (ref 70–99)
Potassium: 5 mmol/L (ref 3.5–5.1)
Sodium: 140 mmol/L (ref 135–145)

## 2022-11-21 LAB — CBC
HCT: 40.2 % (ref 36.0–46.0)
Hemoglobin: 12.6 g/dL (ref 12.0–15.0)
MCH: 27.5 pg (ref 26.0–34.0)
MCHC: 31.3 g/dL (ref 30.0–36.0)
MCV: 87.6 fL (ref 80.0–100.0)
Platelets: 179 10*3/uL (ref 150–400)
RBC: 4.59 MIL/uL (ref 3.87–5.11)
RDW: 12.4 % (ref 11.5–15.5)
WBC: 6.4 10*3/uL (ref 4.0–10.5)
nRBC: 0 % (ref 0.0–0.2)

## 2022-11-21 NOTE — Progress Notes (Signed)
PCP - Dr. Herbie Drape Cardiologist - Denies  PPM/ICD - Denies  Chest x-ray - N/A EKG - 07/19/22 Stress Test - denies ECHO - denies Cardiac Cath - denies  Sleep Study - denies CPAP - n/a  Fasting Blood Sugar - denies Checks Blood Sugar _____ times a day n/a  Last dose of GLP1 agonist-  n/a GLP1 instructions: n/a  Blood Thinner Instructions:n/a Aspirin Instructions: follow up with surgeon  ERAS Protcol - eras PRE-SURGERY n/a   COVID TEST- n/a   Anesthesia review: y, pulmonology clearance 9/18   Patient denies shortness of breath, fever, cough and chest pain at PAT appointment.  Patient refuses any respiratory illnesses at this time   All instructions explained to the patient, with a verbal understanding of the material. Patient agrees to go over the instructions while at home for a better understanding. Patient also instructed to self quarantine after being tested for COVID-19. The opportunity to ask questions was provided.

## 2022-11-21 NOTE — Telephone Encounter (Signed)
Kimberly [name]'s note from 11/20/22 was faxed to [facility]

## 2022-11-22 NOTE — Anesthesia Preprocedure Evaluation (Signed)
Anesthesia Evaluation  Patient identified by MRN, date of birth, ID band Patient awake    Reviewed: Allergy & Precautions, NPO status , Patient's Chart, lab work & pertinent test results  Airway Mallampati: I  TM Distance: >3 FB Neck ROM: Full    Dental no notable dental hx. (+) Partial Upper, Dental Advisory Given   Pulmonary asthma (asthmatic Brochitis S/P covid may 2024) , former smoker   Pulmonary exam normal breath sounds clear to auscultation       Cardiovascular hypertension, Pt. on medications and Pt. on home beta blockers Normal cardiovascular exam Rhythm:Regular Rate:Normal     Neuro/Psych  PSYCHIATRIC DISORDERS Anxiety        GI/Hepatic ,GERD  ,,  Endo/Other  Hypothyroidism    Renal/GU Renal disease     Musculoskeletal  (+) Arthritis , Osteoarthritis,    Abdominal   Peds  Hematology   Anesthesia Other Findings All: Codeine Sulfa  Reproductive/Obstetrics                             Anesthesia Physical Anesthesia Plan  ASA: 2  Anesthesia Plan: General   Post-op Pain Management: Ofirmev IV (intra-op)*   Induction: Intravenous  PONV Risk Score and Plan: 3 and Treatment may vary due to age or medical condition, Ondansetron and Midazolam  Airway Management Planned: Oral ETT  Additional Equipment: None  Intra-op Plan:   Post-operative Plan: Extubation in OR  Informed Consent: I have reviewed the patients History and Physical, chart, labs and discussed the procedure including the risks, benefits and alternatives for the proposed anesthesia with the patient or authorized representative who has indicated his/her understanding and acceptance.     Dental advisory given  Plan Discussed with: CRNA and Anesthesiologist  Anesthesia Plan Comments: (PAT note by Antionette Poles, PA-C:  72 year old female who follows with pulmonology for history of former smoker (quit 1995) with  asthmatic bronchitis, persistent cough after COVID infection in May 2024.  Last seen by Ames Dura, NP on 11/20/2022 for preop evaluation.  Per note, "-Ok from pulmonary standpoint to have surgery. She will be an intermediate risk for mechanical ventilation and/or postop pulmonary complications due to age, nature of surgery and history of asthmatic bronchitis/covid. Exam today was benign. Chest x-ray in June showed clear lungs."  Other pertinent history includes HTN maintained on metoprolol, hypothyroidism on Synthroid, GERD and Barrett's esophagus maintained on famotidine.  Preop labs reviewed, WNL.  EKG 07/19/2022 (in the setting of admission for acute respiratory failure secondary to COVID): Sinus rhythm.  Rate 83.  Borderline prolonged PR interval.  LAD, consider left anterior fascicular block.  Left ventriculography.  Anterior Q waves, possibly due to LVH.  ST elevation suggest acute pericarditis.  CHEST - 2 VIEW 08/13/2022: COMPARISON:  07/19/2022.  FINDINGS: Bilateral lung fields are clear. Bilateral costophrenic angles are clear.  Normal cardio-mediastinal silhouette.  Age indeterminate mild-to-moderate anterior wedging deformities of mid/lower thoracic vertebra noted (please see electronic arrow sign on image 2). No prior comparison available. No significant retropulsion or spinal canal compromise. Correlate clinically to determine the need for additional imaging with nonemergent MRI lumbar spine.  Otherwise, no acute osseous abnormalities.  The soft tissues are within normal limits.  IMPRESSION: *No active cardiopulmonary disease. *Age indeterminate wedging deformities of thoracic spine, as described above.  )        Anesthesia Quick Evaluation

## 2022-11-22 NOTE — Progress Notes (Signed)
Anesthesia Chart Review:  72 year old female who follows with pulmonology for history of former smoker (quit 1995) with asthmatic bronchitis, persistent cough after COVID infection in May 2024.  Last seen by Ames Dura, NP on 11/20/2022 for preop evaluation.  Per note, "-Ok from pulmonary standpoint to have surgery. She will be an intermediate risk for mechanical ventilation and/or postop pulmonary complications due to age, nature of surgery and history of asthmatic bronchitis/covid. Exam today was benign. Chest x-ray in June showed clear lungs."  Other pertinent history includes HTN maintained on metoprolol, hypothyroidism on Synthroid, GERD and Barrett's esophagus maintained on famotidine.  Preop labs reviewed, WNL.  EKG 07/19/2022 (in the setting of admission for acute respiratory failure secondary to COVID): Sinus rhythm.  Rate 83.  Borderline prolonged PR interval.  LAD, consider left anterior fascicular block.  Left ventriculography.  Anterior Q waves, possibly due to LVH.  ST elevation suggest acute pericarditis.  CHEST - 2 VIEW 08/13/2022: COMPARISON:  07/19/2022.   FINDINGS: Bilateral lung fields are clear. Bilateral costophrenic angles are clear.   Normal cardio-mediastinal silhouette.   Age indeterminate mild-to-moderate anterior wedging deformities of mid/lower thoracic vertebra noted (please see electronic arrow sign on image 2). No prior comparison available. No significant retropulsion or spinal canal compromise. Correlate clinically to determine the need for additional imaging with nonemergent MRI lumbar spine.   Otherwise, no acute osseous abnormalities.   The soft tissues are within normal limits.   IMPRESSION: *No active cardiopulmonary disease. *Age indeterminate wedging deformities of thoracic spine, as described above.     Zannie Cove [facility] Short Stay Center/Anesthesiology Phone [phone] 11/22/2022 10:03 AM

## 2022-11-25 ENCOUNTER — Ambulatory Visit (HOSPITAL_BASED_OUTPATIENT_CLINIC_OR_DEPARTMENT_OTHER): Payer: PPO

## 2022-11-25 ENCOUNTER — Ambulatory Visit (HOSPITAL_COMMUNITY): Payer: Self-pay | Admitting: Physician Assistant

## 2022-11-25 ENCOUNTER — Encounter (HOSPITAL_COMMUNITY): Payer: Self-pay | Admitting: Surgery

## 2022-11-25 ENCOUNTER — Ambulatory Visit: Payer: PPO | Admitting: Family Medicine

## 2022-11-25 ENCOUNTER — Ambulatory Visit (HOSPITAL_COMMUNITY)
Admission: RE | Admit: 2022-11-25 | Discharge: 2022-11-25 | Disposition: A | Discharge status: Home / Self Care | Payer: PPO | Attending: Surgery | Admitting: Surgery

## 2022-11-25 ENCOUNTER — Encounter (HOSPITAL_COMMUNITY)
Admission: RE | Disposition: A | Discharge status: Home / Self Care | Payer: Self-pay | Source: Home / Self Care | Attending: Surgery

## 2022-11-25 ENCOUNTER — Other Ambulatory Visit: Payer: Self-pay

## 2022-11-25 DIAGNOSIS — K219 Gastro-esophageal reflux disease without esophagitis: Secondary | ICD-10-CM | POA: Insufficient documentation

## 2022-11-25 DIAGNOSIS — Z87891 Personal history of nicotine dependence: Secondary | ICD-10-CM | POA: Insufficient documentation

## 2022-11-25 DIAGNOSIS — J45909 Unspecified asthma, uncomplicated: Secondary | ICD-10-CM | POA: Insufficient documentation

## 2022-11-25 DIAGNOSIS — K8010 Calculus of gallbladder with chronic cholecystitis without obstruction: Secondary | ICD-10-CM | POA: Diagnosis present

## 2022-11-25 DIAGNOSIS — Z8542 Personal history of malignant neoplasm of other parts of uterus: Secondary | ICD-10-CM | POA: Diagnosis not present

## 2022-11-25 DIAGNOSIS — I10 Essential (primary) hypertension: Secondary | ICD-10-CM | POA: Insufficient documentation

## 2022-11-25 DIAGNOSIS — K8020 Calculus of gallbladder without cholecystitis without obstruction: Secondary | ICD-10-CM

## 2022-11-25 HISTORY — PX: CHOLECYSTECTOMY: SHX55

## 2022-11-25 SURGERY — LAPAROSCOPIC CHOLECYSTECTOMY
Anesthesia: General | Site: Abdomen | Laterality: Bilateral

## 2022-11-25 MED ORDER — ALBUTEROL SULFATE HFA 108 (90 BASE) MCG/ACT IN AERS
INHALATION_SPRAY | RESPIRATORY_TRACT | Status: DC | PRN
Start: 2022-11-25 — End: 2022-11-25
  Administered 2022-11-25 (×2): 2 via RESPIRATORY_TRACT

## 2022-11-25 MED ORDER — HYDROMORPHONE HCL 1 MG/ML IJ SOLN
INTRAMUSCULAR | Status: AC
  Filled 2022-11-25: qty 1

## 2022-11-25 MED ORDER — ALBUTEROL SULFATE HFA 108 (90 BASE) MCG/ACT IN AERS
INHALATION_SPRAY | RESPIRATORY_TRACT | Status: AC
  Filled 2022-11-25: qty 6.7

## 2022-11-25 MED ORDER — METHOCARBAMOL 750 MG PO TABS: 750.0000 mg | ORAL_TABLET | Freq: Four times a day (QID) | ORAL | 1 refills | Status: DC

## 2022-11-25 MED ORDER — SUGAMMADEX SODIUM 200 MG/2ML IV SOLN
INTRAVENOUS | Status: DC | PRN
  Administered 2022-11-25: 150 mg via INTRAVENOUS

## 2022-11-25 MED ORDER — PROPOFOL 10 MG/ML IV BOLUS
INTRAVENOUS | Status: DC | PRN
  Administered 2022-11-25: 100 mg via INTRAVENOUS

## 2022-11-25 MED ORDER — BUPIVACAINE HCL (PF) 0.25 % IJ SOLN
INTRAMUSCULAR | Status: AC
  Filled 2022-11-25: qty 30

## 2022-11-25 MED ORDER — LIDOCAINE 2% (20 MG/ML) 5 ML SYRINGE
INTRAMUSCULAR | Status: DC | PRN
  Administered 2022-11-25: 60 mg via INTRAVENOUS

## 2022-11-25 MED ORDER — ONDANSETRON HCL 4 MG/2ML IJ SOLN
INTRAMUSCULAR | Status: DC | PRN
  Administered 2022-11-25: 4 mg via INTRAVENOUS

## 2022-11-25 MED ORDER — OXYCODONE HCL 5 MG/5ML PO SOLN
ORAL | Status: AC
  Filled 2022-11-25: qty 5

## 2022-11-25 MED ORDER — 0.9 % SODIUM CHLORIDE (POUR BTL) OPTIME
TOPICAL | Status: DC | PRN
  Administered 2022-11-25: 1000 mL

## 2022-11-25 MED ORDER — CEFAZOLIN SODIUM-DEXTROSE 2-3 GM-%(50ML) IV SOLR
INTRAVENOUS | Status: DC | PRN
Start: 2022-11-25 — End: 2022-11-25
  Administered 2022-11-25: 2 g via INTRAVENOUS

## 2022-11-25 MED ORDER — DEXAMETHASONE SODIUM PHOSPHATE 10 MG/ML IJ SOLN
INTRAMUSCULAR | Status: AC
  Filled 2022-11-25: qty 1

## 2022-11-25 MED ORDER — CEFAZOLIN SODIUM 1 G IJ SOLR
INTRAMUSCULAR | Status: AC
  Filled 2022-11-25: qty 20

## 2022-11-25 MED ORDER — ROCURONIUM BROMIDE 10 MG/ML (PF) SYRINGE
PREFILLED_SYRINGE | INTRAVENOUS | Status: DC | PRN
  Administered 2022-11-25: 30 mg via INTRAVENOUS

## 2022-11-25 MED ORDER — AMISULPRIDE (ANTIEMETIC) 5 MG/2ML IV SOLN
INTRAVENOUS | Status: AC
  Filled 2022-11-25: qty 4

## 2022-11-25 MED ORDER — MIDAZOLAM HCL 2 MG/2ML IJ SOLN
INTRAMUSCULAR | Status: AC
  Filled 2022-11-25: qty 2

## 2022-11-25 MED ORDER — DEXAMETHASONE SODIUM PHOSPHATE 10 MG/ML IJ SOLN
INTRAMUSCULAR | Status: DC | PRN
  Administered 2022-11-25: 4 mg via INTRAVENOUS

## 2022-11-25 MED ORDER — SUCCINYLCHOLINE CHLORIDE 200 MG/10ML IV SOSY
PREFILLED_SYRINGE | INTRAVENOUS | Status: DC | PRN
  Administered 2022-11-25: 80 mg via INTRAVENOUS

## 2022-11-25 MED ORDER — AMISULPRIDE (ANTIEMETIC) 5 MG/2ML IV SOLN
10.0000 mg | Freq: Once | INTRAVENOUS | Status: AC | PRN
  Administered 2022-11-25: 10 mg via INTRAVENOUS

## 2022-11-25 MED ORDER — ACETAMINOPHEN 10 MG/ML IV SOLN: 1000.0000 mg | Freq: Once | INTRAVENOUS | Status: DC | PRN

## 2022-11-25 MED ORDER — LACTATED RINGERS IV SOLN: INTRAVENOUS | Status: DC

## 2022-11-25 MED ORDER — ACETAMINOPHEN 10 MG/ML IV SOLN
INTRAVENOUS | Status: AC
  Administered 2022-11-25: 1000 mg
  Filled 2022-11-25: qty 100

## 2022-11-25 MED ORDER — OXYCODONE HCL 5 MG/5ML PO SOLN
5.0000 mg | Freq: Once | ORAL | Status: AC | PRN
  Administered 2022-11-25: 5 mg via ORAL

## 2022-11-25 MED ORDER — ROCURONIUM BROMIDE 10 MG/ML (PF) SYRINGE
PREFILLED_SYRINGE | INTRAVENOUS | Status: AC
  Filled 2022-11-25: qty 10

## 2022-11-25 MED ORDER — IBUPROFEN 600 MG PO TABS: 600.0000 mg | ORAL_TABLET | Freq: Four times a day (QID) | ORAL | 1 refills | Status: DC

## 2022-11-25 MED ORDER — FENTANYL CITRATE (PF) 250 MCG/5ML IJ SOLN
INTRAMUSCULAR | Status: AC
  Filled 2022-11-25: qty 5

## 2022-11-25 MED ORDER — HYDROMORPHONE HCL 1 MG/ML IJ SOLN
0.2500 mg | INTRAMUSCULAR | Status: DC | PRN
  Administered 2022-11-25 (×4): 0.5 mg via INTRAVENOUS

## 2022-11-25 MED ORDER — BUPIVACAINE LIPOSOME 1.3 % IJ SUSP
INTRAMUSCULAR | Status: AC
  Filled 2022-11-25: qty 20

## 2022-11-25 MED ORDER — DOCUSATE SODIUM 100 MG PO CAPS: 100.0000 mg | ORAL_CAPSULE | Freq: Two times a day (BID) | ORAL | 2 refills | Status: DC

## 2022-11-25 MED ORDER — LIDOCAINE 2% (20 MG/ML) 5 ML SYRINGE
INTRAMUSCULAR | Status: AC
  Filled 2022-11-25: qty 5

## 2022-11-25 MED ORDER — SUCCINYLCHOLINE CHLORIDE 200 MG/10ML IV SOSY
PREFILLED_SYRINGE | INTRAVENOUS | Status: AC
  Filled 2022-11-25: qty 10

## 2022-11-25 MED ORDER — ONDANSETRON HCL 4 MG/2ML IJ SOLN
INTRAMUSCULAR | Status: AC
  Filled 2022-11-25: qty 2

## 2022-11-25 MED ORDER — KETOROLAC TROMETHAMINE 30 MG/ML IJ SOLN
INTRAMUSCULAR | Status: AC
  Filled 2022-11-25: qty 1

## 2022-11-25 MED ORDER — ONDANSETRON HCL 4 MG/2ML IJ SOLN: 4.0000 mg | Freq: Once | INTRAMUSCULAR | Status: DC | PRN

## 2022-11-25 MED ORDER — ORAL CARE MOUTH RINSE: 15.0000 mL | Freq: Once | OROMUCOSAL | Status: AC

## 2022-11-25 MED ORDER — OXYCODONE HCL 5 MG PO TABS: 5.0000 mg | ORAL_TABLET | Freq: Once | ORAL | Status: AC | PRN

## 2022-11-25 MED ORDER — CHLORHEXIDINE GLUCONATE 0.12 % MT SOLN
15.0000 mL | Freq: Once | OROMUCOSAL | Status: AC
  Administered 2022-11-25: 15 mL via OROMUCOSAL
  Filled 2022-11-25: qty 15

## 2022-11-25 MED ORDER — OXYCODONE HCL 5 MG PO TABS
5.0000 mg | ORAL_TABLET | ORAL | 0 refills | Status: DC | PRN
Start: 2022-11-25 — End: 2023-01-06

## 2022-11-25 MED ORDER — BUPIVACAINE LIPOSOME 1.3 % IJ SUSP
INTRAMUSCULAR | Status: DC | PRN
  Administered 2022-11-25: 10 mL via SURGICAL_CAVITY

## 2022-11-25 MED ORDER — FENTANYL CITRATE (PF) 250 MCG/5ML IJ SOLN
INTRAMUSCULAR | Status: DC | PRN
  Administered 2022-11-25 (×2): 50 ug via INTRAVENOUS

## 2022-11-25 MED ORDER — MIDAZOLAM HCL 2 MG/2ML IJ SOLN
INTRAMUSCULAR | Status: DC | PRN
  Administered 2022-11-25: 2 mg via INTRAVENOUS

## 2022-11-25 MED ORDER — ACETAMINOPHEN 500 MG PO TABS: 1000.0000 mg | ORAL_TABLET | Freq: Four times a day (QID) | ORAL | 3 refills | Status: AC

## 2022-11-25 MED ORDER — PROPOFOL 10 MG/ML IV BOLUS
INTRAVENOUS | Status: AC
  Filled 2022-11-25: qty 20

## 2022-11-25 SURGICAL SUPPLY — 41 items
ADH SKN CLS APL DERMABOND .7 (GAUZE/BANDAGES/DRESSINGS) ×1
APL PRP STRL LF DISP 70% ISPRP (MISCELLANEOUS) ×1
APPLIER CLIP 5 13 M/L LIGAMAX5 (MISCELLANEOUS) ×1
APR CLP MED LRG 5 ANG JAW (MISCELLANEOUS) ×1
BAG SPEC RTRVL 10 TROC 200 (ENDOMECHANICALS) ×1
BLADE CLIPPER SURG (BLADE) IMPLANT
CANISTER SUCT 3000ML PPV (MISCELLANEOUS) ×1 IMPLANT
CHLORAPREP W/TINT 26 (MISCELLANEOUS) ×1 IMPLANT
CLIP APPLIE 5 13 M/L LIGAMAX5 (MISCELLANEOUS) ×1 IMPLANT
COVER SURGICAL LIGHT HANDLE (MISCELLANEOUS) ×1 IMPLANT
DERMABOND ADVANCED .7 DNX12 (GAUZE/BANDAGES/DRESSINGS) ×1 IMPLANT
DISSECTOR BLUNT TIP ENDO 5MM (MISCELLANEOUS) IMPLANT
ELECT CAUTERY BLADE 6.4 (BLADE) ×1 IMPLANT
ELECT REM PT RETURN 9FT ADLT (ELECTROSURGICAL) ×1
ELECTRODE REM PT RTRN 9FT ADLT (ELECTROSURGICAL) ×1 IMPLANT
GLOVE BIO SURGEON STRL SZ 6.5 (GLOVE) ×1 IMPLANT
GLOVE BIOGEL PI IND STRL 6 (GLOVE) ×1 IMPLANT
GOWN STRL REUS W/ TWL LRG LVL3 (GOWN DISPOSABLE) ×3 IMPLANT
GOWN STRL REUS W/TWL LRG LVL3 (GOWN DISPOSABLE) ×3
IRRIG SUCT STRYKERFLOW 2 WTIP (MISCELLANEOUS)
IRRIGATION SUCT STRKRFLW 2 WTP (MISCELLANEOUS) IMPLANT
KIT BASIN OR (CUSTOM PROCEDURE TRAY) ×1 IMPLANT
KIT IMAGING PINPOINTPAQ (MISCELLANEOUS) IMPLANT
KIT TURNOVER KIT B (KITS) ×1 IMPLANT
NS IRRIG 1000ML POUR BTL (IV SOLUTION) ×1 IMPLANT
PAD ARMBOARD 7.5X6 YLW CONV (MISCELLANEOUS) ×1 IMPLANT
PENCIL BUTTON HOLSTER BLD 10FT (ELECTRODE) ×1 IMPLANT
POUCH RETRIEVAL ECOSAC 10 (ENDOMECHANICALS) ×1 IMPLANT
SCISSORS LAP 5X35 DISP (ENDOMECHANICALS) ×1 IMPLANT
SET TUBE SMOKE EVAC HIGH FLOW (TUBING) ×1 IMPLANT
SLEEVE Z-THREAD 5X100MM (TROCAR) ×2 IMPLANT
SUT MNCRL AB 4-0 PS2 18 (SUTURE) ×1 IMPLANT
SUT VIC AB 0 UR5 27 (SUTURE) IMPLANT
SUT VICRYL 0 AB UR-6 (SUTURE) IMPLANT
TOWEL GREEN STERILE FF (TOWEL DISPOSABLE) ×1 IMPLANT
TRAY LAPAROSCOPIC MC (CUSTOM PROCEDURE TRAY) ×1 IMPLANT
TROCAR 11X100 Z THREAD (TROCAR) IMPLANT
TROCAR BALLN 12MMX100 BLUNT (TROCAR) ×1 IMPLANT
TROCAR Z-THREAD OPTICAL 5X100M (TROCAR) ×1 IMPLANT
WARMER LAPAROSCOPE (MISCELLANEOUS) ×1 IMPLANT
WATER STERILE IRR 1000ML POUR (IV SOLUTION) ×1 IMPLANT

## 2022-11-25 NOTE — Transfer of Care (Signed)
Immediate Anesthesia Transfer of Care Note  Patient: Kimberly [name]  Procedure(s) Performed: LAPAROSCOPIC CHOLECYSTECTOMY (Bilateral: Abdomen)  Patient Location: PACU  Anesthesia Type:General  Level of Consciousness: awake and alert   Airway & Oxygen Therapy: Patient Spontanous Breathing and Patient connected to nasal cannula oxygen  Post-op Assessment: Report given to RN and Post -op Vital signs reviewed and stable  Post vital signs: Reviewed and stable  Last Vitals:  Vitals Value Taken Time  BP 172/85 11/25/22 1640  Temp    Pulse 73 11/25/22 1642  Resp 15 11/25/22 1642  SpO2 100 % 11/25/22 1642  Vitals shown include unfiled device data.  Last Pain:  Vitals:   11/25/22 1403  PainSc: 0-No pain         Complications: No notable events documented.

## 2022-11-25 NOTE — Op Note (Signed)
   Operative Note  Date: 11/25/2022  Procedure: laparoscopic cholecystectomy  Pre-op diagnosis: symptomatic cholelithiasis Post-op diagnosis: same  Indication and clinical history: The patient is a 72 y.o. year old female with symptomatic cholelithiasis  Surgeon: Diamantina Monks, MD  Anesthesiologist: Richardson Landry, MD Anesthesia: General  Findings:  Specimen: gallbladder EBL: <5cc Drains/Implants: none  Disposition: PACU - hemodynamically stable.  Description of procedure: The patient was positioned supine on the operating room table. Time-out was performed verifying correct patient, procedure, signature of informed consent, and administration of pre-operative antibiotics. General anesthetic induction and intubation were uneventful. The abdomen was prepped and draped in the usual sterile fashion. Veress entry was performed in the LUQ and pneumoperitoneum established, which the patient tolerated well. Trocar insertion with a 5mm port was performed using an optiview technique and the abdominal cavity was inspected and no injury of any intra-abdominal structures was identified. Additional ports were placed under direct visualization and using local anesthetic: two 5mm ports in the right subcostal region and a 5mm port left of the umbilicus. The patient was re-positioned to reverse Trendelenburg and right side up. Adhesiolysis was performed to expose the gallbladder, which was then retracted cephalad. The infundibulum was identified and retracted toward the right lower quadrant. The peritoneum was incised over the infundibulum and the triangle of Calot dissected to expose the critical view of safety. With clear identification and isolation of the cystic duct and two cystic artery branches, the cystic artery branches were doubly clipped and divided. After this, the cystic duct was identified as a single structure entering the gallbladder, and was also doubly clipped and divided. The gallbladder was  dissected off the liver bed using electrocautery and hemostasis of the liver bed was confirmed prior to separation of the final peritoneal attachments of the gallbladder to the liver bed. The gallbladder fossa was irrigated and fluid returned clear. After transection of the final peritoneal attachments, the gallbladder was placed in an endoscopic specimen retrieval bag, removed via the umbilical port site, and sent to pathology as a permanent specimen. The gallbladder fossa was inspected confirming hemostasis, the absence of bile leakage from the cystic duct stump, and correct placement of clips on the cystic artery and cystic duct stumps. The abdomen was desufflated and the fascia of the umbilical port site was closed using the previously placed stay sutures. Additional local anesthetic was administered at the umbilical port site.  The skin of all incisions was closed with 4-0 monocryl. Sterile dressings were applied. All sponge and instrument counts were correct at the conclusion of the procedure. The patient was awakened from anesthesia, extubated uneventfully, and transported to the PACU - hemodynamically stable.. There were no complications.    Diamantina Monks, MD General and Trauma Surgery [facility]

## 2022-11-25 NOTE — Anesthesia Procedure Notes (Signed)
Procedure Name: Intubation Date/Time: 11/25/2022 3:36 PM  Performed by: April Holding, CRNAPre-anesthesia Checklist: Patient identified, Emergency Drugs available, Suction available and Patient being monitored Patient Re-evaluated:Patient Re-evaluated prior to induction Oxygen Delivery Method: Circle system utilized Preoxygenation: Pre-oxygenation with 100% oxygen Induction Type: IV induction, Rapid sequence and Cricoid Pressure applied Ventilation: Mask ventilation without difficulty Laryngoscope Size: Miller and 2 Grade View: Grade I Tube type: Oral Tube size: 7.0 mm Number of attempts: 1 Airway Equipment and Method: Stylet and Oral airway Placement Confirmation: ETT inserted through vocal cords under direct vision, positive ETCO2 and breath sounds checked- equal and bilateral Secured at: 21 cm Tube secured with: Tape Dental Injury: Teeth and Oropharynx as per pre-operative assessment

## 2022-11-25 NOTE — Anesthesia Postprocedure Evaluation (Signed)
Anesthesia Post Note  Patient: Jazzlynne Amis  Procedure(s) Performed: LAPAROSCOPIC CHOLECYSTECTOMY (Bilateral: Abdomen)     Patient location during evaluation: PACU Anesthesia Type: General Level of consciousness: awake and alert Pain management: pain level controlled Vital Signs Assessment: post-procedure vital signs reviewed and stable Respiratory status: spontaneous breathing, nonlabored ventilation, respiratory function stable and patient connected to nasal cannula oxygen Cardiovascular status: blood pressure returned to baseline and stable Postop Assessment: no apparent nausea or vomiting Anesthetic complications: no   No notable events documented.  Last Vitals:  Vitals:   11/25/22 1730 11/25/22 1745  BP: (!) 173/84 (!) 175/85  Pulse: 76 72  Resp: (!) 24 20  Temp:  (!) 36.2 C  SpO2: 99% 98%    Last Pain:  Vitals:   11/25/22 1745  PainSc: 3                  Trevor Iha

## 2022-11-25 NOTE — Discharge Instructions (Addendum)
[facility]  LAPAROSCOPIC SURGERY: POST OP INSTRUCTIONS Always review your discharge instruction sheet given to you by the facility where your surgery was performed. IF YOU HAVE DISABILITY OR FAMILY LEAVE FORMS, YOU MUST BRING THEM TO THE OFFICE FOR PROCESSING.   DO NOT GIVE THEM TO YOUR DOCTOR.  PAIN CONTROL  Pain regimen: take over-the-counter tylenol (acetaminophen) 1000mg  every six hours, the prescription ibuprofen (600mg ) every six hours and the robaxin (methocarbamol) 750mg  every six hours. With all three of these, you should be taking something every two hours. Example: tylenol (acetaminophen) at 8am, ibuprofen at 10am, robaxin (methocarbamol) at 12pm, tylenol (acetaminophen) again at 2pm, ibuprofen again at 4pm, robaxin (methocarbamol) at 6pm. You also have a prescription for oxycodone, which should be taken if the tylenol (acetaminophen), ibuprofen, and robaxin (methocarbamol) are not enough to control your pain. You may take the oxycodone as frequently as every four hours as needed, but if you are taking the other medications as above, you should not need the oxycodone this frequently. You have also been given a prescription for colace (docusate) which is a stool softener. Please take this as prescribed because the oxycodone can cause constipation and the colace (docusate) will minimize or prevent constipation. Do not drive while taking or under the influence of the oxycodone as it is a narcotic medication. Use ice packs to help control pain. If you need a refill on your pain medication, please contact your pharmacy.  They will contact our office to request authorization. Prescriptions will not be filled after 5pm or on week-ends.  HOME MEDICATIONS Take your usually prescribed medications unless otherwise directed.  DIET You should follow a light diet the first few days after arrival home.  Be sure to include lots of fluids daily.   CONSTIPATION It is common to  experience some constipation after surgery and if you are taking pain medication.  Increasing fluid intake and taking a stool softener (such as Colace) will usually help or prevent this problem from occurring.  A mild laxative (Miralax, over-the counter) should be taken according to package instructions if there are no bowel movements after 48 hours. If still no bowel movement 24 hours after taking Miralax, you may try magnesium citrate, available over the counter at a local pharmacy.   WOUND/INCISION CARE Most patients will experience some swelling and bruising in the area of the incisions.  Ice packs will help.  Swelling and bruising can take several days to resolve.  May shower beginning 11/26/22.  Do not peel off or scrub skin glue. May allow warm soapy water to run over incision, then rinse and pat dry.  Do not soak in any water (tubs, hot tubs, pools, lakes, oceans) for one week.   ACTIVITIES You may resume regular (light) daily activities beginning the next day--such as daily self-care, walking, climbing stairs--gradually increasing activities as tolerated.  You may have sexual intercourse when it is comfortable.   No lifting greater than 5 pounds for six weeks.  You may drive when you are no longer taking narcotic pain medication, you can comfortably wear a seatbelt, and you can safely maneuver your car and apply brakes.  FOLLOW-UP You should see your doctor in the office for a follow-up appointment approximately 2-3 weeks after your surgery.  You should have been given your post-op/follow-up appointment when your surgery was scheduled.  If you did not receive a post-op/follow-up appointment, make sure that you call for this appointment within a day or two after  you arrive home to ensure a convenient appointment time.  WHEN TO CALL YOUR DOCTOR: Fever over 101.5 Inability to urinate Continued bleeding from incision. Increased pain, redness, or drainage from the incision. Increasing  abdominal pain  The clinic staff is available to answer your questions during regular business hours.  Please don't hesitate to call and ask to speak to one of the nurses for clinical concerns.  If you have a medical emergency, go to the nearest emergency room or call 911.  A surgeon from [facility] is always on call at the hospital. [address], [place], [place]  [address] ? [address] [address], [place], [place]   [address] [phone] ? [phone] ? FAX [phone] Web site: [email or web address]

## 2022-11-25 NOTE — H&P (Signed)
   Kimberly [name] is an 72 y.o. female.   HPI: 9F with symptomatic cholelithiasis. Plan lap chole. The patient has had no hospitalizations, ER visits, surgeries, or newly diagnosed allergies since being seen in the office. Pre-op risk stratification with Pulm: intermediate risk, started on Wixela. Persistent RUQ and back pain.    Past Medical History:  Diagnosis Date   Anxiety    Asthma    Barrett esophagus    Chronic headaches    Colon polyps    COVID 03/2020   Depression    GERD (gastroesophageal reflux disease)    Hypertension    Kidney stones    Pneumonia    Thyroid disease    Uterine cancer (HCC) 1980    Past Surgical History:  Procedure Laterality Date   ANTERIOR AND POSTERIOR VAGINAL REPAIR W/ SACROSPINOUS LIGAMENT SUSPENSION  06/29/2021   PARTIAL HYSTERECTOMY  1980   Pre-cancerous condition. Failed laser surgery. still have ovaries   UMBILICAL HERNIA REPAIR  2004   with mesh   WRIST SURGERY Left 2019    Family History  Problem Relation Age of Onset   Diabetes Mother    Breast cancer Mother    Colon cancer Father    Kidney failure Father    COPD Brother    Diabetes Brother    Heart disease Brother    Heart disease Maternal Uncle    Heart disease Daughter    Depression Son    Esophageal cancer Neg Hx    Stomach cancer Neg Hx    Rectal cancer Neg Hx     Social History:  reports that she quit smoking about 29 years ago. Her smoking use included cigarettes. She has never used smokeless tobacco. She reports that she does not currently use alcohol after a past usage of about 1.0 standard drink of alcohol per week. She reports that she does not use drugs.  Allergies:  Allergies  Allergen Reactions   Codeine Hives   Sulfa Antibiotics Hives and Itching    Medications: I have reviewed the patient's current medications.  No results found for this or any previous visit (from the past 48 hour(s)).  No results found.  ROS 10 point review of systems is  negative except as listed above in HPI.   Physical Exam Blood pressure (!) 168/84, pulse 73, temperature 97.6 F (36.4 C), resp. rate 18, height 5\' 2"  (1.575 m), weight 66.2 kg, SpO2 98%. Constitutional: well-developed, well-nourished HEENT: pupils equal, round, reactive to light, 2mm b/l, moist conjunctiva, external inspection of ears and nose normal, hearing intact Oropharynx: normal oropharyngeal mucosa, normal dentition Neck: no thyromegaly, trachea midline, no midline cervical tenderness to palpation Chest: breath sounds equal bilaterally, normal respiratory effort, no midline or lateral chest wall tenderness to palpation/deformity Abdomen: soft, NT, no bruising, no hepatosplenomegaly Skin: warm, dry, no rashes Psych: normal memory, normal mood/affect     Assessment/Plan: Symptomatic cholelithiasis - plan lap chole. Informed consent was obtained after detailed explanation of risks, including bleeding, infection, biloma, hematoma, injury to common bile duct, need for IOC to delineate anatomy, and need for conversion to open procedure. All questions answered to the patient's satisfaction. Son is picking her up.    Diamantina Monks, MD General and Trauma Surgery [facility]

## 2022-11-26 ENCOUNTER — Encounter (HOSPITAL_COMMUNITY): Payer: Self-pay | Admitting: Surgery

## 2022-11-27 LAB — SURGICAL PATHOLOGY

## 2022-11-28 ENCOUNTER — Ambulatory Visit (HOSPITAL_BASED_OUTPATIENT_CLINIC_OR_DEPARTMENT_OTHER): Payer: PPO | Admitting: Pulmonary Disease

## 2022-12-02 ENCOUNTER — Encounter: Payer: Self-pay | Admitting: Family Medicine

## 2022-12-04 ENCOUNTER — Ambulatory Visit (INDEPENDENT_AMBULATORY_CARE_PROVIDER_SITE_OTHER): Payer: PPO | Admitting: Family Medicine

## 2022-12-04 VITALS — BP 130/76 | HR 82 | Temp 98.0°F | Ht 62.0 in | Wt 143.4 lb

## 2022-12-04 DIAGNOSIS — Z9049 Acquired absence of other specified parts of digestive tract: Secondary | ICD-10-CM

## 2022-12-04 DIAGNOSIS — R051 Acute cough: Secondary | ICD-10-CM | POA: Insufficient documentation

## 2022-12-04 MED ORDER — HYDROCOD POLI-CHLORPHE POLI ER 10-8 MG/5ML PO SUER: 5.0000 mL | Freq: Two times a day (BID) | ORAL | 0 refills | Status: DC | PRN

## 2022-12-04 NOTE — Assessment & Plan Note (Addendum)
There may be a component of allergies involved. Cotninue Wixella with PRN albuterol. I will prescribe Tussionex, which should help her cough and any allergy component.

## 2022-12-04 NOTE — Progress Notes (Signed)
[facility] [address] [place] [address] Dept: [phone] Dept Fax: [phone]  Office Visit  Subjective:    Patient ID: Kimberly [name], female    DOB: 1951-02-06, 72 y.o..   MRN: [id number]  Chief Complaint  Patient presents with   Hospitalization Follow-up    Hospital f/u.  C/o having a persistent cough since surgery.  Also have a pain in lower abdomen this morning.    History of Present Illness:  Patient is in today for follow-up from her recent laparoscopic cholecystectomy. She had the surgery on 11/25/2022. She feels her surgical sites are doing quite well. She is no longer needing her pain medication. She does complain of a cough that started about 5 days after surgery. She notes she has been coughing up phlegm/mucous. This has been impacted her appetite. She is not running fever and is not dyspneic.   Past Medical History: Patient Active Problem List   Diagnosis Date Noted   Preop respiratory exam 11/20/2022   Post-COVID chronic cough 08/13/2022   COVID-19 07/20/2022   SUI (stress urinary incontinence, female) 03/23/2021   Hiatal hernia 09/22/2020   GERD (gastroesophageal reflux disease) 09/22/2020   Diverticulosis 09/22/2020   Osteopenia 09/08/2020   Restless leg syndrome 06/05/2020   Hypothyroidism 03/29/2020   Essential hypertension 03/29/2020   Kidney stones 03/29/2020   Asthma 03/29/2020   Anxiety disorder 03/29/2020   Past Surgical History:  Procedure Laterality Date   ANTERIOR AND POSTERIOR VAGINAL REPAIR W/ SACROSPINOUS LIGAMENT SUSPENSION  06/29/2021   CHOLECYSTECTOMY Bilateral 11/25/2022   Procedure: LAPAROSCOPIC CHOLECYSTECTOMY;  Surgeon: Diamantina Monks, MD;  Location: [facility] OR;  Service: General;  Laterality: Bilateral;   PARTIAL HYSTERECTOMY  1980   Pre-cancerous condition. Failed laser surgery. still have ovaries   UMBILICAL HERNIA REPAIR  2004   with mesh   WRIST SURGERY Left 2019    Family History  Problem Relation Age of Onset   Diabetes Mother    Breast cancer Mother    Colon cancer Father    Kidney failure Father    COPD Brother    Diabetes Brother    Heart disease Brother    Heart disease Maternal Uncle    Heart disease Daughter    Depression Son    Esophageal cancer Neg Hx    Stomach cancer Neg Hx    Rectal cancer Neg Hx    Outpatient Medications Prior to Visit  Medication Sig Dispense Refill   acetaminophen (TYLENOL) 500 MG tablet Take 2 tablets (1,000 mg total) by mouth 4 (four) times daily. 120 tablet 3   albuterol (VENTOLIN HFA) 108 (90 Base) MCG/ACT inhaler INHALE 2 PUFFS BY MOUTH EVERY 4 HOURS AS NEEDED 8.5 g 3   aspirin 81 MG chewable tablet Chew 81 mg by mouth daily.     Biotin 3 MG TABS Take by mouth.     buPROPion (WELLBUTRIN SR) 150 MG 12 hr tablet TAKE 1 TABLET(150 MG) BY MOUTH TWICE DAILY 180 tablet 3   cholecalciferol (VITAMIN D3) 25 MCG (1000 UNIT) tablet Take 1,000 Units by mouth daily.     Cranberry 1000 MG CAPS Take 2 capsules by mouth daily.     diazepam (VALIUM) 5 MG tablet TAKE 1/2 TO 1 TABLET(2.5 TO 5 MG) BY MOUTH IN THE MORNING AND AT BEDTIME. MAY TAKE AN ADDITIONAL DOSE DAILY AS NEEDED (Patient taking differently: Take 2.5 mg by mouth as needed for muscle spasms.) 60 tablet 1   docusate sodium (COLACE)  100 MG capsule Take 1 capsule (100 mg total) by mouth 2 (two) times daily. 60 capsule 2   famotidine (PEPCID) 20 MG tablet Take 20 mg by mouth 2 (two) times daily.     fluticasone (FLONASE) 50 MCG/ACT nasal spray Place 1 spray into both nostrils daily.     fluticasone-salmeterol (WIXELA INHUB) 250-50 MCG/ACT AEPB Inhale 1 puff into the lungs in the morning and at bedtime. 60 each 3   ibuprofen (ADVIL) 600 MG tablet Take 1 tablet (600 mg total) by mouth 4 (four) times daily. 120 tablet 1   levothyroxine (SYNTHROID) 88 MCG tablet Take 1 tablet (88 mcg total) by mouth daily at 6 (six) AM. 90 tablet 3   methocarbamol (ROBAXIN-750) 750  MG tablet Take 1 tablet (750 mg total) by mouth 4 (four) times daily. 120 tablet 1   metoprolol succinate (TOPROL-XL) 25 MG 24 hr tablet TAKE 1 TABLET(25 MG) BY MOUTH DAILY 90 tablet 3   oxyCODONE (ROXICODONE) 5 MG immediate release tablet Take 1 tablet (5 mg total) by mouth every 4 (four) hours as needed. 10 tablet 0   rOPINIRole (REQUIP) 0.5 MG tablet TAKE 1 TABLET(0.5 MG) BY MOUTH AT BEDTIME 30 tablet 5   chlorpheniramine-HYDROcodone (TUSSIONEX) 10-8 MG/5ML Take 5 mLs by mouth every 12 (twelve) hours as needed for cough. 70 mL 0   No facility-administered medications prior to visit.   Allergies  Allergen Reactions   Codeine Hives   Sulfa Antibiotics Hives and Itching     Objective:   Today's Vitals   12/04/22 0906  BP: 130/76  Pulse: 82  Temp: 98 F (36.7 C)  TempSrc: Temporal  SpO2: 94%  Weight: 143 lb 6.4 oz (65 kg)  Height: 5\' 2"  (1.575 m)   Body mass index is 26.23 kg/m.   General: Well developed, well nourished. No acute distress. HEENT: Normocephalic, non-traumatic. Conjunctiva clear. External ears normal. EAC and TMs normal   bilaterally. Nose clear without congestion or rhinorrhea. Mucous membranes moist. There is mild mucous   streaking of the posterior oropharynx. Good dentition. Neck: Supple. No lymphadenopathy. No thyromegaly. Lungs: Clear to auscultation bilaterally. No wheezing, rales or rhonchi. CV: RRR without murmurs or rubs. Pulses 2+ bilaterally. Psych: Alert and oriented. Normal mood and affect.  Health Maintenance Due  Topic Date Due   Hepatitis C Screening  Never done   Zoster Vaccines- Shingrix (1 of 2) Never done   COVID-19 Vaccine (4 - 2023-24 season) 11/03/2022     Assessment & Plan:   Problem List Items Addressed This Visit       Other   Acute cough    There may be a component of allergies involved. Cotninue Wixella with PRN albuterol. I will prescribe Tussionex, which should help her cough and any allergy component.       Relevant  Medications   chlorpheniramine-HYDROcodone (TUSSIONEX) 10-8 MG/5ML   Other Visit Diagnoses     Status post laparoscopic cholecystectomy    -  Primary   Doing well post-op. Coninue to advance diet and activities as tolerated.       Return in about 3 months (around 03/06/2023) for Reassessment.   Loyola Mast, MD

## 2022-12-11 ENCOUNTER — Encounter: Payer: Self-pay | Admitting: Family Medicine

## 2022-12-11 DIAGNOSIS — R051 Acute cough: Secondary | ICD-10-CM

## 2022-12-12 MED ORDER — BENZONATATE 200 MG PO CAPS
200.0000 mg | ORAL_CAPSULE | Freq: Two times a day (BID) | ORAL | 2 refills | Status: DC | PRN
Start: 2022-12-12 — End: 2023-04-08

## 2022-12-13 ENCOUNTER — Encounter: Payer: Self-pay | Admitting: Family Medicine

## 2022-12-16 ENCOUNTER — Ambulatory Visit (INDEPENDENT_AMBULATORY_CARE_PROVIDER_SITE_OTHER): Payer: PPO | Admitting: Family Medicine

## 2022-12-16 VITALS — BP 126/78 | HR 79 | Temp 98.0°F | Ht 62.0 in | Wt 144.8 lb

## 2022-12-16 DIAGNOSIS — J40 Bronchitis, not specified as acute or chronic: Secondary | ICD-10-CM | POA: Insufficient documentation

## 2022-12-16 MED ORDER — DOXYCYCLINE HYCLATE 100 MG PO TABS: 100.0000 mg | ORAL_TABLET | Freq: Two times a day (BID) | ORAL | 0 refills | Status: DC

## 2022-12-16 NOTE — Progress Notes (Signed)
[name] [address] [place] [address] Dept: [phone] Dept Fax: [phone]  Office Visit  Subjective:    Patient ID: Kimberly [name], female    DOB: 16-Apr-1950, 72 y.o..   MRN: [id number]  Chief Complaint  Patient presents with   Cough    C/o having an persistent cough x 1 week.      History of Present Illness:  Patient is in today for re-evaluation of her cough. I had seen Kimberly [name] on 10/2 for follow-up from her recent cholecystectomy. She complained of a cough that started about 5 days after surgery. She notes she has been coughing up phlegm/mucous. She has not run fever and is not dyspneic. I had treated her with a Tessalon and Tussionex. She notes the sputum has changed in color and notes no lessening of her cough currently. Kimberly [name] has a past history of heavy tobacco use for ~ 24 years (2 ppd). She quit in 1995.  Past Medical History: Patient Active Problem List   Diagnosis Date Noted   Acute cough 12/04/2022   Preop respiratory exam 11/20/2022   Post-COVID chronic cough 08/13/2022   COVID-19 07/20/2022   SUI (stress urinary incontinence, female) 03/23/2021   Hiatal hernia 09/22/2020   GERD (gastroesophageal reflux disease) 09/22/2020   Diverticulosis 09/22/2020   Osteopenia 09/08/2020   Restless leg syndrome 06/05/2020   Hypothyroidism 03/29/2020   Essential hypertension 03/29/2020   Kidney stones 03/29/2020   Asthma 03/29/2020   Anxiety disorder 03/29/2020   Past Surgical History:  Procedure Laterality Date   ANTERIOR AND POSTERIOR VAGINAL REPAIR W/ SACROSPINOUS LIGAMENT SUSPENSION  06/29/2021   CHOLECYSTECTOMY Bilateral 11/25/2022   Procedure: LAPAROSCOPIC CHOLECYSTECTOMY;  Surgeon: Diamantina Monks, MD;  Location: [facility] OR;  Service: General;  Laterality: Bilateral;   PARTIAL HYSTERECTOMY  1980   Pre-cancerous condition. Failed laser surgery. still have ovaries   UMBILICAL HERNIA REPAIR   2004   with mesh   WRIST SURGERY Left 2019   Family History  Problem Relation Age of Onset   Diabetes Mother    Breast cancer Mother    Colon cancer Father    Kidney failure Father    COPD Brother    Diabetes Brother    Heart disease Brother    Heart disease Maternal Uncle    Heart disease Daughter    Depression Son    Esophageal cancer Neg Hx    Stomach cancer Neg Hx    Rectal cancer Neg Hx    Outpatient Medications Prior to Visit  Medication Sig Dispense Refill   acetaminophen (TYLENOL) 500 MG tablet Take 2 tablets (1,000 mg total) by mouth 4 (four) times daily. 120 tablet 3   albuterol (VENTOLIN HFA) 108 (90 Base) MCG/ACT inhaler INHALE 2 PUFFS BY MOUTH EVERY 4 HOURS AS NEEDED 8.5 g 3   aspirin 81 MG chewable tablet Chew 81 mg by mouth daily.     benzonatate (TESSALON) 200 MG capsule Take 1 capsule (200 mg total) by mouth 2 (two) times daily as needed for cough. 20 capsule 2   Biotin 3 MG TABS Take by mouth.     buPROPion (WELLBUTRIN SR) 150 MG 12 hr tablet TAKE 1 TABLET(150 MG) BY MOUTH TWICE DAILY 180 tablet 3   cholecalciferol (VITAMIN D3) 25 MCG (1000 UNIT) tablet Take 1,000 Units by mouth daily.     Cranberry 1000 MG CAPS Take 2 capsules by mouth daily.     diazepam (VALIUM) 5 MG tablet  TAKE 1/2 TO 1 TABLET(2.5 TO 5 MG) BY MOUTH IN THE MORNING AND AT BEDTIME. MAY TAKE AN ADDITIONAL DOSE DAILY AS NEEDED (Patient taking differently: Take 2.5 mg by mouth as needed for muscle spasms.) 60 tablet 1   docusate sodium (COLACE) 100 MG capsule Take 1 capsule (100 mg total) by mouth 2 (two) times daily. 60 capsule 2   famotidine (PEPCID) 20 MG tablet Take 20 mg by mouth 2 (two) times daily.     fluticasone (FLONASE) 50 MCG/ACT nasal spray Place 1 spray into both nostrils daily.     fluticasone-salmeterol (WIXELA INHUB) 250-50 MCG/ACT AEPB Inhale 1 puff into the lungs in the morning and at bedtime. 60 each 3   ibuprofen (ADVIL) 600 MG tablet Take 1 tablet (600 mg total) by mouth 4  (four) times daily. 120 tablet 1   levothyroxine (SYNTHROID) 88 MCG tablet Take 1 tablet (88 mcg total) by mouth daily at 6 (six) AM. 90 tablet 3   methocarbamol (ROBAXIN-750) 750 MG tablet Take 1 tablet (750 mg total) by mouth 4 (four) times daily. 120 tablet 1   metoprolol succinate (TOPROL-XL) 25 MG 24 hr tablet TAKE 1 TABLET(25 MG) BY MOUTH DAILY 90 tablet 3   oxyCODONE (ROXICODONE) 5 MG immediate release tablet Take 1 tablet (5 mg total) by mouth every 4 (four) hours as needed. 10 tablet 0   rOPINIRole (REQUIP) 0.5 MG tablet TAKE 1 TABLET(0.5 MG) BY MOUTH AT BEDTIME 30 tablet 5   chlorpheniramine-HYDROcodone (TUSSIONEX) 10-8 MG/5ML Take 5 mLs by mouth every 12 (twelve) hours as needed for cough. (Patient not taking: Reported on 12/16/2022) 115 mL 0   No facility-administered medications prior to visit.   Allergies  Allergen Reactions   Codeine Hives   Sulfa Antibiotics Hives and Itching     Objective:   Today's Vitals   12/16/22 1324  BP: 126/78  Pulse: 79  Temp: 98 F (36.7 C)  TempSrc: Temporal  SpO2: 95%  Weight: 144 lb 12.8 oz (65.7 kg)  Height: 5\' 2"  (1.575 m)   Body mass index is 26.48 kg/m.   General: Well developed, well nourished. No acute distress. HEENT: Normocephalic, non-traumatic. PERRL, EOMI. Conjunctiva clear. External ears normal. EAC and TMs normal   bilaterally. Nose clear without congestion or rhinorrhea. Mucous membranes moist. Mild mucous streakign of the posterior   oropharynx. Good dentition. Lungs: Clear to auscultation bilaterally. No wheezing, rales or rhonchi. Psych: Alert and oriented. Normal mood and affect.  Health Maintenance Due  Topic Date Due   Hepatitis C Screening  Never done   Zoster Vaccines- Shingrix (1 of 2) Never done    Assessment & Plan:   Problem List Items Addressed This Visit       Respiratory   Bronchitis - Primary    Kimberly [name] has had several episodes in the past year with significant cough/respiratory  illness. I am suspicious that she may have underlying COPD and that this actually represents an acute exacerbation. At this point, I will go ahead and prescribe a course of antibiotics. Iw ill plant os ee her back in 2 weeks to reassess and consider PFTS.      Relevant Medications   doxycycline (VIBRA-TABS) 100 MG tablet    Return in about 2 weeks (around 12/30/2022) for Reassessment.   Loyola Mast, MD

## 2022-12-16 NOTE — Assessment & Plan Note (Signed)
Kimberly [name] has had several episodes in the past year with significant cough/respiratory illness. I am suspicious that she may have underlying COPD and that this actually represents an acute exacerbation. At this point, I will go ahead and prescribe a course of antibiotics. Iw ill plant os ee her back in 2 weeks to reassess and consider PFTS.

## 2022-12-24 ENCOUNTER — Other Ambulatory Visit: Payer: Self-pay | Admitting: Family Medicine

## 2022-12-24 DIAGNOSIS — E039 Hypothyroidism, unspecified: Secondary | ICD-10-CM

## 2023-01-06 ENCOUNTER — Ambulatory Visit (INDEPENDENT_AMBULATORY_CARE_PROVIDER_SITE_OTHER): Payer: PPO | Admitting: Family Medicine

## 2023-01-06 VITALS — BP 134/80 | HR 76 | Temp 98.0°F | Ht 62.0 in | Wt 143.0 lb

## 2023-01-06 DIAGNOSIS — L308 Other specified dermatitis: Secondary | ICD-10-CM | POA: Diagnosis not present

## 2023-01-06 DIAGNOSIS — J40 Bronchitis, not specified as acute or chronic: Secondary | ICD-10-CM | POA: Diagnosis not present

## 2023-01-06 DIAGNOSIS — R300 Dysuria: Secondary | ICD-10-CM | POA: Diagnosis not present

## 2023-01-06 LAB — POCT URINALYSIS DIPSTICK
Bilirubin, UA: NEGATIVE
Glucose, UA: NEGATIVE
Ketones, UA: NEGATIVE
Nitrite, UA: NEGATIVE
Protein, UA: POSITIVE — AB
Spec Grav, UA: >=1.03 — AB (ref 1.010–1.025)
Urobilinogen, UA: 0.2 U/dL
pH, UA: 6 (ref 5.0–8.0)

## 2023-01-06 MED ORDER — TRIAMCINOLONE ACETONIDE 0.1 % EX CREA: 1.0000 | TOPICAL_CREAM | Freq: Two times a day (BID) | CUTANEOUS | 0 refills | Status: DC

## 2023-01-06 MED ORDER — NITROFURANTOIN MONOHYD MACRO 100 MG PO CAPS
100.0000 mg | ORAL_CAPSULE | Freq: Two times a day (BID) | ORAL | 0 refills | Status: DC
Start: 2023-01-06 — End: 2023-01-06

## 2023-01-06 MED ORDER — NITROFURANTOIN MONOHYD MACRO 100 MG PO CAPS
100.0000 mg | ORAL_CAPSULE | Freq: Two times a day (BID) | ORAL | 0 refills | Status: DC
Start: 2023-01-06 — End: 2023-02-04

## 2023-01-06 NOTE — Assessment & Plan Note (Signed)
Resolved. I remain concerned about potential underlying COPD. As she is currently on fluticasone-salmeterol [facility]) inhaler, we will monitor this for now.

## 2023-01-06 NOTE — Progress Notes (Signed)
[facility] [address] [place] [address] Dept: [phone] Dept Fax: [phone]  Office Visit  Subjective:    Patient ID: Kimberly [name], female    DOB: Jul 20, 1950, 72 y.o..   MRN: [id number]  Chief Complaint  Patient presents with   Follow-up    2 week f/u cough.  C/o having a rash on face x 1 week.  Nausea with semaglutide.     History of Present Illness:  Patient is in today for reassessment of her bronchitis. I had seen Kimberly [name] on 10/2 for follow-up from her recent cholecystectomy. She complained of a cough that started about 5 days after surgery. She noted she has been coughing up phlegm/mucous. She had no fever and was not dyspneic. I had treated her with a Tessalon and Tussionex. On follow-up on 10/14, she noted the sputum had changed in color and noted no lessening of her cough. Kimberly [name] has a past history of heavy tobacco use for ~ 24 years (2 ppd). She quit in 1995. I felt she likely had some acute exacerbation of chronic bronchitis, so treated her with a course of doxycycline. She now notes her cough has resolved and she is feeling better.  Kimberly [name] notes she has broken out with a rash on her cheeks. She is concerned this may be a side effect of her Wixella inhaler, which she has been using more recently.  Kimberly [name] has also had some recent dysuria symptoms. She notes this has been persistent for several days.  Past Medical History: Patient Active Problem List   Diagnosis Date Noted   Bronchitis 12/16/2022   Acute cough 12/04/2022   Preop respiratory exam 11/20/2022   Post-COVID chronic cough 08/13/2022   COVID-19 07/20/2022   SUI (stress urinary incontinence, female) 03/23/2021   Hiatal hernia 09/22/2020   GERD (gastroesophageal reflux disease) 09/22/2020   Diverticulosis 09/22/2020   Osteopenia 09/08/2020   Restless leg syndrome 06/05/2020   Hypothyroidism 03/29/2020   Essential  hypertension 03/29/2020   Kidney stones 03/29/2020   Asthma 03/29/2020   Anxiety disorder 03/29/2020   Past Surgical History:  Procedure Laterality Date   ANTERIOR AND POSTERIOR VAGINAL REPAIR W/ SACROSPINOUS LIGAMENT SUSPENSION  06/29/2021   CHOLECYSTECTOMY Bilateral 11/25/2022   Procedure: LAPAROSCOPIC CHOLECYSTECTOMY;  Surgeon: Diamantina Monks, MD;  Location: [facility] OR;  Service: General;  Laterality: Bilateral;   PARTIAL HYSTERECTOMY  1980   Pre-cancerous condition. Failed laser surgery. still have ovaries   UMBILICAL HERNIA REPAIR  2004   with mesh   WRIST SURGERY Left 2019   Family History  Problem Relation Age of Onset   Diabetes Mother    Breast cancer Mother    Colon cancer Father    Kidney failure Father    COPD Brother    Diabetes Brother    Heart disease Brother    Heart disease Maternal Uncle    Heart disease Daughter    Depression Son    Esophageal cancer Neg Hx    Stomach cancer Neg Hx    Rectal cancer Neg Hx    Outpatient Medications Prior to Visit  Medication Sig Dispense Refill   acetaminophen (TYLENOL) 500 MG tablet Take 2 tablets (1,000 mg total) by mouth 4 (four) times daily. 120 tablet 3   albuterol (VENTOLIN HFA) 108 (90 Base) MCG/ACT inhaler INHALE 2 PUFFS BY MOUTH EVERY 4 HOURS AS NEEDED 8.5 g 3   aspirin 81 MG chewable tablet Chew 81 mg by mouth daily.  benzonatate (TESSALON) 200 MG capsule Take 1 capsule (200 mg total) by mouth 2 (two) times daily as needed for cough. 20 capsule 2   Biotin 3 MG TABS Take by mouth.     buPROPion (WELLBUTRIN SR) 150 MG 12 hr tablet TAKE 1 TABLET(150 MG) BY MOUTH TWICE DAILY 180 tablet 3   chlorpheniramine-HYDROcodone (TUSSIONEX) 10-8 MG/5ML Take 5 mLs by mouth every 12 (twelve) hours as needed for cough. 115 mL 0   cholecalciferol (VITAMIN D3) 25 MCG (1000 UNIT) tablet Take 1,000 Units by mouth daily.     Cranberry 1000 MG CAPS Take 2 capsules by mouth daily.     diazepam (VALIUM) 5 MG tablet TAKE 1/2 TO 1 TABLET(2.5  TO 5 MG) BY MOUTH IN THE MORNING AND AT BEDTIME. MAY TAKE AN ADDITIONAL DOSE DAILY AS NEEDED (Patient taking differently: Take 2.5 mg by mouth as needed for muscle spasms.) 60 tablet 1   doxycycline (VIBRA-TABS) 100 MG tablet Take 1 tablet (100 mg total) by mouth 2 (two) times daily. 20 tablet 0   famotidine (PEPCID) 20 MG tablet Take 20 mg by mouth 2 (two) times daily.     fluticasone-salmeterol (WIXELA INHUB) 250-50 MCG/ACT AEPB Inhale 1 puff into the lungs in the morning and at bedtime. 60 each 3   ibuprofen (ADVIL) 600 MG tablet Take 1 tablet (600 mg total) by mouth 4 (four) times daily. 120 tablet 1   levothyroxine (SYNTHROID) 88 MCG tablet Take 1 tablet (88 mcg total) by mouth daily at 6 (six) AM. 90 tablet 3   methocarbamol (ROBAXIN-750) 750 MG tablet Take 1 tablet (750 mg total) by mouth 4 (four) times daily. 120 tablet 1   metoprolol succinate (TOPROL-XL) 25 MG 24 hr tablet TAKE 1 TABLET(25 MG) BY MOUTH DAILY 90 tablet 3   rOPINIRole (REQUIP) 0.5 MG tablet TAKE 1 TABLET(0.5 MG) BY MOUTH AT BEDTIME 30 tablet 5   docusate sodium (COLACE) 100 MG capsule Take 1 capsule (100 mg total) by mouth 2 (two) times daily. 60 capsule 2   fluticasone (FLONASE) 50 MCG/ACT nasal spray Place 1 spray into both nostrils daily.     oxyCODONE (ROXICODONE) 5 MG immediate release tablet Take 1 tablet (5 mg total) by mouth every 4 (four) hours as needed. 10 tablet 0   No facility-administered medications prior to visit.   Allergies  Allergen Reactions   Codeine Hives   Sulfa Antibiotics Hives and Itching     Objective:   Today's Vitals   01/06/23 0907  BP: 134/80  Pulse: 76  Temp: 98 F (36.7 C)  TempSrc: Temporal  SpO2: 99%  Weight: 143 lb (64.9 kg)  Height: 5\' 2"  (1.575 m)   Body mass index is 26.16 kg/m.   General: Well developed, well nourished. No acute distress. Skin: There are multiple small red papules with light scale on the cheeks, L>R. Psych: Alert and oriented. Normal mood and  affect.  Health Maintenance Due  Topic Date Due   Hepatitis C Screening  Never done   Zoster Vaccines- Shingrix (1 of 2) Never done   Lab Results Component Ref Range & Units 09:21  Color, UA yellow  Clarity, UA clear  Glucose, UA Negative Negative  Bilirubin, UA neg  Ketones, UA neg  Spec Grav, UA 1.010 - 1.025 >=1.030 Abnormal   Blood, UA 1+  pH, UA 5.0 - 8.0 6.0  Protein, UA Negative Positive Abnormal   Comment: 1+  Urobilinogen, UA 0.2 or 1.0 E.U./dL 0.2  Nitrite, UA  neg  Leukocytes, UA Negative Large (3+) Abnormal      Assessment & Plan:   Problem List Items Addressed This Visit       Respiratory   Bronchitis - Primary    Resolved. I remain concerned about potential underlying COPD. As she is currently on fluticasone-salmeterol [facility]) inhaler, we will monitor this for now.      Other Visit Diagnoses     Dysuria       Kimberly [name] was only able to produce a small amoun tof urine. I will treat her empirically with Macrobid for 5 days. Follow-up if symptoms recur or persist.   Relevant Medications   nitrofurantoin, macrocrystal-monohydrate, (MACROBID) 100 MG capsule   Other Relevant Orders   POCT Urinalysis Dipstick (Completed)   Other eczema       The rash appears eczematous. I will treat with a mild steroid cream.   Relevant Medications   triamcinolone cream (KENALOG) 0.1 %       Return in about 3 months (around 04/08/2023), or if symptoms worsen or fail to improve.   Loyola Mast, MD

## 2023-01-07 ENCOUNTER — Ambulatory Visit: Payer: PPO | Admitting: Family Medicine

## 2023-01-10 ENCOUNTER — Other Ambulatory Visit: Payer: Self-pay | Admitting: Family Medicine

## 2023-01-10 DIAGNOSIS — F411 Generalized anxiety disorder: Secondary | ICD-10-CM

## 2023-01-29 ENCOUNTER — Encounter: Payer: Self-pay | Admitting: Family Medicine

## 2023-01-29 DIAGNOSIS — R051 Acute cough: Secondary | ICD-10-CM

## 2023-02-04 ENCOUNTER — Encounter (HOSPITAL_BASED_OUTPATIENT_CLINIC_OR_DEPARTMENT_OTHER): Payer: Self-pay | Admitting: Pulmonary Disease

## 2023-02-04 ENCOUNTER — Ambulatory Visit (HOSPITAL_BASED_OUTPATIENT_CLINIC_OR_DEPARTMENT_OTHER): Payer: PPO | Admitting: Pulmonary Disease

## 2023-02-04 ENCOUNTER — Other Ambulatory Visit (HOSPITAL_COMMUNITY): Payer: Self-pay

## 2023-02-04 VITALS — BP 138/86 | HR 90 | Resp 16 | Ht 62.0 in | Wt 146.0 lb

## 2023-02-04 DIAGNOSIS — J45909 Unspecified asthma, uncomplicated: Secondary | ICD-10-CM

## 2023-02-04 MED ORDER — ADVAIR HFA 115-21 MCG/ACT IN AERO: 2.0000 | INHALATION_SPRAY | Freq: Two times a day (BID) | RESPIRATORY_TRACT | 5 refills | Status: DC

## 2023-02-04 MED ORDER — AIRSUPRA 90-80 MCG/ACT IN AERO: 1.0000 | INHALATION_SPRAY | Freq: Two times a day (BID) | RESPIRATORY_TRACT | Status: DC

## 2023-02-04 NOTE — Addendum Note (Signed)
Addended by: Jama Flavors on: 02/04/2023 09:41 AM   Modules accepted: Orders

## 2023-02-04 NOTE — Progress Notes (Signed)
Subjective:   PATIENT ID: Kimberly [name] GENDER: female DOB: 1950-05-25, MRN: [id number]  Chief Complaint  Patient presents with   Follow-up    Doesn't like wixella broke her out, likes [employer] but can't afford it. Asking for samples.     Reason for Visit: Follow-up  [name] is 72 year old female former smoker with asthmatic bronchitis, HTN, hypothyroidism, barrett esophagus, GERD, hx uterine cancer s/p partial hysterectomy, RLS who presents for follow-up  Initial consult Recent hospitalization for COVID from 07/19/22-07/22/22. Treated with decadron. Her cough has improved but still having two episodes lasting 15 min a day associated with thick sputum. Some wheezing associated. Has shortness of breath with long distances. Worse with heat. Improves with air conditioning and tussionex. Has had chest pain post cough that is significant  She reports longstanding history of asthmatic bronchitis/asthma since childhood. Hx of hives.Reports annual URI. Never had a hospitalization related to lung issues until this year. [place].  02/04/23 Since our last visit she had resolved cough. Had relief from [place] but could not afford that or [place]. Also felt like [place] caused her to break our on her face/mouth. She reports wheezing and worsening shortness of breath that began a few weeks ago. Denies fevers/chills. No productive cough.  Social History: Former smoker. Quit in 1995/1996 Retired Audiological scientist estate business Drinks 4 glasses a month  Past Medical History:  Diagnosis Date   Anxiety    Asthma    Barrett esophagus    Chronic headaches    Colon polyps    COVID 03/2020   Depression    GERD (gastroesophageal reflux disease)    Hypertension    Kidney stones    Pneumonia    Thyroid disease    Uterine cancer (HCC) 1980     Family History  Problem Relation Age of Onset   Diabetes Mother    Breast cancer Mother    Colon cancer Father     Kidney failure Father    COPD Brother    Diabetes Brother    Heart disease Brother    Heart disease Maternal Uncle    Heart disease Daughter    Depression Son    Esophageal cancer Neg Hx    Stomach cancer Neg Hx    Rectal cancer Neg Hx      Social History   Occupational History   Occupation: retired    Comment: Retired  Tobacco Use   Smoking status: Former    Current packs/day: 0.00    Types: Cigarettes    Quit date: 1995    Years since quitting: 29.9   Smokeless tobacco: Never  Vaping Use   Vaping status: Never Used  Substance and Sexual Activity   Alcohol use: Not Currently    Alcohol/week: 1.0 standard drink of alcohol    Types: 1 Cans of beer per week    Comment: 1 week   Drug use: Never   Sexual activity: Not Currently    Birth control/protection: Surgical    Comment: Hyst    Allergies  Allergen Reactions   Codeine Hives   Sulfa Antibiotics Hives and Itching     Outpatient Medications Prior to Visit  Medication Sig Dispense Refill   acetaminophen (TYLENOL) 500 MG tablet Take 2 tablets (1,000 mg total) by mouth 4 (four) times daily. 120 tablet 3   albuterol (VENTOLIN HFA) 108 (90 Base) MCG/ACT inhaler INHALE 2 PUFFS BY MOUTH EVERY 4 HOURS AS NEEDED 8.5 g  3   aspirin 81 MG chewable tablet Chew 81 mg by mouth daily.     benzonatate (TESSALON) 200 MG capsule Take 1 capsule (200 mg total) by mouth 2 (two) times daily as needed for cough. 20 capsule 2   Biotin 3 MG TABS Take by mouth.     buPROPion (WELLBUTRIN SR) 150 MG 12 hr tablet TAKE 1 TABLET(150 MG) BY MOUTH TWICE DAILY 180 tablet 3   cholecalciferol (VITAMIN D3) 25 MCG (1000 UNIT) tablet Take 1,000 Units by mouth daily.     Cranberry 1000 MG CAPS Take 2 capsules by mouth daily.     diazepam (VALIUM) 5 MG tablet TAKE 1/2 TO 1 TABLET BY MOUTH IN THE MORNING AND AT BEDTIME. MAY TAKE AN ADDITIONAL DOSE DAILY AS NEEDED 60 tablet 2   famotidine (PEPCID) 20 MG tablet Take 20 mg by mouth 2 (two) times daily.      levothyroxine (SYNTHROID) 88 MCG tablet Take 1 tablet (88 mcg total) by mouth daily at 6 (six) AM. 90 tablet 3   metoprolol succinate (TOPROL-XL) 25 MG 24 hr tablet TAKE 1 TABLET(25 MG) BY MOUTH DAILY 90 tablet 3   rOPINIRole (REQUIP) 0.5 MG tablet TAKE 1 TABLET(0.5 MG) BY MOUTH AT BEDTIME 30 tablet 5   triamcinolone cream (KENALOG) 0.1 % Apply 1 Application topically 2 (two) times daily. 30 g 0   chlorpheniramine-HYDROcodone (TUSSIONEX) 10-8 MG/5ML Take 5 mLs by mouth every 12 (twelve) hours as needed for cough. (Patient not taking: Reported on 02/04/2023) 115 mL 0   doxycycline (VIBRA-TABS) 100 MG tablet Take 1 tablet (100 mg total) by mouth 2 (two) times daily. 20 tablet 0   fluticasone-salmeterol (WIXELA INHUB) 250-50 MCG/ACT AEPB Inhale 1 puff into the lungs in the morning and at bedtime. (Patient not taking: Reported on 02/04/2023) 60 each 3   ibuprofen (ADVIL) 600 MG tablet Take 1 tablet (600 mg total) by mouth 4 (four) times daily. 120 tablet 1   methocarbamol (ROBAXIN-750) 750 MG tablet Take 1 tablet (750 mg total) by mouth 4 (four) times daily. 120 tablet 1   nitrofurantoin, macrocrystal-monohydrate, (MACROBID) 100 MG capsule Take 1 capsule (100 mg total) by mouth 2 (two) times daily. 10 capsule 0   No facility-administered medications prior to visit.    ROS   Objective:   Vitals:   02/04/23 0858  BP: 138/86  Pulse: 90  Resp: 16  SpO2: 99%  Weight: 146 lb (66.2 kg)  Height: 5\' 2"  (1.575 m)   SpO2: 99 %  Physical Exam: General: Well-appearing, no acute distress HENT: [place], AT Eyes: EOMI, no scleral icterus Respiratory: Clear to auscultation bilaterally.  No crackles, wheezing or rales Cardiovascular: RRR, -M/R/G, no JVD Extremities:-Edema,-tenderness Neuro: AAO x4, CNII-XII grossly intact Psych: Normal mood, normal affect  Data Reviewed:  Imaging: CXR 08/13/22 - No infiltrate of effusion or edema  PFT: None on file  Labs: CBC    Component Value Date/Time   WBC  6.4 11/21/2022 1000   RBC 4.59 11/21/2022 1000   HGB 12.6 11/21/2022 1000   HCT 40.2 11/21/2022 1000   PLT 179 11/21/2022 1000   MCV 87.6 11/21/2022 1000   MCH 27.5 11/21/2022 1000   MCHC 31.3 11/21/2022 1000   RDW 12.4 11/21/2022 1000   LYMPHSABS 2.3 08/13/2022 1331   MONOABS 0.5 08/13/2022 1331   EOSABS 0.3 08/13/2022 1331   BASOSABS 0.0 08/13/2022 1331        Assessment & Plan:   Discussion: 72 year old female  former smoker with asthmatic bronchitis, HTN, hypothyroidism, barrett esophagus, GERD, hx uterine cancer s/p partial hysterectomy, RLS who presents for follow-up for asthmatic bronchitis. Previously seen for asthmatic bronchitis likely exacerbated by recent covid infection. Symptoms resolved on [place]. Now cannot tolerate powder inhalers due to rash.  Will transition to MDI ICS/LABA  Asthmatic bronchitis Post- covid bronchitis --Provided AirSupra sample. Take 1-2 puffs in the morning and evening. Take this until you run out --START Brand name Advair HFA TWO puffs in the morning and evening. Rinse mouth out after use.  Health Maintenance Immunization History  Administered Date(s) Administered   Moderna Covid-19 Vaccine Bivalent Booster 29yrs & up 12/26/2020   PFIZER Comirnaty(Gray Top)Covid-19 Tri-Sucrose Vaccine 05/12/2019, 06/13/2019   Td 03/04/2016   CT Lung Screen - not qualified. Quit >15 years ago  No orders of the defined types were placed in this encounter.  Meds ordered this encounter  Medications   ADVAIR HFA 115-21 MCG/ACT inhaler    Sig: Inhale 2 puffs into the lungs 2 (two) times daily.    Dispense:  1 each    Refill:  5    Brand name only    Return in about 5 months (around 07/05/2023).  I have spent a total time of 30-minutes on the day of the appointment including chart review, data review, collecting history, coordinating care and discussing medical diagnosis and plan with the patient/family. Past medical history, allergies,  medications were reviewed. Pertinent imaging, labs and tests included in this note have been reviewed and interpreted independently by me.  Lynx Goodrich Mechele Collin, MD [facility] 02/04/2023 9:28 AM  Office Number [phone]

## 2023-02-04 NOTE — Patient Instructions (Signed)
Asthmatic bronchitis Post- covid bronchitis --Provided AirSupra sample. Take 1-2 puffs in the morning and evening. Take this until you run out --START Brand name Advair HFA TWO puffs in the morning and evening. Rinse mouth out after use.

## 2023-03-11 ENCOUNTER — Other Ambulatory Visit: Payer: Self-pay | Admitting: Family Medicine

## 2023-03-11 DIAGNOSIS — G2581 Restless legs syndrome: Secondary | ICD-10-CM

## 2023-03-17 ENCOUNTER — Ambulatory Visit (INDEPENDENT_AMBULATORY_CARE_PROVIDER_SITE_OTHER): Payer: PPO

## 2023-03-17 VITALS — Wt 139.4 lb

## 2023-03-17 DIAGNOSIS — Z0000 Encounter for general adult medical examination without abnormal findings: Secondary | ICD-10-CM

## 2023-03-17 NOTE — Patient Instructions (Signed)
 Ms. Credeur , Thank you for taking time to come for your Medicare Wellness Visit. I appreciate your ongoing commitment to your health goals. Please review the following plan we discussed and let me know if I can assist you in the future.   Referrals/Orders/Follow-Ups/Clinician Recommendations: none  This is a list of the screening recommended for you and due dates:  Health Maintenance  Topic Date Due   Hepatitis C Screening  Never done   Pneumonia Vaccine (1 of 2 - PCV) 03/19/2023*   COVID-19 Vaccine (4 - 2024-25 season) 04/02/2023*   Flu Shot  06/02/2023*   Zoster (Shingles) Vaccine (1 of 2) 06/15/2023*   Mammogram  05/23/2023   Medicare Annual Wellness Visit  03/16/2024   Colon Cancer Screening  09/22/2025   DTaP/Tdap/Td vaccine (2 - Tdap) 03/04/2026   DEXA scan (bone density measurement)  Completed   HPV Vaccine  Aged Out  *Topic was postponed. The date shown is not the original due date.    Advanced directives: (Copy Requested) Please bring a copy of your health care power of attorney and living will to the office to be added to your chart at your convenience.  Next Medicare Annual Wellness Visit scheduled for next year: Yes  insert Preventive Care attachment Insert FALL PREVENTION attachment if needed

## 2023-03-17 NOTE — Progress Notes (Signed)
 Subjective:   Kimberly [name] is a 73 y.o. female who presents for Medicare Annual (Subsequent) preventive examination.  Visit Complete: Virtual I connected with  Kimberly [name] on 03/17/23 by a audio enabled telemedicine application and verified that I am speaking with the correct person using two identifiers.  Interactive audio and video telecommunications were attempted between this provider and patient, however failed, due to patient having technical difficulties OR patient did not have access to video capability.  We continued and completed visit with audio only.   Patient Location: Home  Provider Location: Office/Clinic  I discussed the limitations of evaluation and management by telemedicine. The patient expressed understanding and agreed to proceed.  Vital Signs: Because this visit was a virtual/telehealth visit, some criteria may be missing or patient reported. Any vitals not documented were not able to be obtained and vitals that have been documented are patient reported.  Patient Medicare AWV questionnaire was completed by the patient on 03/11/2023; I have confirmed that all information answered by patient is correct and no changes since this date.  Cardiac Risk Factors include: advanced age (>9men, >42 women);hypertension     Objective:    Today's Vitals   03/17/23 1045  Weight: 139 lb 6.4 oz (63.2 kg)   Body mass index is 25.5 kg/m.     03/17/2023   10:57 AM 11/25/2022    2:06 PM 11/21/2022    9:14 AM 07/20/2022    6:00 AM 03/18/2022   10:34 AM 03/14/2021    9:53 AM  Advanced Directives  Does Patient Have a Medical Advance Directive? Yes Yes No Yes Yes Yes  Type of Estate Agent of [place];Living will Healthcare Power of [place];Living will  Living will Healthcare Power of [place];Living will Healthcare Power of [place];Living will  Does patient want to make changes to medical advance directive?    No - Patient declined    Copy of Healthcare  Power of Attorney in Chart? No - copy requested No - copy requested   No - copy requested No - copy requested  Would patient like information on creating a medical advance directive?   No - Patient declined       Current Medications (verified) Outpatient Encounter Medications as of 03/17/2023  Medication Sig   ADVAIR  HFA 115-21 MCG/ACT inhaler Inhale 2 puffs into the lungs 2 (two) times daily.   albuterol  (VENTOLIN  HFA) 108 (90 Base) MCG/ACT inhaler INHALE 2 PUFFS BY MOUTH EVERY 4 HOURS AS NEEDED   aspirin  81 MG chewable tablet Chew 81 mg by mouth daily.   benzonatate  (TESSALON ) 200 MG capsule Take 1 capsule (200 mg total) by mouth 2 (two) times daily as needed for cough.   Biotin 3 MG TABS Take by mouth.   buPROPion  (WELLBUTRIN  SR) 150 MG 12 hr tablet TAKE 1 TABLET(150 MG) BY MOUTH TWICE DAILY   cholecalciferol (VITAMIN D3) 25 MCG (1000 UNIT) tablet Take 1,000 Units by mouth daily.   Cranberry 1000 MG CAPS Take 2 capsules by mouth daily.   diazepam  (VALIUM ) 5 MG tablet TAKE 1/2 TO 1 TABLET BY MOUTH IN THE MORNING AND AT BEDTIME. MAY TAKE AN ADDITIONAL DOSE DAILY AS NEEDED   famotidine (PEPCID) 20 MG tablet Take 20 mg by mouth 2 (two) times daily.   levothyroxine  (SYNTHROID ) 88 MCG tablet Take 1 tablet (88 mcg total) by mouth daily at 6 (six) AM.   metoprolol  succinate (TOPROL -XL) 25 MG 24 hr tablet TAKE 1 TABLET(25 MG) BY MOUTH DAILY  rOPINIRole  (REQUIP ) 0.5 MG tablet TAKE 1 TABLET(0.5 MG) BY MOUTH AT BEDTIME   acetaminophen  (TYLENOL ) 500 MG tablet Take 2 tablets (1,000 mg total) by mouth 4 (four) times daily. (Patient not taking: Reported on 03/17/2023)   Albuterol -Budesonide  (AIRSUPRA ) 90-80 MCG/ACT AERO Inhale 1 puff into the lungs in the morning and at bedtime. (Patient not taking: Reported on 03/17/2023)   chlorpheniramine-HYDROcodone  (TUSSIONEX) 10-8 MG/5ML Take 5 mLs by mouth every 12 (twelve) hours as needed for cough. (Patient not taking: Reported on 03/17/2023)   triamcinolone  cream  (KENALOG ) 0.1 % Apply 1 Application topically 2 (two) times daily. (Patient not taking: Reported on 03/17/2023)   No facility-administered encounter medications on file as of 03/17/2023.    Allergies (verified) Codeine and Sulfa antibiotics   History: Past Medical History:  Diagnosis Date   Anxiety    Asthma    Barrett esophagus    Chronic headaches    Colon polyps    COVID 03/2020   Depression    GERD (gastroesophageal reflux disease)    Hypertension    Kidney stones    Pneumonia    Thyroid  disease    Uterine cancer (HCC) 1980   Past Surgical History:  Procedure Laterality Date   ANTERIOR AND POSTERIOR VAGINAL REPAIR W/ SACROSPINOUS LIGAMENT SUSPENSION  06/29/2021   CHOLECYSTECTOMY Bilateral 11/25/2022   Procedure: LAPAROSCOPIC CHOLECYSTECTOMY;  Surgeon: Paola Dreama SAILOR, MD;  Location: [facility] OR;  Service: General;  Laterality: Bilateral;   PARTIAL HYSTERECTOMY  1980   Pre-cancerous condition. Failed laser surgery. still have ovaries   UMBILICAL HERNIA REPAIR  2004   with mesh   WRIST SURGERY Left 2019   Family History  Problem Relation Age of Onset   Diabetes Mother    Breast cancer Mother    Colon cancer Father    Kidney failure Father    COPD Brother    Diabetes Brother    Heart disease Brother    Heart disease Maternal Uncle    Heart disease Daughter    Depression Son    Esophageal cancer Neg Hx    Stomach cancer Neg Hx    Rectal cancer Neg Hx    Social History   Socioeconomic History   Marital status: Widowed    Spouse name: Kimberly [name]   Number of children: 2   Years of education: 12 years   Highest education level: Associate degree: occupational, scientist, product/process development, or vocational program  Occupational History   Occupation: retired    Comment: Retired  Tobacco Use   Smoking status: Former    Current packs/day: 0.00    Types: Cigarettes    Quit date: 1995    Years since quitting: 30.0   Smokeless tobacco: Never  Vaping Use   Vaping status: Never Used   Substance and Sexual Activity   Alcohol  use: Not Currently    Alcohol /week: 1.0 standard drink of alcohol     Types: 1 Cans of beer per week    Comment: 1 week   Drug use: Never   Sexual activity: Not Currently    Birth control/protection: Surgical    Comment: Hyst  Other Topics Concern   Not on file  Social History Narrative   Recently moved from [place]  to [place]. 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   Social Drivers of Corporate Investment Banker Strain: Low Risk  (03/17/2023)   Overall Financial Resource Strain (CARDIA)    Difficulty of Paying Living Expenses: Not hard at all  Food Insecurity: No Food  Insecurity (03/17/2023)   Hunger Vital Sign    Worried About Running Out of Food in the Last Year: Never true    Ran Out of Food in the Last Year: Never true  Transportation Needs: No Transportation Needs (03/17/2023)   PRAPARE - Administrator, Civil Service (Medical): No    Lack of Transportation (Non-Medical): No  Physical Activity: Inactive (03/17/2023)   Exercise Vital Sign    Days of Exercise per Week: 0 days    Minutes of Exercise per Session: 0 min  Stress: No Stress Concern Present (03/17/2023)   [employer] of Occupational Health - Occupational Stress Questionnaire    Feeling of Stress : Only a little  Social Connections: Socially Isolated (03/17/2023)   Social Connection and Isolation Panel [NHANES]    Frequency of Communication with Friends and Family: Once a week    Frequency of Social Gatherings with Friends and Family: Once a week    Attends Religious Services: Never    Database Administrator or Organizations: No    Attends Banker Meetings: Never    Marital Status: Widowed    Tobacco Counseling Counseling given: Not Answered   Clinical Intake:  Pre-visit preparation completed: Yes  Pain : No/denies pain     Nutritional Risks: Nausea/ vomitting/ diarrhea (nausea on and off) Diabetes: No  How  often do you need to have someone help you when you read instructions, pamphlets, or other written materials from your doctor or pharmacy?: 1 - Never  Interpreter Needed?: No  Information entered by :: NAllen LPN   Activities of Daily Living    03/11/2023    4:38 AM 11/21/2022    9:26 AM  In your present state of health, do you have any difficulty performing the following activities:  Hearing? 0   Vision? 0   Difficulty concentrating or making decisions? 0   Walking or climbing stairs? 0   Dressing or bathing? 0   Doing errands, shopping? 0 0  Preparing Food and eating ? N   Using the Toilet? N   In the past six months, have you accidently leaked urine? Y   Comment possibly due to UTIs   Do you have problems with loss of bowel control? N   Managing your Medications? N   Managing your Finances? N   Housekeeping or managing your Housekeeping? N     Patient Care Team: Thedora Garnette HERO, MD as PCP - General (Family Medicine) Cathlyn JAYSON Nikki Bobie FORBES, MD as Consulting Physician (Obstetrics and Gynecology) Roz Anes, MD as Consulting Physician (Ophthalmology) Abran Norleen SAILOR, MD as Consulting Physician (Gastroenterology) Jerri Kay HERO, MD as Attending Physician (Orthopedic Surgery) Alvia Dorothyann LABOR, MD as Referring Physician (Gynecology)  Indicate any recent Medical Services you may have received from other than [facility] providers in the past year (date may be approximate).     Assessment:   This is a routine wellness examination for Navjot.  Hearing/Vision screen Hearing Screening - Comments:: Denies hearing issues Vision Screening - Comments:: Regular eye exams,    Goals Addressed             This Visit's Progress    Patient Stated       03/17/2023, keep weight down       Depression Screen    03/17/2023   11:00 AM 03/18/2022   10:35 AM 03/14/2021    9:54 AM 03/14/2021    9:50 AM 03/29/2020   10:57 AM  PHQ  2/9 Scores  PHQ - 2 Score 0 0 0 0 0    Fall Risk     03/11/2023    4:38 AM 03/18/2022   10:34 AM 03/14/2022   10:34 AM 03/14/2021    9:54 AM 10/12/2020    8:06 AM  Fall Risk   Falls in the past year? 0 0 0 1 0  Number falls in past yr: 0 0 0 1 0  Injury with Fall? 0 0 0 1 0  Comment    bruises   Risk for fall due to : Medication side effect Medication side effect   No Fall Risks  Follow up Falls prevention discussed;Falls evaluation completed Falls prevention discussed;Education provided;Falls evaluation completed       MEDICARE RISK AT HOME: Medicare Risk at Home Any stairs in or around the home?: (Patient-Rptd) Yes If so, are there any without handrails?: (Patient-Rptd) Yes Home free of loose throw rugs in walkways, pet beds, electrical cords, etc?: (Patient-Rptd) No Adequate lighting in your home to reduce risk of falls?: (Patient-Rptd) Yes Life alert?: (Patient-Rptd) No Use of a cane, walker or w/c?: (Patient-Rptd) No Grab bars in the bathroom?: (Patient-Rptd) Yes Shower chair or bench in shower?: (Patient-Rptd) Yes Elevated toilet seat or a handicapped toilet?: (Patient-Rptd) Yes  TIMED UP AND GO:  Was the test performed?  No    Cognitive Function:        03/17/2023   11:00 AM 03/18/2022   10:36 AM  6CIT Screen  What Year? 0 points 0 points  What month? 0 points 0 points  What time? 0 points 0 points  Count back from 20 0 points 0 points  Months in reverse 0 points 0 points  Repeat phrase 0 points 0 points  Total Score 0 points 0 points    Immunizations Immunization History  Administered Date(s) Administered   Moderna Covid-19 Vaccine Bivalent Booster 62yrs & up 12/26/2020   PFIZER Comirnaty(Gray Top)Covid-19 Tri-Sucrose Vaccine 05/12/2019, 06/13/2019   Td 03/04/2016    TDAP status: Up to date  Flu Vaccine status: Declined, Education has been provided regarding the importance of this vaccine but patient still declined. Advised may receive this vaccine at local pharmacy or Health Dept. Aware to provide a copy of  the vaccination record if obtained from local pharmacy or Health Dept. Verbalized acceptance and understanding.  Pneumococcal vaccine status: Declined,  Education has been provided regarding the importance of this vaccine but patient still declined. Advised may receive this vaccine at local pharmacy or Health Dept. Aware to provide a copy of the vaccination record if obtained from local pharmacy or Health Dept. Verbalized acceptance and understanding.   Covid-19 vaccine status: Declined, Education has been provided regarding the importance of this vaccine but patient still declined. Advised may receive this vaccine at local pharmacy or Health Dept.or vaccine clinic. Aware to provide a copy of the vaccination record if obtained from local pharmacy or Health Dept. Verbalized acceptance and understanding.  Qualifies for Shingles Vaccine? Yes   Zostavax completed No   Shingrix Completed?: No.    Education has been provided regarding the importance of this vaccine. Patient has been advised to call insurance company to determine out of pocket expense if they have not yet received this vaccine. Advised may also receive vaccine at local pharmacy or Health Dept. Verbalized acceptance and understanding.  Screening Tests Health Maintenance  Topic Date Due   Hepatitis C Screening  Never done   Pneumonia Vaccine 91+ Years old (1 of  2 - PCV) 03/19/2023 (Originally 11/10/1956)   COVID-19 Vaccine (4 - 2024-25 season) 04/02/2023 (Originally 11/03/2022)   INFLUENZA VACCINE  06/02/2023 (Originally 10/03/2022)   Zoster Vaccines- Shingrix (1 of 2) 06/15/2023 (Originally 11/11/1969)   MAMMOGRAM  05/23/2023   Medicare Annual Wellness (AWV)  03/16/2024   Colonoscopy  09/22/2025   DTaP/Tdap/Td (2 - Tdap) 03/04/2026   DEXA SCAN  Completed   HPV VACCINES  Aged Out    Health Maintenance  Health Maintenance Due  Topic Date Due   Hepatitis C Screening  Never done    Colorectal cancer screening: Type of screening:  Colonoscopy. Completed 09/22/2020. Repeat every 5 years  Mammogram status: Completed 05/14/2022. Repeat every year  Bone Density status: Completed 09/06/2020.   Lung Cancer Screening: (Low Dose CT Chest recommended if Age 42-80 years, 20 pack-year currently smoking OR have quit w/in 15years.) does not qualify.   Lung Cancer Screening Referral: no  Additional Screening:  Hepatitis C Screening: does qualify;   Vision Screening: Recommended annual ophthalmology exams for early detection of glaucoma and other disorders of the eye. Is the patient up to date with their annual eye exam?  Yes  Who is the provider or what is the name of the office in which the patient attends annual eye exams? Seeing new eye doctor If pt is not established with a provider, would they like to be referred to a provider to establish care? No .   Dental Screening: Recommended annual dental exams for proper oral hygiene  Diabetic Foot Exam: n/a  Community Resource Referral / Chronic Care Management: CRR required this visit?  No   CCM required this visit?  No     Plan:     I have personally reviewed and noted the following in the patient's chart:   Medical and social history Use of alcohol , tobacco or illicit drugs  Current medications and supplements including opioid prescriptions. Patient is not currently taking opioid prescriptions. Functional ability and status Nutritional status Physical activity Advanced directives List of other physicians Hospitalizations, surgeries, and ER visits in previous 12 months Vitals Screenings to include cognitive, depression, and falls Referrals and appointments  In addition, I have reviewed and discussed with patient certain preventive protocols, quality metrics, and best practice recommendations. A written personalized care plan for preventive services as well as general preventive health recommendations were provided to patient.     Ardella FORBES Dawn,  LPN   [id number]/[id number]   After Visit Summary: (MyChart) Due to this being a telephonic visit, the after visit summary with patients personalized plan was offered to patient via MyChart   Nurse Notes: none

## 2023-04-01 ENCOUNTER — Other Ambulatory Visit: Payer: Self-pay | Admitting: Family Medicine

## 2023-04-01 DIAGNOSIS — R051 Acute cough: Secondary | ICD-10-CM

## 2023-04-02 ENCOUNTER — Other Ambulatory Visit: Payer: Self-pay | Admitting: Family Medicine

## 2023-04-02 DIAGNOSIS — Z1231 Encounter for screening mammogram for malignant neoplasm of breast: Secondary | ICD-10-CM

## 2023-04-02 NOTE — Telephone Encounter (Signed)
Lft VM to rtn call. Dm/cma

## 2023-04-03 ENCOUNTER — Encounter: Payer: Self-pay | Admitting: Family Medicine

## 2023-04-08 ENCOUNTER — Ambulatory Visit: Payer: PPO | Admitting: Family Medicine

## 2023-04-08 ENCOUNTER — Encounter: Payer: Self-pay | Admitting: Family Medicine

## 2023-04-08 VITALS — BP 128/84 | HR 72 | Temp 98.0°F | Ht 62.0 in | Wt 142.4 lb

## 2023-04-08 DIAGNOSIS — H6123 Impacted cerumen, bilateral: Secondary | ICD-10-CM

## 2023-04-08 DIAGNOSIS — I10 Essential (primary) hypertension: Secondary | ICD-10-CM | POA: Diagnosis not present

## 2023-04-08 DIAGNOSIS — J4489 Other specified chronic obstructive pulmonary disease: Secondary | ICD-10-CM

## 2023-04-08 DIAGNOSIS — E039 Hypothyroidism, unspecified: Secondary | ICD-10-CM

## 2023-04-08 MED ORDER — BENZONATATE 200 MG PO CAPS
200.0000 mg | ORAL_CAPSULE | Freq: Two times a day (BID) | ORAL | 2 refills | Status: DC | PRN
Start: 2023-04-08 — End: 2023-10-13

## 2023-04-08 NOTE — Assessment & Plan Note (Signed)
I recommend she use her Advair inhaler daily to help prevent flares of her asthma. I will renew her benzonatate for PRN use.

## 2023-04-08 NOTE — Assessment & Plan Note (Signed)
 Blood pressure is in good control. Continue metoprolol succinate 25 mg daily.

## 2023-04-08 NOTE — Assessment & Plan Note (Signed)
TSH was low at last check. I missed the opportunity to reassess this today. Continue 88 mcg daily. We will recheck her thyroid at her next visit.

## 2023-04-08 NOTE — Progress Notes (Signed)
 [facility] [address] [place] [address] Dept: [phone] Dept Fax: [phone]  Chronic Care Office Visit  Subjective:    Patient ID: Kimberly [name], female    DOB: [birth year], 73 y.o..   MRN: [id number]  Chief Complaint  Patient presents with   Follow-up    3 month f/u.  No concerns.     History of Present Illness:  Patient is in today for reassessment of chronic medical issues.  [name] has a history of essential hypertension. She is managed on metoprolol  succinate 25 mg daily.   [name] was seen by Kassie (pulmonology) in December related to her chronic cough. Although [name] had felt this was a post-bronchitic cough, she had similar issues after COVID and other episodes of viral illness. Dr. Kassie feels this as an asthmatic bronchitis due to prior asthma. [name] had improvement with the use of Airsupra  but has found this too expensive to continue. She was prescribed Advair , but admits she is not using this daily.  [name] has a history of hypothyroidism. She is managed on levothyroxine  88 mcg daily.  [name] notes her hearing has been decreased. She has tried to flush wax out of her ears, but is not sure how successful this has been.  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   COVID-19 07/20/2022   SUI (stress urinary incontinence, female) 03/23/2021   Hiatal hernia 09/22/2020   GERD (gastroesophageal reflux disease) 09/22/2020   Diverticulosis 09/22/2020   Osteopenia 09/08/2020   Restless leg syndrome 06/05/2020   Hypothyroidism 03/29/2020   Essential hypertension 03/29/2020   Kidney stones 03/29/2020   Asthmatic bronchitis , chronic (HCC) 03/29/2020   Anxiety disorder 03/29/2020   Past Surgical History:  Procedure Laterality Date   ANTERIOR AND  POSTERIOR VAGINAL REPAIR W/ SACROSPINOUS LIGAMENT SUSPENSION  06/29/2021   CHOLECYSTECTOMY Bilateral 11/25/2022   Procedure: LAPAROSCOPIC CHOLECYSTECTOMY;  Surgeon: Paola Dreama SAILOR, MD;  Location: [facility] OR;  Service: General;  Laterality: Bilateral;   PARTIAL HYSTERECTOMY  1980   Pre-cancerous condition. Failed laser surgery. still have ovaries   UMBILICAL HERNIA REPAIR  2004   with mesh   WRIST SURGERY Left 2019   Family History  Problem Relation Age of Onset   Diabetes Mother    Breast cancer Mother    Colon cancer Father    Kidney failure Father    COPD Brother    Diabetes Brother    Heart disease Brother    Heart disease Maternal Uncle    Heart disease Daughter    Depression Son    Esophageal cancer Neg Hx    Stomach cancer Neg Hx    Rectal cancer Neg Hx    Outpatient Medications Prior to Visit  Medication Sig Dispense Refill   acetaminophen  (TYLENOL ) 500 MG tablet Take 2 tablets (1,000 mg total) by mouth 4 (four) times daily. 120 tablet 3   ADVAIR  HFA 115-21 MCG/ACT inhaler Inhale 2 puffs into the lungs 2 (two) times daily. 1 each 5   albuterol  (VENTOLIN  HFA) 108 (90 Base) MCG/ACT inhaler INHALE 2 PUFFS BY MOUTH EVERY 4 HOURS AS NEEDED 8.5 g 3   aspirin  81 MG chewable tablet Chew 81 mg by mouth daily.     buPROPion  (WELLBUTRIN  SR) 150 MG 12 hr tablet TAKE 1 TABLET(150 MG) BY MOUTH TWICE DAILY 180 tablet 3  chlorpheniramine-HYDROcodone  (TUSSIONEX) 10-8 MG/5ML Take 5 mLs by mouth every 12 (twelve) hours as needed for cough. 115 mL 0   cholecalciferol (VITAMIN D3) 25 MCG (1000 UNIT) tablet Take 1,000 Units by mouth daily.     Cranberry 1000 MG CAPS Take 2 capsules by mouth daily.     diazepam  (VALIUM ) 5 MG tablet TAKE 1/2 TO 1 TABLET BY MOUTH IN THE MORNING AND AT BEDTIME. MAY TAKE AN ADDITIONAL DOSE DAILY AS NEEDED 60 tablet 2   famotidine (PEPCID) 20 MG tablet Take 20 mg by mouth 2 (two) times daily.     ibuprofen  (ADVIL ) 600 MG tablet Take 600 mg by mouth every 6 (six) hours  as needed.     levothyroxine  (SYNTHROID ) 88 MCG tablet Take 1 tablet (88 mcg total) by mouth daily at 6 (six) AM. 90 tablet 3   metoprolol  succinate (TOPROL -XL) 25 MG 24 hr tablet TAKE 1 TABLET(25 MG) BY MOUTH DAILY 90 tablet 3   phentermine (ADIPEX-P) 37.5 MG tablet Take 37.5 mg by mouth daily.     rOPINIRole  (REQUIP ) 0.5 MG tablet TAKE 1 TABLET(0.5 MG) BY MOUTH AT BEDTIME 30 tablet 5   triamcinolone  cream (KENALOG ) 0.1 % Apply 1 Application topically 2 (two) times daily. 30 g 0   benzonatate  (TESSALON ) 200 MG capsule Take 1 capsule (200 mg total) by mouth 2 (two) times daily as needed for cough. 20 capsule 2   Albuterol -Budesonide  (AIRSUPRA ) 90-80 MCG/ACT AERO Inhale 1 puff into the lungs in the morning and at bedtime. (Patient not taking: Reported on 04/08/2023)     Biotin 3 MG TABS Take by mouth.     No facility-administered medications prior to visit.   Allergies  Allergen Reactions   Codeine Hives   Sulfa Antibiotics Hives and Itching   Objective:   Today's Vitals   04/08/23 1001  BP: 128/84  Pulse: 72  Temp: 98 F (36.7 C)  TempSrc: Temporal  SpO2: 98%  Weight: 142 lb 6.4 oz (64.6 kg)  Height: 5' 2 (1.575 m)   Body mass index is 26.05 kg/m.   General: Well developed, well nourished. No acute distress. HEENT: External ears normal. Both EACs are impacted with wax. Lungs: Clear to auscultation bilaterally. No wheezing, rales or rhonchi. Psych: Alert and oriented. Normal mood and affect.  Health Maintenance Due  Topic Date Due   Pneumonia Vaccine 55+ Years old (1 of 2 - PCV) Never done   Hepatitis C Screening  Never done     PROCEDURE- Ear Wax Removal Indication: Impacted ear wax  PARQ reviewed with patient. Verbal consent obtained. Lighted curette and alligator forceps used to remove wax. Patient tolerated procedure well.  Assessment & Plan:   Problem List Items Addressed This Visit       Cardiovascular and Mediastinum   Essential hypertension - Primary    Blood pressure is in good control. Continue metoprolol  succinate 25 mg daily.        Respiratory   Asthmatic bronchitis , chronic (HCC)   I recommend she use her Advair  inhaler daily to help prevent flares of her asthma. I will renew her benzonatate  for PRN use.      Relevant Medications   benzonatate  (TESSALON ) 200 MG capsule     Endocrine   Hypothyroidism   TSH was low at last check. I missed the opportunity to reassess this today. Continue 88 mcg daily. We will recheck her thyroid  at her next visit.      Other Visit Diagnoses  Bilateral impacted cerumen       Removed with good results as noted above.       Return in about 3 months (around 07/06/2023) for Reassessment.   Garnette CHRISTELLA Simpler, MD

## 2023-05-19 ENCOUNTER — Ambulatory Visit
Admission: RE | Admit: 2023-05-19 | Discharge: 2023-05-19 | Disposition: A | Discharge status: Home / Self Care | Payer: PPO | Source: Ambulatory Visit | Attending: Family Medicine

## 2023-05-19 DIAGNOSIS — Z1231 Encounter for screening mammogram for malignant neoplasm of breast: Secondary | ICD-10-CM | POA: Diagnosis not present

## 2023-06-11 ENCOUNTER — Encounter: Payer: Self-pay | Admitting: Family Medicine

## 2023-06-11 NOTE — Telephone Encounter (Signed)
 Appt scheduled for 06/13/23 @ 10:40 am.  Dm/cma

## 2023-06-11 NOTE — Telephone Encounter (Signed)
 Patient reports having nausea x 1 week.  Has taken pepto and promethazine with little help.   Temp 96.7.     She did drink a milkshake yesterday and then got real nausea again.    She will stay away from milk and take some Pepto again.    She will let us know if it doesn't get better.  Dm/cma

## 2023-06-13 ENCOUNTER — Ambulatory Visit (INDEPENDENT_AMBULATORY_CARE_PROVIDER_SITE_OTHER): Admitting: Family Medicine

## 2023-06-13 VITALS — BP 122/84 | HR 80 | Temp 96.7°F | Ht 62.0 in | Wt 146.4 lb

## 2023-06-13 DIAGNOSIS — I10 Essential (primary) hypertension: Secondary | ICD-10-CM | POA: Diagnosis not present

## 2023-06-13 DIAGNOSIS — R110 Nausea: Secondary | ICD-10-CM | POA: Diagnosis not present

## 2023-06-13 NOTE — Progress Notes (Signed)
 [facility] [address] [place] [address] Dept: [phone] Dept Fax: [phone]  Office Visit  Subjective:    Patient ID: Kimberly [name], female    DOB: 1950/06/13, 73 y.o..   MRN: [id number]  Chief Complaint  Patient presents with   Nausea    Was nausea x 3-4 days.   Since making the appointment is feeling better.    History of Present Illness:  Patient is in today for having had a 3-4 day time period of nausea. She notes the first episode happened after drinking a banana milkshake. She eventually decided the nausea was related to drinking milk. Since stopping this, she is now no longer having the nausea.  Ms. Toutant has a history of essential hypertension. She is managed on metoprolol succinate 25 mg daily.   Past Medical History: Patient Active Problem List   Diagnosis Date Noted   Acute cough 12/04/2022   Post-COVID chronic cough 08/13/2022   COVID-19 07/20/2022   SUI (stress urinary incontinence, female) 03/23/2021   Hiatal hernia 09/22/2020   GERD (gastroesophageal reflux disease) 09/22/2020   Diverticulosis 09/22/2020   Osteopenia 09/08/2020   Restless leg syndrome 06/05/2020   Hypothyroidism 03/29/2020   Essential hypertension 03/29/2020   Kidney stones 03/29/2020   Asthmatic bronchitis , chronic (HCC) 03/29/2020   Anxiety disorder 03/29/2020   Past Surgical History:  Procedure Laterality Date   ANTERIOR AND POSTERIOR VAGINAL REPAIR W/ SACROSPINOUS LIGAMENT SUSPENSION  06/29/2021   CHOLECYSTECTOMY Bilateral 11/25/2022   Procedure: LAPAROSCOPIC CHOLECYSTECTOMY;  Surgeon: Diamantina Monks, MD;  Location: [facility] OR;  Service: General;  Laterality: Bilateral;   PARTIAL HYSTERECTOMY  1980   Pre-cancerous condition. Failed laser surgery. still have ovaries   UMBILICAL HERNIA REPAIR  2004   with mesh   WRIST SURGERY Left 2019   Family History  Problem Relation Age of Onset   Diabetes Mother    Breast  cancer Mother    Colon cancer Father    Kidney failure Father    COPD Brother    Diabetes Brother    Heart disease Brother    Heart disease Maternal Uncle    Heart disease Daughter    Depression Son    Esophageal cancer Neg Hx    Stomach cancer Neg Hx    Rectal cancer Neg Hx    Outpatient Medications Prior to Visit  Medication Sig Dispense Refill   acetaminophen (TYLENOL) 500 MG tablet Take 2 tablets (1,000 mg total) by mouth 4 (four) times daily. 120 tablet 3   ADVAIR HFA 115-21 MCG/ACT inhaler Inhale 2 puffs into the lungs 2 (two) times daily. 1 each 5   albuterol (VENTOLIN HFA) 108 (90 Base) MCG/ACT inhaler INHALE 2 PUFFS BY MOUTH EVERY 4 HOURS AS NEEDED 8.5 g 3   Albuterol-Budesonide (AIRSUPRA) 90-80 MCG/ACT AERO Inhale 1 puff into the lungs in the morning and at bedtime.     aspirin 81 MG chewable tablet Chew 81 mg by mouth daily.     benzonatate (TESSALON) 200 MG capsule Take 1 capsule (200 mg total) by mouth 2 (two) times daily as needed for cough. 20 capsule 2   buPROPion (WELLBUTRIN SR) 150 MG 12 hr tablet TAKE 1 TABLET(150 MG) BY MOUTH TWICE DAILY 180 tablet 3   chlorpheniramine-HYDROcodone (TUSSIONEX) 10-8 MG/5ML Take 5 mLs by mouth every 12 (twelve) hours as needed for cough. 115 mL 0   cholecalciferol (VITAMIN D3) 25 MCG (1000 UNIT) tablet Take 1,000 Units by mouth daily.  Cranberry 1000 MG CAPS Take 2 capsules by mouth daily.     diazepam (VALIUM) 5 MG tablet TAKE 1/2 TO 1 TABLET BY MOUTH IN THE MORNING AND AT BEDTIME. MAY TAKE AN ADDITIONAL DOSE DAILY AS NEEDED 60 tablet 2   famotidine (PEPCID) 20 MG tablet Take 20 mg by mouth 2 (two) times daily.     ibuprofen (ADVIL) 600 MG tablet Take 600 mg by mouth every 6 (six) hours as needed.     levothyroxine (SYNTHROID) 88 MCG tablet Take 1 tablet (88 mcg total) by mouth daily at 6 (six) AM. 90 tablet 3   metoprolol succinate (TOPROL-XL) 25 MG 24 hr tablet TAKE 1 TABLET(25 MG) BY MOUTH DAILY 90 tablet 3   rOPINIRole (REQUIP)  0.5 MG tablet TAKE 1 TABLET(0.5 MG) BY MOUTH AT BEDTIME 30 tablet 5   phentermine (ADIPEX-P) 37.5 MG tablet Take 37.5 mg by mouth daily.     triamcinolone cream (KENALOG) 0.1 % Apply 1 Application topically 2 (two) times daily. 30 g 0   No facility-administered medications prior to visit.   Allergies  Allergen Reactions   Codeine Hives   Sulfa Antibiotics Hives and Itching     Objective:   Today's Vitals   06/13/23 1034  BP: 122/84  Pulse: 80  Temp: (!) 96.7 F (35.9 C)  TempSrc: Temporal  SpO2: 98%  Weight: 146 lb 6.4 oz (66.4 kg)  Height: 5\' 2"  (1.575 m)   Body mass index is 26.78 kg/m.   General: Well developed, well nourished. No acute distress. Psych: Alert and oriented. Normal mood and affect.  Health Maintenance Due  Topic Date Due   Hepatitis C Screening  Never done   Pneumonia Vaccine 57+ Years old (1 of 2 - PCV) Never done   COVID-19 Vaccine (4 - 2024-25 season) 11/03/2022     Assessment & Plan:   Problem List Items Addressed This Visit       Cardiovascular and Mediastinum   Essential hypertension   Blood pressure is in good control. Continue metoprolol succinate 25 mg daily.      Other Visit Diagnoses       Nausea    -  Primary   May have been related to milk use. Recommend avoidance for now. Consider reintroduction in the future.       Return in about 3 months (around 09/12/2023) for Reassessment.   Loyola Mast, MD

## 2023-06-13 NOTE — Assessment & Plan Note (Signed)
 Blood pressure is in good control. Continue metoprolol succinate 25 mg daily.

## 2023-06-18 ENCOUNTER — Other Ambulatory Visit: Payer: Self-pay | Admitting: Family Medicine

## 2023-06-18 DIAGNOSIS — F411 Generalized anxiety disorder: Secondary | ICD-10-CM

## 2023-06-27 ENCOUNTER — Encounter (HOSPITAL_BASED_OUTPATIENT_CLINIC_OR_DEPARTMENT_OTHER): Payer: Self-pay | Admitting: Pulmonary Disease

## 2023-06-27 ENCOUNTER — Other Ambulatory Visit (HOSPITAL_BASED_OUTPATIENT_CLINIC_OR_DEPARTMENT_OTHER): Payer: Self-pay

## 2023-06-27 ENCOUNTER — Ambulatory Visit (HOSPITAL_BASED_OUTPATIENT_CLINIC_OR_DEPARTMENT_OTHER): Admitting: Pulmonary Disease

## 2023-06-27 VITALS — BP 148/81 | HR 64 | Ht 62.0 in | Wt 146.8 lb

## 2023-06-27 DIAGNOSIS — J45909 Unspecified asthma, uncomplicated: Secondary | ICD-10-CM

## 2023-06-27 MED ORDER — ADVAIR HFA 115-21 MCG/ACT IN AERO: 2.0000 | INHALATION_SPRAY | Freq: Two times a day (BID) | RESPIRATORY_TRACT | 5 refills | Status: DC

## 2023-06-27 MED ORDER — AIRSUPRA 90-80 MCG/ACT IN AERO: 2.0000 | INHALATION_SPRAY | Freq: Four times a day (QID) | RESPIRATORY_TRACT | Status: DC

## 2023-06-27 MED ORDER — AIRSUPRA 90-80 MCG/ACT IN AERO
1.0000 | INHALATION_SPRAY | Freq: Four times a day (QID) | RESPIRATORY_TRACT | 2 refills | Status: DC | PRN
  Filled 2023-06-27: qty 10.7, 30d supply, fill #0
  Filled 2023-09-08 – 2023-09-17 (×2): qty 10.7, 30d supply, fill #1

## 2023-06-27 NOTE — Addendum Note (Signed)
 Addended by: Kary Pages on: 06/27/2023 12:08 PM   Modules accepted: Orders

## 2023-06-27 NOTE — Patient Instructions (Signed)
 Asthmatic bronchitis Post- covid bronchitis - improved --CONTINUE Brand name Advair  HFA . Reduce to ONE puffs in the morning and evening. Rinse mouth out after use. --Provide AirSupra  sample. CONTINUE 1-2 puffs in the morning and evening. Take this until you run out

## 2023-06-27 NOTE — Progress Notes (Signed)
 Subjective:   PATIENT ID: Kimberly [name] GENDER: female DOB: [birth year], MRN: [id number]  Chief Complaint  Patient presents with   Follow-up    Asthmatic bronichitis    Reason for Visit: Follow-up  [name] is 73 year old female former smoker with asthmatic bronchitis, HTN, hypothyroidism, barrett esophagus, GERD, hx uterine cancer s/p partial hysterectomy, RLS who presents for follow-up  Initial consult Recent hospitalization for COVID from 07/19/22-07/22/22. Treated with decadron . Her cough has improved but still having two episodes lasting 15 min a day associated with thick sputum. Some wheezing associated. Has shortness of breath with long distances. Worse with heat. Improves with air conditioning and tussionex. Has had chest pain post cough that is significant  She reports longstanding history of asthmatic bronchitis/asthma since childhood. Hx of hives.Reports annual URI. Never had a hospitalization related to lung issues until this year. [place].  02/04/23 Since our last visit she had resolved cough. Had relief from Airsupra  but could not afford that or Wixela. Also felt like Wixela caused her to break our on her face/mouth. She reports wheezing and worsening shortness of breath that began a few weeks ago. Denies fevers/chills. No productive cough.  06/27/23 Since our last visit she has been Advair  two puffs twice a day. Rarely uses AirSupra . She does have shortness of breath but no coughing or wheezing. Pollen will trigger her so prefers to stay indoors. Has some hoarseness with inhaler. She is sedentary at baseline.   Social History: Former smoker. Quit in 1995/1996 Retired Audiological scientist estate business Drinks 4 glasses a month  Past Medical History:  Diagnosis Date   Anxiety    Asthma    Barrett esophagus    Chronic headaches    Colon polyps    COVID 03/2020   Depression    GERD (gastroesophageal reflux disease)    Hypertension     Kidney stones    Pneumonia    Thyroid  disease    Uterine cancer (HCC) 1980     Family History  Problem Relation Age of Onset   Diabetes Mother    Breast cancer Mother    Colon cancer Father    Kidney failure Father    COPD Brother    Diabetes Brother    Heart disease Brother    Heart disease Maternal Uncle    Heart disease Daughter    Depression Son    Esophageal cancer Neg Hx    Stomach cancer Neg Hx    Rectal cancer Neg Hx      Social History   Occupational History   Occupation: retired    Comment: Retired  Tobacco Use   Smoking status: Former    Current packs/day: 0.00    Types: Cigarettes    Quit date: 1995    Years since quitting: 30.3   Smokeless tobacco: Never  Vaping Use   Vaping status: Never Used  Substance and Sexual Activity   Alcohol  use: Not Currently    Alcohol /week: 1.0 standard drink of alcohol     Types: 1 Cans of beer per week    Comment: 1 week   Drug use: Never   Sexual activity: Not Currently    Birth control/protection: Surgical    Comment: Hyst    Allergies  Allergen Reactions   Codeine Hives   Sulfa Antibiotics Hives and Itching     Outpatient Medications Prior to Visit  Medication Sig Dispense Refill   acetaminophen  (TYLENOL ) 500 MG tablet Take 2  tablets (1,000 mg total) by mouth 4 (four) times daily. 120 tablet 3   albuterol  (VENTOLIN  HFA) 108 (90 Base) MCG/ACT inhaler INHALE 2 PUFFS BY MOUTH EVERY 4 HOURS AS NEEDED 8.5 g 3   aspirin  81 MG chewable tablet Chew 81 mg by mouth daily.     benzonatate  (TESSALON ) 200 MG capsule Take 1 capsule (200 mg total) by mouth 2 (two) times daily as needed for cough. 20 capsule 2   buPROPion  (WELLBUTRIN  SR) 150 MG 12 hr tablet TAKE 1 TABLET(150 MG) BY MOUTH TWICE DAILY 180 tablet 3   chlorpheniramine-HYDROcodone  (TUSSIONEX) 10-8 MG/5ML Take 5 mLs by mouth every 12 (twelve) hours as needed for cough. 115 mL 0   cholecalciferol (VITAMIN D3) 25 MCG (1000 UNIT) tablet Take 1,000 Units by mouth  daily.     Cranberry 1000 MG CAPS Take 2 capsules by mouth daily.     diazepam  (VALIUM ) 5 MG tablet TAKE 1/2 TO 1 TABLET BY MOUTH IN THE MORNING AND AT BEDTIME. MAY TAKE AN ADDITIONAL DOSE DAILY AS NEEDED 60 tablet 2   famotidine (PEPCID) 20 MG tablet Take 20 mg by mouth 2 (two) times daily.     ibuprofen  (ADVIL ) 600 MG tablet Take 600 mg by mouth every 6 (six) hours as needed.     levothyroxine  (SYNTHROID ) 88 MCG tablet Take 1 tablet (88 mcg total) by mouth daily at 6 (six) AM. 90 tablet 3   metoprolol  succinate (TOPROL -XL) 25 MG 24 hr tablet TAKE 1 TABLET(25 MG) BY MOUTH DAILY 90 tablet 3   rOPINIRole  (REQUIP ) 0.5 MG tablet TAKE 1 TABLET(0.5 MG) BY MOUTH AT BEDTIME 30 tablet 5   ADVAIR  HFA 115-21 MCG/ACT inhaler Inhale 2 puffs into the lungs 2 (two) times daily. 1 each 5   Albuterol -Budesonide  (AIRSUPRA ) 90-80 MCG/ACT AERO Inhale 1 puff into the lungs in the morning and at bedtime. (Patient not taking: Reported on 06/27/2023)     No facility-administered medications prior to visit.    Review of Systems  Constitutional:  Negative for chills, diaphoresis, fever, malaise/fatigue and weight loss.  HENT:  Negative for congestion.   Respiratory:  Positive for shortness of breath. Negative for cough, hemoptysis, sputum production and wheezing.   Cardiovascular:  Negative for chest pain, palpitations and leg swelling.     Objective:   Vitals:   06/27/23 0909  BP: (!) 148/81  Pulse: 64  SpO2: 100%  Weight: 146 lb 12.8 oz (66.6 kg)  Height: 5\' 2"  (1.575 m)   SpO2: 100 %  Physical Exam: General: Well-appearing, no acute distress HENT: [place], AT Eyes: EOMI, no scleral icterus Respiratory: Clear to auscultation bilaterally.  No crackles, wheezing or rales Cardiovascular: RRR, -M/R/G, no JVD Extremities:-Edema,-tenderness Neuro: AAO x4, CNII-XII grossly intact Psych: Normal mood, normal affect  Data Reviewed:  Imaging: CXR 08/13/22 - No infiltrate of effusion or edema  PFT: None on  file  Labs: CBC    Component Value Date/Time   WBC 6.4 11/21/2022 1000   RBC 4.59 11/21/2022 1000   HGB 12.6 11/21/2022 1000   HCT 40.2 11/21/2022 1000   PLT 179 11/21/2022 1000   MCV 87.6 11/21/2022 1000   MCH 27.5 11/21/2022 1000   MCHC 31.3 11/21/2022 1000   RDW 12.4 11/21/2022 1000   LYMPHSABS 2.3 08/13/2022 1331   MONOABS 0.5 08/13/2022 1331   EOSABS 0.3 08/13/2022 1331   BASOSABS 0.0 08/13/2022 1331        Assessment & Plan:   Discussion: 73 year old female  former smoker with asthmatic bronchitis, HTN, hypothyroidism, barrett esophagus, GERD, hx uterine cancer s/p partial hysterectomy, RLS who presents for follow-up for asthmatic bronchitis. Previously seen for asthmatic bronchitis likely exacerbated by recent covid infection. Symptoms improved on Airsupra    Unable to tolerate Wixela. Now cannot tolerate powder inhalers due to rash.  Asthmatic bronchitis Post- covid bronchitis - improved --CONTINUE Brand name Advair  HFA . Reduce to ONE puffs in the morning and evening. Rinse mouth out after use. --Provide AirSupra  sample. CONTINUE 1-2 puffs in the morning and evening. Take this until you run out  Health Maintenance Immunization History  Administered Date(s) Administered   Moderna Covid-19 Vaccine Bivalent Booster 32yrs & up 12/26/2020   PFIZER Comirnaty(Gray Top)Covid-19 Tri-Sucrose Vaccine 05/12/2019, 06/13/2019   Td 03/04/2016   CT Lung Screen - not qualified. Quit >15 years ago  No orders of the defined types were placed in this encounter.  Meds ordered this encounter  Medications   ADVAIR  HFA 115-21 MCG/ACT inhaler    Sig: Inhale 2 puffs into the lungs 2 (two) times daily.    Dispense:  1 each    Refill:  5    Brand name only    Return in about 6 months (around 12/27/2023).  I have spent a total time of 30-minutes on the day of the appointment including chart review, data review, collecting history, coordinating care and discussing medical diagnosis  and plan with the patient/family. Past medical history, allergies, medications were reviewed. Pertinent imaging, labs and tests included in this note have been reviewed and interpreted independently by me.  Auguste Tebbetts Genetta Kenning, MD [facility] 06/27/2023 9:26 AM

## 2023-07-17 ENCOUNTER — Encounter: Payer: Self-pay | Admitting: Family Medicine

## 2023-07-18 ENCOUNTER — Encounter: Payer: Self-pay | Admitting: Family

## 2023-07-18 ENCOUNTER — Ambulatory Visit (INDEPENDENT_AMBULATORY_CARE_PROVIDER_SITE_OTHER): Admitting: Family

## 2023-07-18 ENCOUNTER — Ambulatory Visit: Payer: Self-pay | Admitting: *Deleted

## 2023-07-18 VITALS — BP 148/88 | HR 75 | Temp 98.3°F | Resp 18 | Ht 62.0 in | Wt 145.6 lb

## 2023-07-18 DIAGNOSIS — R051 Acute cough: Secondary | ICD-10-CM | POA: Diagnosis not present

## 2023-07-18 DIAGNOSIS — J029 Acute pharyngitis, unspecified: Secondary | ICD-10-CM | POA: Diagnosis not present

## 2023-07-18 MED ORDER — AZITHROMYCIN 250 MG PO TABS: ORAL_TABLET | ORAL | 0 refills | Status: DC

## 2023-07-18 MED ORDER — HYDROCOD POLI-CHLORPHE POLI ER 10-8 MG/5ML PO SUER: 5.0000 mL | Freq: Two times a day (BID) | ORAL | 0 refills | Status: DC | PRN

## 2023-07-18 NOTE — Patient Instructions (Signed)
 It was nice to meet you today. I hope you feel better soon. Please let Dr. Therese Flash know if your symptoms persist though.

## 2023-07-18 NOTE — Progress Notes (Signed)
 Kimberly [name] is a 73 y.o. female with the following history as recorded in Epic[facility]:  Patient Active Problem List   Diagnosis Date Noted   Acute cough 12/04/2022   Post-COVID chronic cough 08/13/2022   COVID-19 07/20/2022   SUI (stress urinary incontinence, female) 03/23/2021   Hiatal hernia 09/22/2020   GERD (gastroesophageal reflux disease) 09/22/2020   Diverticulosis 09/22/2020   Osteopenia 09/08/2020   Restless leg syndrome 06/05/2020   Hypothyroidism 03/29/2020   Essential hypertension 03/29/2020   Kidney stones 03/29/2020   Asthmatic bronchitis , chronic (HCC) 03/29/2020   Anxiety disorder 03/29/2020    Current Outpatient Medications  Medication Sig Dispense Refill   acetaminophen  (TYLENOL ) 500 MG tablet Take 2 tablets (1,000 mg total) by mouth 4 (four) times daily. 120 tablet 3   albuterol  (VENTOLIN  HFA) 108 (90 Base) MCG/ACT inhaler INHALE 2 PUFFS BY MOUTH EVERY 4 HOURS AS NEEDED 8.5 g 3   Albuterol -Budesonide  (AIRSUPRA ) 90-80 MCG/ACT AERO Inhale 1 puff into the lungs in the morning and at bedtime.     Albuterol -Budesonide  (AIRSUPRA ) 90-80 MCG/ACT AERO Inhale 1-2 puffs into the lungs every 6 (six) hours as needed. 10.7 g 2   Albuterol -Budesonide  (AIRSUPRA ) 90-80 MCG/ACT AERO Inhale 2 puffs into the lungs 4 (four) times daily.     aspirin  81 MG chewable tablet Chew 81 mg by mouth daily.     azithromycin  (ZITHROMAX  Z-PAK) 250 MG tablet Take 2 tablets (500 mg) PO today, then 1 tablet (250 mg) PO daily x4 days. 6 tablet 0   benzonatate  (TESSALON ) 200 MG capsule Take 1 capsule (200 mg total) by mouth 2 (two) times daily as needed for cough. 20 capsule 2   buPROPion  (WELLBUTRIN  SR) 150 MG 12 hr tablet TAKE 1 TABLET(150 MG) BY MOUTH TWICE DAILY 180 tablet 3   cholecalciferol (VITAMIN D3) 25 MCG (1000 UNIT) tablet Take 1,000 Units by mouth daily.     Cranberry 1000 MG CAPS Take 2 capsules by mouth daily.     diazepam  (VALIUM ) 5 MG tablet TAKE 1/2 TO 1 TABLET BY MOUTH IN THE MORNING  AND AT BEDTIME. MAY TAKE AN ADDITIONAL DOSE DAILY AS NEEDED 60 tablet 2   famotidine (PEPCID) 20 MG tablet Take 20 mg by mouth 2 (two) times daily.     ibuprofen  (ADVIL ) 600 MG tablet Take 600 mg by mouth every 6 (six) hours as needed.     levothyroxine  (SYNTHROID ) 88 MCG tablet Take 1 tablet (88 mcg total) by mouth daily at 6 (six) AM. 90 tablet 3   metoprolol  succinate (TOPROL -XL) 25 MG 24 hr tablet TAKE 1 TABLET(25 MG) BY MOUTH DAILY 90 tablet 3   rOPINIRole  (REQUIP ) 0.5 MG tablet TAKE 1 TABLET(0.5 MG) BY MOUTH AT BEDTIME 30 tablet 5   ADVAIR  HFA 115-21 MCG/ACT inhaler Inhale 2 puffs into the lungs 2 (two) times daily. 1 each 5   chlorpheniramine-HYDROcodone  (TUSSIONEX) 10-8 MG/5ML Take 5 mLs by mouth every 12 (twelve) hours as needed for cough. 115 mL 0   No current facility-administered medications for this visit.    Allergies: Codeine and Sulfa antibiotics  Past Medical History:  Diagnosis Date   Anxiety    Asthma    Barrett esophagus    Chronic headaches    Colon polyps    COVID 03/2020   Depression    GERD (gastroesophageal reflux disease)    Hypertension    Kidney stones    Pneumonia    Thyroid  disease    Uterine cancer (HCC) 1980  Past Surgical History:  Procedure Laterality Date   ANTERIOR AND POSTERIOR VAGINAL REPAIR W/ SACROSPINOUS LIGAMENT SUSPENSION  06/29/2021   CHOLECYSTECTOMY Bilateral 11/25/2022   Procedure: LAPAROSCOPIC CHOLECYSTECTOMY;  Surgeon: Anda Bamberg, MD;  Location: [facility] OR;  Service: General;  Laterality: Bilateral;   PARTIAL HYSTERECTOMY  1980   Pre-cancerous condition. Failed laser surgery. still have ovaries   UMBILICAL HERNIA REPAIR  2004   with mesh   WRIST SURGERY Left 2019    Family History  Problem Relation Age of Onset   Diabetes Mother    Breast cancer Mother    Colon cancer Father    Kidney failure Father    COPD Brother    Diabetes Brother    Heart disease Brother    Heart disease Maternal Uncle    Heart disease Daughter     Depression Son    Esophageal cancer Neg Hx    Stomach cancer Neg Hx    Rectal cancer Neg Hx     Social History   Tobacco Use   Smoking status: Former    Current packs/day: 0.00    Types: Cigarettes    Quit date: 1995    Years since quitting: 30.4   Smokeless tobacco: Never  Substance Use Topics   Alcohol  use: Not Currently    Alcohol /week: 1.0 standard drink of alcohol     Types: 1 Cans of beer per week    Comment: 1 week    Subjective:   Sore throat x 2 days; symptoms started suddenly yesterday; patient feels she is having a fever; no OTC medications;    Objective:  Vitals:   07/18/23 1459  BP: (!) 148/88  Pulse: 75  Resp: 18  Temp: 98.3 F (36.8 C)  TempSrc: Oral  SpO2: 97%  Weight: 145 lb 9.6 oz (66 kg)  Height: 5\' 2"  (1.575 m)    General: Well developed, well nourished, in no acute distress  Skin : Warm and dry.  Head: Normocephalic and atraumatic  Eyes: Sclera and conjunctiva clear; pupils round and reactive to light; extraocular movements intact  Ears: External normal; canals clear; tympanic membranes normal  Oropharynx: Pink, supple. No suspicious lesions  Neck: Supple without thyromegaly, adenopathy  Lungs: Respirations unlabored; clear to auscultation bilaterally without wheeze, rales, rhonchi  Vessels: Symmetric bilaterally  Neurologic: Alert and oriented; speech intact; face symmetrical; moves all extremities well; CNII-XII intact without focal deficit   Assessment:  1. Acute sore throat   2. Acute cough     Plan:   Negative COVID, flu and strep test; Rx for Z-pak and Tussionex which she has tolerated well in the past; encouraged fluids, rest and follow up with her PCP with continued symptoms.   No follow-ups on file.  No orders of the defined types were placed in this encounter.   Requested Prescriptions   Signed Prescriptions Disp Refills   azithromycin  (ZITHROMAX  Z-PAK) 250 MG tablet 6 tablet 0    Sig: Take 2 tablets (500 mg) PO today,  then 1 tablet (250 mg) PO daily x4 days.   chlorpheniramine-HYDROcodone  (TUSSIONEX) 10-8 MG/5ML 115 mL 0    Sig: Take 5 mLs by mouth every 12 (twelve) hours as needed for cough.

## 2023-07-18 NOTE — Telephone Encounter (Signed)
  Chief Complaint: sore throat Symptoms: sore throat- hurt to swallow, nasal congestion Frequency: started yesterday Pertinent Negatives: Patient denies fever Disposition: [] ED /[] Urgent Care (no appt availability in office) / [x] Appointment(In office/virtual)/ []  [facility]/ [] Home Care/ [] Refused Recommended Disposition /[] [facility]/ []  Follow-up with PCP Additional Notes: Patient refuses UC- call to CAL-call transferred for scheduling    Copied from CRM [id number]. Topic: Clinical - Red Word Triage >> Jul 18, 2023  8:52 AM Baldo Levan wrote: Reason for CRM: She is experiencing throat pain and having trouble swallowing.  Patient sent a mychart message regarding current symptoms and was asked to schedule an appointment. Patient called into schedule the appointment but the first available is 05/23, patient is schedule but would like a nurse to know about her current symptoms. Reason for Disposition  SEVERE (e.g., excruciating) throat pain  Answer Assessment - Initial Assessment Questions 1. ONSET: "When did the throat start hurting?" (Hours or days ago)      Yesterday am- throat on fire 2. SEVERITY: "How bad is the sore throat?" (Scale 1-10; mild, moderate or severe)   - MILD (1-3):  Doesn't interfere with eating or normal activities.   - MODERATE (4-7): Interferes with eating some solids and normal activities.   - SEVERE (8-10):  Excruciating pain, interferes with most normal activities.   - SEVERE WITH DYSPHAGIA (10): Can't swallow liquids, drooling.     severe 3. STREP EXPOSURE: "Has there been any exposure to strep within the past week?" If Yes, ask: "What type of contact occurred?"      no 4.  VIRAL SYMPTOMS: "Are there any symptoms of a cold, such as a runny nose, cough, hoarse voice or red eyes?"      congestion 5. FEVER: "Do you have a fever?" If Yes, ask: "What is your temperature, how was it measured, and when did it start?"     unsure 6. PUS ON THE  TONSILS: "Is there pus on the tonsils in the back of your throat?"     Whitish, red 7. OTHER SYMPTOMS: "Do you have any other symptoms?" (e.g., difficulty breathing, headache, rash)     headache  Protocols used: Sore Throat-A-AH

## 2023-07-21 ENCOUNTER — Ambulatory Visit: Admitting: Family Medicine

## 2023-07-22 ENCOUNTER — Encounter: Payer: Self-pay | Admitting: Family Medicine

## 2023-07-22 ENCOUNTER — Ambulatory Visit: Payer: Self-pay | Admitting: Family Medicine

## 2023-07-22 ENCOUNTER — Ambulatory Visit (INDEPENDENT_AMBULATORY_CARE_PROVIDER_SITE_OTHER): Admitting: Family Medicine

## 2023-07-22 VITALS — BP 134/78 | HR 72 | Temp 97.3°F | Ht 62.0 in | Wt 147.2 lb

## 2023-07-22 DIAGNOSIS — J4489 Other specified chronic obstructive pulmonary disease: Secondary | ICD-10-CM

## 2023-07-22 DIAGNOSIS — E039 Hypothyroidism, unspecified: Secondary | ICD-10-CM | POA: Diagnosis not present

## 2023-07-22 DIAGNOSIS — I10 Essential (primary) hypertension: Secondary | ICD-10-CM

## 2023-07-22 DIAGNOSIS — J029 Acute pharyngitis, unspecified: Secondary | ICD-10-CM | POA: Diagnosis not present

## 2023-07-22 LAB — TSH: TSH: 11.07 u[IU]/mL — ABNORMAL HIGH (ref 0.35–5.50)

## 2023-07-22 NOTE — Assessment & Plan Note (Signed)
 Blood pressure is in good control. Continue metoprolol succinate 25 mg daily.

## 2023-07-22 NOTE — Assessment & Plan Note (Signed)
 Continue albuterol -budesonide  (Airsupra ) and PRN albuterol .

## 2023-07-22 NOTE — Assessment & Plan Note (Signed)
 TSH was low at last check. I will reassess this today. Continue 88 mcg daily.

## 2023-07-22 NOTE — Progress Notes (Signed)
 [facility] [address] [place] [address] Dept: [phone] Dept Fax: [phone]  Chronic Care Office Visit  Subjective:    Patient ID: Kimberly [name], female    DOB: 1950-03-21, 73 y.o..   MRN: [id number]  Chief Complaint  Patient presents with   Hypertension    3 month f/u HTN.       History of Present Illness:  Patient is in today for reassessment of chronic medical issues.  Ms. Yassin has a history of essential hypertension. She is managed on metoprolol  succinate 25 mg daily.    Ms. Adderley was seen by Dr. Washington Hacker (pulmonology) in December related to a chronic cough. Although Ms. Lint had felt this was a post-bronchitic cough, she had similar issues after COVID and other episodes of viral illness. Dr. Washington Hacker felt this has been an asthmatic bronchitis due to prior asthma. Ms. Brusseau had improvement with the use of Airsupra  and PRN albuterol .   Ms. Mcbrien has a history of hypothyroidism. She is managed on levothyroxine  88 mcg daily.  Ms. Hedstrom was recently seen by Dr. Ike Malady with an an acute severe sore throat. She was treated with a Z-pack and hydrocodone  cough syrup. Ms. Riddle feels this has resolved with this therapy.  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   SUI (stress urinary incontinence, female) 03/23/2021   Hiatal hernia 09/22/2020   GERD (gastroesophageal reflux disease) 09/22/2020   Diverticulosis 09/22/2020   Osteopenia 09/08/2020   Restless leg syndrome 06/05/2020   Hypothyroidism 03/29/2020   Essential hypertension 03/29/2020   Kidney stones 03/29/2020   Asthmatic bronchitis , chronic (HCC) 03/29/2020   Anxiety disorder 03/29/2020   Past Surgical History:  Procedure Laterality Date   ANTERIOR AND POSTERIOR VAGINAL REPAIR W/ SACROSPINOUS LIGAMENT SUSPENSION  06/29/2021   CHOLECYSTECTOMY Bilateral 11/25/2022    Procedure: LAPAROSCOPIC CHOLECYSTECTOMY;  Surgeon: Anda Bamberg, MD;  Location: [facility] OR;  Service: General;  Laterality: Bilateral;   PARTIAL HYSTERECTOMY  1980   Pre-cancerous condition. Failed laser surgery. still have ovaries   UMBILICAL HERNIA REPAIR  2004   with mesh   WRIST SURGERY Left 2019   Family History  Problem Relation Age of Onset   Diabetes Mother    Breast cancer Mother    Colon cancer Father    Kidney failure Father    COPD Brother    Diabetes Brother    Heart disease Brother    Heart disease Maternal Uncle    Heart disease Daughter    Depression Son    Esophageal cancer Neg Hx    Stomach cancer Neg Hx    Rectal cancer Neg Hx    Outpatient Medications Prior to Visit  Medication Sig Dispense Refill   acetaminophen  (TYLENOL ) 500 MG tablet Take 2 tablets (1,000 mg total) by mouth 4 (four) times daily. 120 tablet 3   albuterol  (VENTOLIN  HFA) 108 (90 Base) MCG/ACT inhaler INHALE 2 PUFFS BY MOUTH EVERY 4 HOURS AS NEEDED 8.5 g 3   Albuterol -Budesonide  (AIRSUPRA ) 90-80 MCG/ACT AERO Inhale 1-2 puffs into the lungs every 6 (six) hours as needed. 10.7 g 2   aspirin  81 MG chewable tablet Chew 81 mg by mouth daily.     benzonatate  (TESSALON ) 200 MG capsule Take 1 capsule (200 mg total) by mouth 2 (two) times daily as needed for cough. 20 capsule 2   buPROPion  (WELLBUTRIN  SR) 150 MG 12 hr tablet TAKE 1  TABLET(150 MG) BY MOUTH TWICE DAILY 180 tablet 3   chlorpheniramine-HYDROcodone  (TUSSIONEX) 10-8 MG/5ML Take 5 mLs by mouth every 12 (twelve) hours as needed for cough. 115 mL 0   cholecalciferol (VITAMIN D3) 25 MCG (1000 UNIT) tablet Take 1,000 Units by mouth daily.     Cranberry 1000 MG CAPS Take 2 capsules by mouth daily.     diazepam  (VALIUM ) 5 MG tablet TAKE 1/2 TO 1 TABLET BY MOUTH IN THE MORNING AND AT BEDTIME. MAY TAKE AN ADDITIONAL DOSE DAILY AS NEEDED 60 tablet 2   famotidine (PEPCID) 20 MG tablet Take 20 mg by mouth 2 (two) times daily.     ibuprofen  (ADVIL ) 600  MG tablet Take 600 mg by mouth every 6 (six) hours as needed.     levothyroxine  (SYNTHROID ) 88 MCG tablet Take 1 tablet (88 mcg total) by mouth daily at 6 (six) AM. 90 tablet 3   metoprolol  succinate (TOPROL -XL) 25 MG 24 hr tablet TAKE 1 TABLET(25 MG) BY MOUTH DAILY 90 tablet 3   rOPINIRole  (REQUIP ) 0.5 MG tablet TAKE 1 TABLET(0.5 MG) BY MOUTH AT BEDTIME 30 tablet 5   ADVAIR  HFA 115-21 MCG/ACT inhaler Inhale 2 puffs into the lungs 2 (two) times daily. 1 each 5   Albuterol -Budesonide  (AIRSUPRA ) 90-80 MCG/ACT AERO Inhale 1 puff into the lungs in the morning and at bedtime.     Albuterol -Budesonide  (AIRSUPRA ) 90-80 MCG/ACT AERO Inhale 2 puffs into the lungs 4 (four) times daily.     azithromycin  (ZITHROMAX  Z-PAK) 250 MG tablet Take 2 tablets (500 mg) PO today, then 1 tablet (250 mg) PO daily x4 days. 6 tablet 0   No facility-administered medications prior to visit.   Allergies  Allergen Reactions   Codeine Hives   Sulfa Antibiotics Hives and Itching   Objective:   Today's Vitals   07/22/23 1006  BP: 134/78  Pulse: 72  Temp: (!) 97.3 F (36.3 C)  TempSrc: Temporal  SpO2: 98%  Weight: 147 lb 3.2 oz (66.8 kg)  Height: 5\' 2"  (1.575 m)   Body mass index is 26.92 kg/m.   General: Well developed, well nourished. No acute distress. HEENT: Normocephalic, non-traumatic. External ears normal. EAC and TMs normal bilaterally. PERRL, EOMI.   Conjunctiva clear. Nose clear without congestion or rhinorrhea. Mucous membranes moist. Oropharynx clear. Good   dentition. Neck: Supple. No lymphadenopathy. No thyromegaly. Lungs: Clear to auscultation bilaterally. No wheezing, rales or rhonchi. Psych: Alert and oriented. Normal mood and affect.  Health Maintenance Due  Topic Date Due   Hepatitis C Screening  Never done   Pneumonia Vaccine 1+ Years old (1 of 2 - PCV) Never done   Zoster Vaccines- Shingrix (1 of 2) Never done     Assessment & Plan:   Problem List Items Addressed This Visit        Cardiovascular and Mediastinum   Essential hypertension - Primary   Blood pressure is in good control. Continue metoprolol  succinate 25 mg daily.        Respiratory   Asthmatic bronchitis , chronic (HCC)   Continue albuterol -budesonide  (Airsupra ) and PRN albuterol .        Endocrine   Hypothyroidism   TSH was low at last check. I will reassess this today. Continue 88 mcg daily.      Relevant Orders   TSH   Other Visit Diagnoses       Pharyngitis, unspecified etiology       Resolved.       Return in about  3 months (around 10/22/2023) for Reassessment.   Graig Lawyer, MD

## 2023-07-23 MED ORDER — LEVOTHYROXINE SODIUM 100 MCG PO TABS
100.0000 ug | ORAL_TABLET | Freq: Every day | ORAL | 3 refills | Status: DC
Start: 2023-07-23 — End: 2023-10-10

## 2023-07-23 NOTE — Telephone Encounter (Signed)
Pt returned your call; please try her again.

## 2023-07-23 NOTE — Addendum Note (Signed)
 Addended by: Graig Lawyer on: 07/23/2023 04:15 PM   Modules accepted: Orders

## 2023-07-25 ENCOUNTER — Ambulatory Visit: Admitting: Family Medicine

## 2023-08-20 IMAGING — MG MM DIGITAL SCREENING BILAT W/ TOMO AND CAD
8 series · 9 of 24 positions shown · non-contrast
Comparison: Previous exam(s).

CLINICAL DATA: Screening.

EXAM:
DIGITAL SCREENING BILATERAL MAMMOGRAM WITH TOMOSYNTHESIS AND CAD
TECHNIQUE: Bilateral screening digital craniocaudal and mediolateral oblique
mammograms were obtained. Bilateral screening digital breast
tomosynthesis was performed. The images were evaluated with
computer-aided detection.

[R MLO synth-2D]
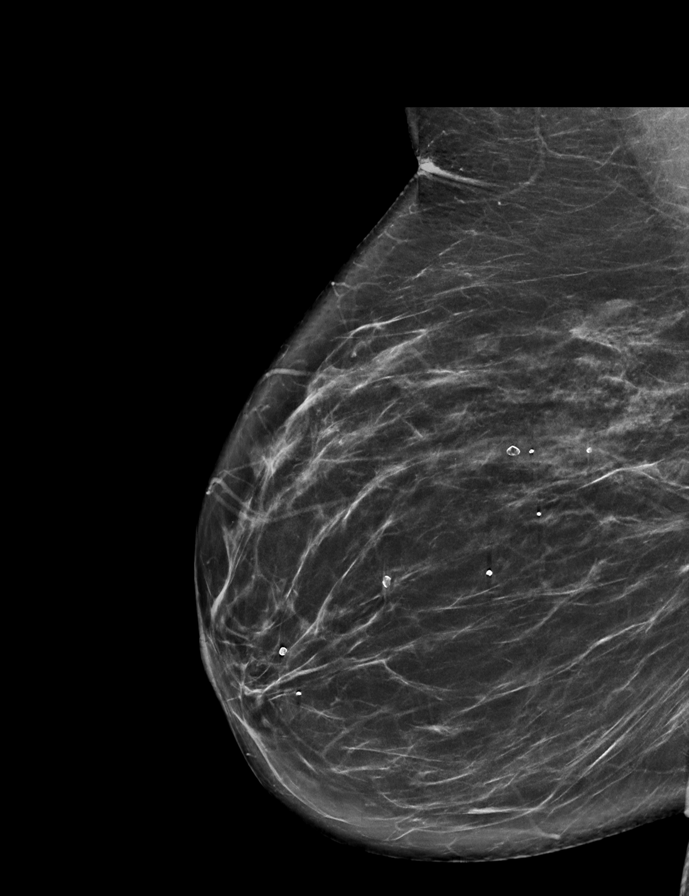

[L MLO synth-2D]
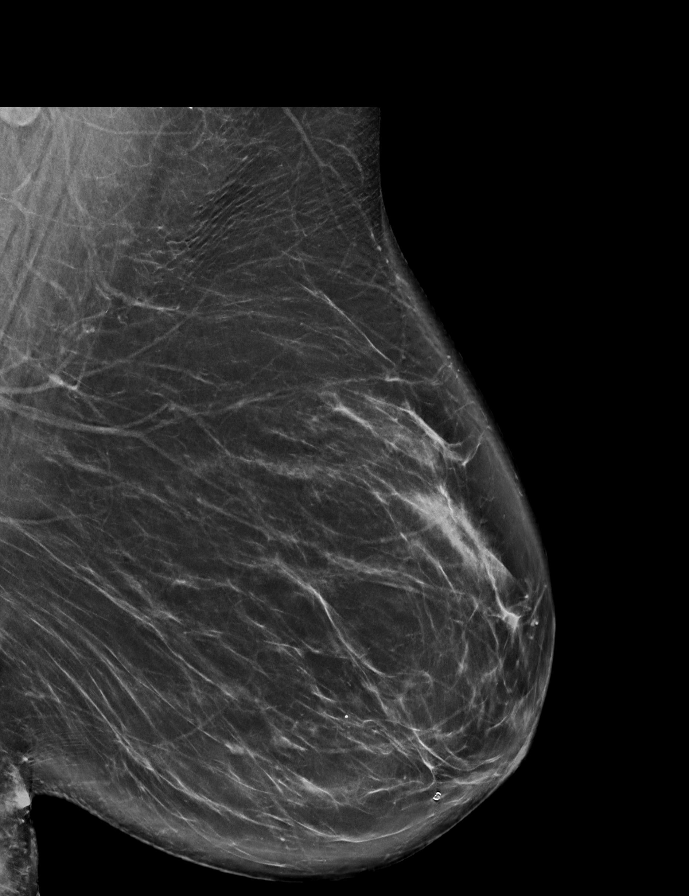

[L CC synth-2D]
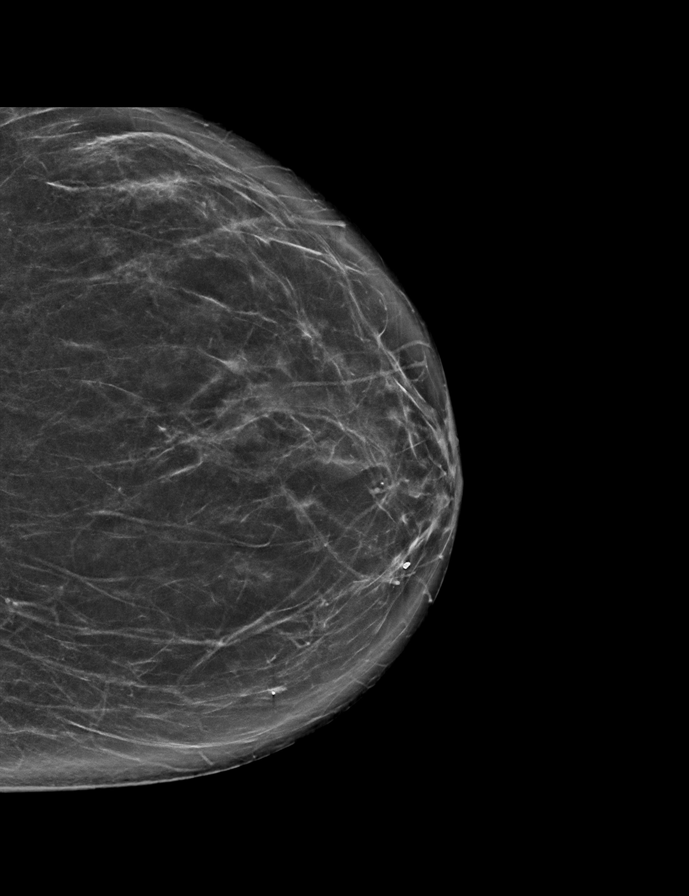

[R CC synth-2D]
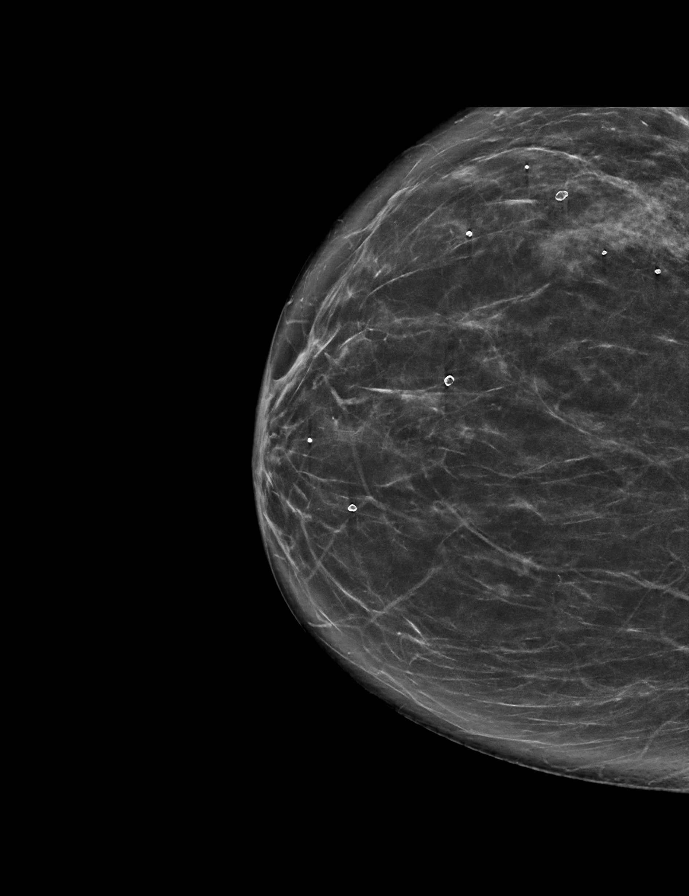

[L CC tomo · 2 of 72 frames shown]
[frame 24/72]
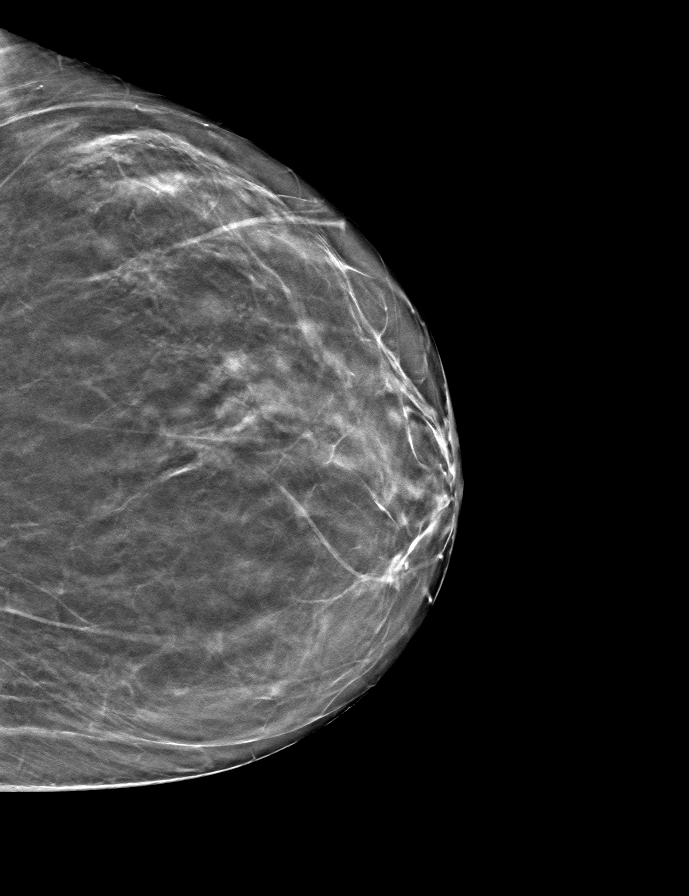
[frame 37/72]
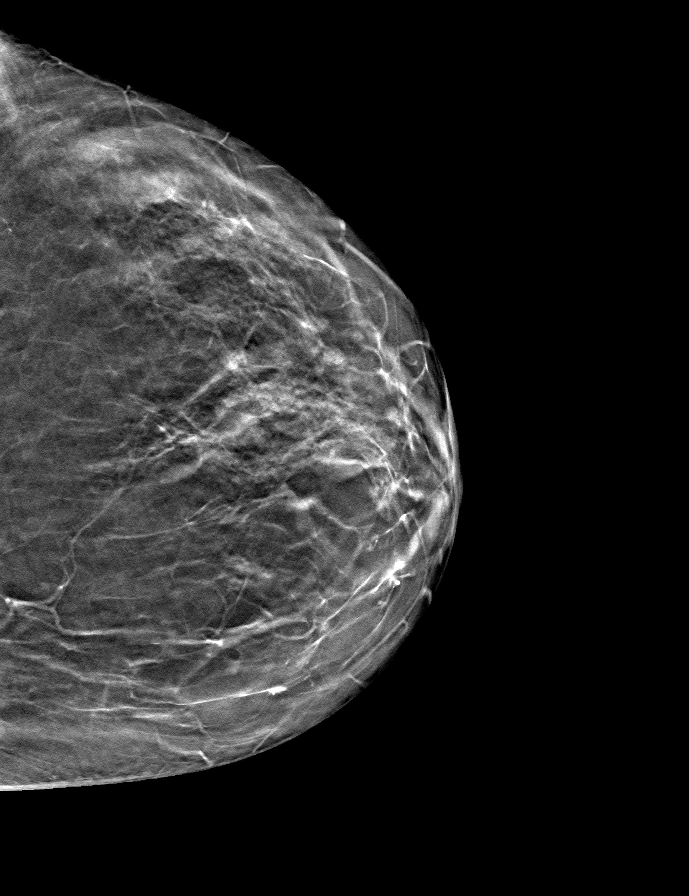

[R CC tomo · tomo slice 35/70.0]
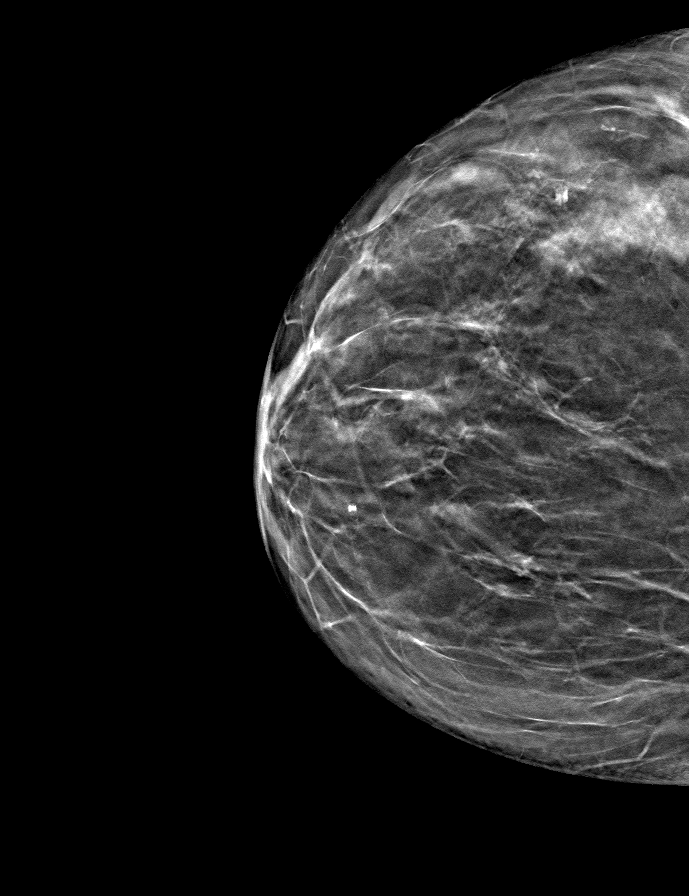

[L MLO tomo · tomo slice 41/80.0]
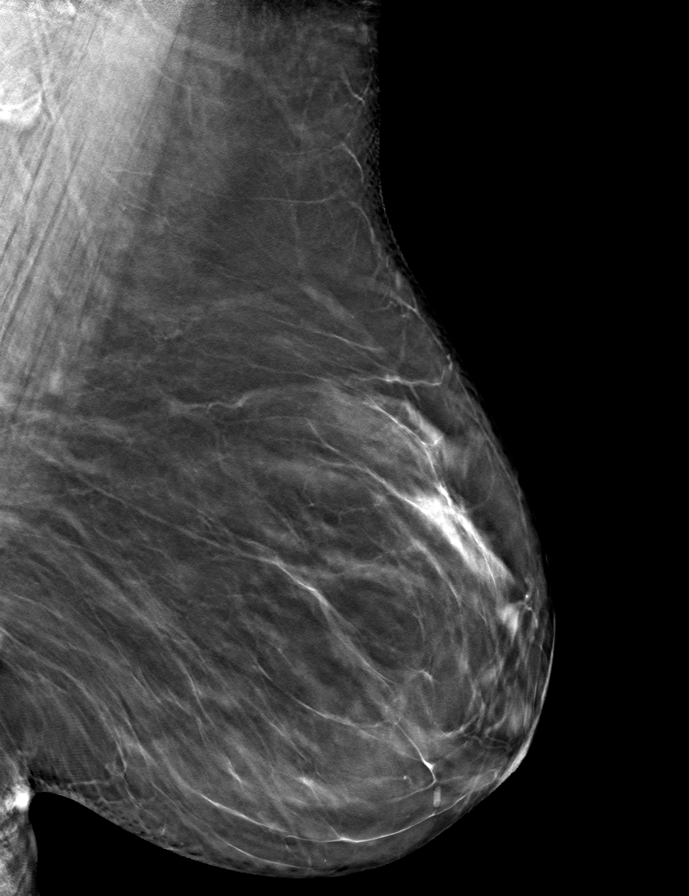

[R MLO tomo · tomo slice 37/73.0]
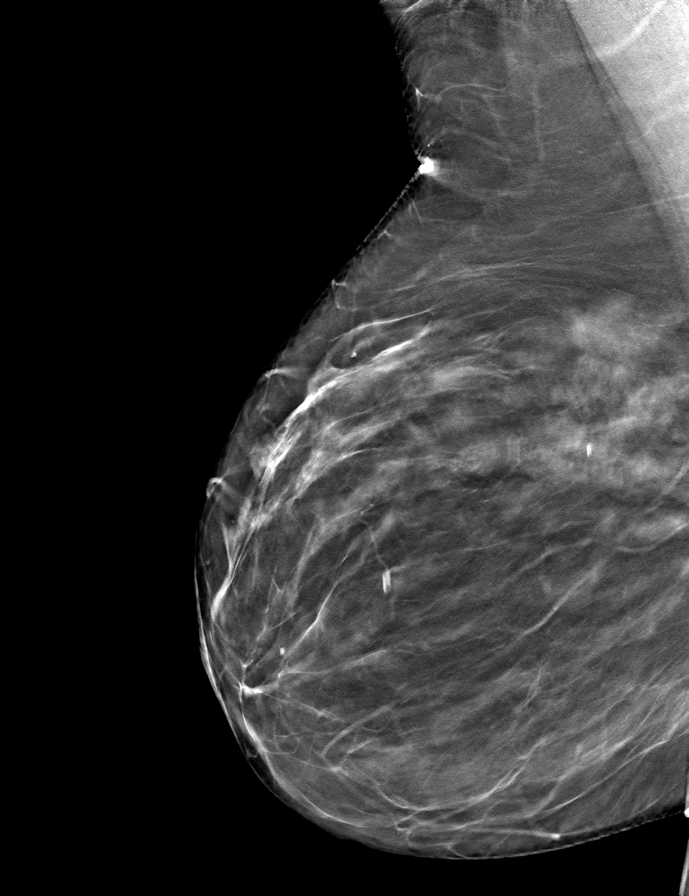

[9 of 24 positions shown; findings below may reference images not displayed]

ACR Breast Density Category b: There are scattered areas of
fibroglandular density.
FINDINGS: There are no findings suspicious for malignancy.
IMPRESSION: No mammographic evidence of malignancy. A result letter of this
screening mammogram will be mailed directly to the patient.

RECOMMENDATION:
Screening mammogram in one year. (Code:51-O-LD2)

BI-RADS CATEGORY  1: Negative.

## 2023-08-31 ENCOUNTER — Other Ambulatory Visit: Payer: Self-pay | Admitting: Family Medicine

## 2023-08-31 DIAGNOSIS — G2581 Restless legs syndrome: Secondary | ICD-10-CM

## 2023-09-08 ENCOUNTER — Other Ambulatory Visit (HOSPITAL_COMMUNITY): Payer: Self-pay

## 2023-09-08 ENCOUNTER — Other Ambulatory Visit (HOSPITAL_BASED_OUTPATIENT_CLINIC_OR_DEPARTMENT_OTHER): Payer: Self-pay

## 2023-09-08 ENCOUNTER — Other Ambulatory Visit: Payer: Self-pay

## 2023-09-08 ENCOUNTER — Encounter: Payer: Self-pay | Admitting: Pharmacist

## 2023-09-11 ENCOUNTER — Other Ambulatory Visit: Payer: Self-pay

## 2023-09-12 ENCOUNTER — Ambulatory Visit: Admitting: Family Medicine

## 2023-09-14 ENCOUNTER — Encounter (HOSPITAL_BASED_OUTPATIENT_CLINIC_OR_DEPARTMENT_OTHER): Payer: Self-pay | Admitting: Pulmonary Disease

## 2023-09-17 ENCOUNTER — Other Ambulatory Visit (HOSPITAL_BASED_OUTPATIENT_CLINIC_OR_DEPARTMENT_OTHER): Payer: Self-pay

## 2023-09-29 ENCOUNTER — Encounter: Payer: Self-pay | Admitting: Family Medicine

## 2023-09-29 DIAGNOSIS — G2581 Restless legs syndrome: Secondary | ICD-10-CM

## 2023-09-29 DIAGNOSIS — E039 Hypothyroidism, unspecified: Secondary | ICD-10-CM

## 2023-09-29 MED ORDER — ROPINIROLE HCL 1 MG PO TABS
1.0000 mg | ORAL_TABLET | Freq: Every day | ORAL | 5 refills | Status: DC
Start: 2023-09-29 — End: 2023-10-10

## 2023-10-03 ENCOUNTER — Other Ambulatory Visit: Payer: Self-pay | Admitting: Family Medicine

## 2023-10-03 DIAGNOSIS — E039 Hypothyroidism, unspecified: Secondary | ICD-10-CM

## 2023-10-09 NOTE — Telephone Encounter (Signed)
 Called and spoke to the pharmacy,  they have filled the Requip  1 mg for her.  She recently picked up the 88 mcg of Levothyroxine .  Please review her recent labs and advise on which dose she is to be on?  Will have to call pharmacy to advise if she needs to be on the 100 mcg (refills at pharmacy already).  Dm/cma

## 2023-10-10 MED ORDER — ROPINIROLE HCL 1 MG PO TABS: 1.0000 mg | ORAL_TABLET | Freq: Every day | ORAL | 5 refills | Status: AC

## 2023-10-10 MED ORDER — LEVOTHYROXINE SODIUM 100 MCG PO TABS: 100.0000 ug | ORAL_TABLET | Freq: Every day | ORAL | 3 refills | Status: AC

## 2023-10-10 NOTE — Addendum Note (Signed)
 Addended by: THEDORA GARNETTE HERO on: 10/10/2023 01:14 PM   Modules accepted: Orders

## 2023-10-13 ENCOUNTER — Ambulatory Visit (INDEPENDENT_AMBULATORY_CARE_PROVIDER_SITE_OTHER): Admitting: Emergency Medicine

## 2023-10-13 ENCOUNTER — Ambulatory Visit: Payer: Self-pay | Admitting: *Deleted

## 2023-10-13 VITALS — BP 146/82 | HR 71 | Temp 99.0°F | Resp 16 | Ht 62.0 in | Wt 144.0 lb

## 2023-10-13 DIAGNOSIS — J40 Bronchitis, not specified as acute or chronic: Secondary | ICD-10-CM | POA: Diagnosis not present

## 2023-10-13 DIAGNOSIS — J329 Chronic sinusitis, unspecified: Secondary | ICD-10-CM

## 2023-10-13 MED ORDER — BENZONATATE 200 MG PO CAPS: 200.0000 mg | ORAL_CAPSULE | Freq: Three times a day (TID) | ORAL | 0 refills | Status: DC | PRN

## 2023-10-13 MED ORDER — DOXYCYCLINE HYCLATE 100 MG PO TABS
100.0000 mg | ORAL_TABLET | Freq: Two times a day (BID) | ORAL | 0 refills | Status: AC
Start: 2023-10-13 — End: 2023-10-20

## 2023-10-13 MED ORDER — HYDROCOD POLI-CHLORPHE POLI ER 10-8 MG/5ML PO SUER: 5.0000 mL | Freq: Two times a day (BID) | ORAL | 0 refills | Status: DC | PRN

## 2023-10-13 MED ORDER — PREDNISONE 20 MG PO TABS
40.0000 mg | ORAL_TABLET | Freq: Every day | ORAL | 0 refills | Status: DC
Start: 2023-10-13 — End: 2023-10-22

## 2023-10-13 NOTE — Telephone Encounter (Signed)
 FYI Only or Action Required?: Action required by provider: request for appointment.  Patient was last seen in primary care on 07/22/2023 by Thedora Garnette HERO, MD.  Called Nurse Triage reporting Cough (Blood in sputum-cough).  Symptoms began several days ago.  Interventions attempted: benzonatate  .  Symptoms are: gradually worsening.  Triage Disposition: See Physician Within 24 Hours  Patient/caregiver understands and will follow disposition?: Call to office- Bari took call- will work on scheduling patient.    Reason for Disposition  [1] Has underlying lung disease (e.g., COPD, chronic bronchitis or emphysema) AND [2] sputum has turned yellow or green in color  Answer Assessment - Initial Assessment Questions 1. ONSET: When did the cough begin?      10/07/23- COVID test- negative 2. SEVERITY: How bad is the cough today? Did the blood appear after a coughing spell?      Patient states home care is not working- Tussin, benzonatate   3. SPUTUM: Describe the color of your sputum (e.g., none, dry cough; clear, white, yellow, green)     Green, black, thick 4. HEMOPTYSIS: How much blood? (e.g., flecks, streaks, tablespoons)     Saw blood once- today- streak 5. DIFFICULTY BREATHING: Are you having difficulty breathing? If Yes, ask: How bad is it? (e.g., mild, moderate, severe)      Not bad with air supra 6. FEVER: Do you have a fever? If Yes, ask: What is your temperature, how was it measured, and when did it start?     Yes- Thursday- not checked 7. CARDIAC HISTORY: Do you have any history of heart disease? (e.g., heart attack, congestive heart failure)      hypertension 8. LUNG HISTORY: Do you have any history of lung disease?  (e.g., pulmonary embolus, asthma, emphysema)     Asthma- bronchitis   10. OTHER SYMPTOMS: Do you have any other symptoms? (e.g., runny nose, wheezing, chest pain)       Nasal congestion, deep sore throat  Protocols used: Coughing Up  Blood-A-AH   Copied from CRM #[id number]. Topic: Clinical - Red Word Triage >> Oct 13, 2023  9:23 AM Mesmerise C wrote: [facility] that prompted transfer to Nurse Triage: Patient is coughing up blood, says when she coughs it's brown or some mucus

## 2023-10-13 NOTE — Addendum Note (Signed)
 Addended by: THEDORA GARNETTE HERO on: 10/13/2023 12:36 PM   Modules accepted: Orders

## 2023-10-13 NOTE — Telephone Encounter (Signed)
 Scheduled today. Dm/cma

## 2023-10-13 NOTE — Patient Instructions (Signed)
 Prednisone  to calm airway swelling and inflammation- this will also help the ears Doxycycline  to cover for bacterial infection Benzonatate  can help calm cough, and should not make you sleepy Continue your inhalers as prescribed by pulmonology I sent a message to Dr Thedora about cough syrup for nighttime, as discussed Urgent recheck if your breathing suddenly gets worse

## 2023-10-13 NOTE — Progress Notes (Signed)
 Assessment & Plan:  1. Sinobronchitis (Primary) - doxycycline  (VIBRA -TABS) 100 MG tablet; Take 1 tablet (100 mg total) by mouth 2 (two) times daily for 7 days.  Dispense: 14 tablet; Refill: 0 - predniSONE  (DELTASONE ) 20 MG tablet; Take 2 tablets (40 mg total) by mouth daily.  Dispense: 10 tablet; Refill: 0 - benzonatate  (TESSALON ) 200 MG capsule; Take 1 capsule (200 mg total) by mouth 3 (three) times daily as needed for cough.  Dispense: 20 capsule; Refill: 0  Scattered faint wheeze on exam Increasing head congestion almost 1w of sx suspect sinusitis with PND contributing to airway swelling.  Rx as above Patient requests tussionex- discussed not a medication I typically prescribe but did speak with her PCP who will refill for her.  Cont good hydration.  Patient Instructions  Prednisone  to calm airway swelling and inflammation- this will also help the ears Doxycycline  to cover for bacterial infection Benzonatate  can help calm cough, and should not make you sleepy Continue your inhalers as prescribed by pulmonology I sent a message to Dr Thedora about cough syrup for nighttime, as discussed Urgent recheck if your breathing suddenly gets worse   No results found for any visits on 10/13/23.  Follow up plan: PRN  Corean Geralds, MSPAS, PA-C   Subjective:  HPI: Kimberly [name] is a 73 y.o. female presenting on 10/13/2023 for Cough (Productive cough and congestion x 5 days. Negative COVID tests at home. )  Patient complains of cough and head congestion, fever initially but none in the last 72h. She denies chest pain, severe shortness of breath. Onset of symptoms was 6 days ago, gradually worsening since that time. She is drinking plenty of fluids. Evaluation to date: none. Treatment to date: APAP, tess perles, rx SABA/ICS. She has a history of asthmatic bronchitis. She does not smoke.    ROS: Negative unless specifically indicated above in HPI.   Relevant past medical history reviewed  and updated as indicated.   Allergies and medications reviewed and updated.   Current Outpatient Medications:    acetaminophen  (TYLENOL ) 500 MG tablet, Take 2 tablets (1,000 mg total) by mouth 4 (four) times daily., Disp: 120 tablet, Rfl: 3   albuterol  (VENTOLIN  HFA) 108 (90 Base) MCG/ACT inhaler, INHALE 2 PUFFS BY MOUTH EVERY 4 HOURS AS NEEDED, Disp: 8.5 g, Rfl: 3   Albuterol -Budesonide  (AIRSUPRA ) 90-80 MCG/ACT AERO, Inhale 1-2 puffs into the lungs every 6 (six) hours as needed., Disp: 10.7 g, Rfl: 2   aspirin  81 MG chewable tablet, Chew 81 mg by mouth daily., Disp: , Rfl:    benzonatate  (TESSALON ) 200 MG capsule, Take 1 capsule (200 mg total) by mouth 2 (two) times daily as needed for cough., Disp: 20 capsule, Rfl: 2   buPROPion  (WELLBUTRIN  SR) 150 MG 12 hr tablet, TAKE 1 TABLET(150 MG) BY MOUTH TWICE DAILY, Disp: 180 tablet, Rfl: 3   cholecalciferol (VITAMIN D3) 25 MCG (1000 UNIT) tablet, Take 1,000 Units by mouth daily., Disp: , Rfl:    Cranberry 1000 MG CAPS, Take 2 capsules by mouth daily., Disp: , Rfl:    famotidine (PEPCID) 20 MG tablet, Take 20 mg by mouth 2 (two) times daily., Disp: , Rfl:    ibuprofen  (ADVIL ) 600 MG tablet, Take 600 mg by mouth every 6 (six) hours as needed., Disp: , Rfl:    levothyroxine  (SYNTHROID ) 100 MCG tablet, Take 1 tablet (100 mcg total) by mouth daily at 6 (six) AM., Disp: 90 tablet, Rfl: 3   metoprolol  succinate (TOPROL -XL) 25 MG  24 hr tablet, TAKE 1 TABLET(25 MG) BY MOUTH DAILY, Disp: 90 tablet, Rfl: 3   rOPINIRole  (REQUIP ) 1 MG tablet, Take 1 tablet (1 mg total) by mouth at bedtime., Disp: 30 tablet, Rfl: 5   chlorpheniramine-HYDROcodone  (TUSSIONEX) 10-8 MG/5ML, Take 5 mLs by mouth every 12 (twelve) hours as needed for cough. (Patient not taking: Reported on 10/13/2023), Disp: 115 mL, Rfl: 0   diazepam  (VALIUM ) 5 MG tablet, TAKE 1/2 TO 1 TABLET BY MOUTH IN THE MORNING AND AT BEDTIME. MAY TAKE AN ADDITIONAL DOSE DAILY AS NEEDED (Patient not taking: Reported  on 10/13/2023), Disp: 60 tablet, Rfl: 2  Allergies  Allergen Reactions   Codeine Hives   Sulfa Antibiotics Hives and Itching    Objective:   Today's Vitals   10/13/23 1058 10/13/23 1144  BP: (!) 146/94 (!) 146/82  Pulse: 71   Resp: 16   Temp: 99 F (37.2 C)   SpO2: 95%   Weight: 144 lb (65.3 kg)   Height: 5' 2 (1.575 m)      Gen: appears well, alert, INAD Ears: Right canal normal. Right TM erythematous, dull, retracted. Left canal normal. Left TM erythematous, dull, retracted.  Nose: mucosal erythema and purulent rhinorrhea Mouth: Oral mucosa MMM. Throat: cobblestone.  Neck: supple and no adenopathy Heart RRR Lungs: Respiratory effort: scattered faint end-expiratory wheee.   Skin: Warm and dry without acute rash to exposed areas.

## 2023-10-22 ENCOUNTER — Encounter: Payer: Self-pay | Admitting: Family Medicine

## 2023-10-22 ENCOUNTER — Ambulatory Visit: Admitting: Family Medicine

## 2023-10-22 VITALS — BP 136/82 | HR 76 | Temp 97.0°F | Ht 62.0 in | Wt 143.8 lb

## 2023-10-22 DIAGNOSIS — E039 Hypothyroidism, unspecified: Secondary | ICD-10-CM | POA: Diagnosis not present

## 2023-10-22 DIAGNOSIS — G2581 Restless legs syndrome: Secondary | ICD-10-CM | POA: Diagnosis not present

## 2023-10-22 DIAGNOSIS — R252 Cramp and spasm: Secondary | ICD-10-CM

## 2023-10-22 DIAGNOSIS — Z1159 Encounter for screening for other viral diseases: Secondary | ICD-10-CM

## 2023-10-22 DIAGNOSIS — I10 Essential (primary) hypertension: Secondary | ICD-10-CM | POA: Diagnosis not present

## 2023-10-22 DIAGNOSIS — F411 Generalized anxiety disorder: Secondary | ICD-10-CM | POA: Diagnosis not present

## 2023-10-22 MED ORDER — DIAZEPAM 5 MG PO TABS: ORAL_TABLET | ORAL | 2 refills | Status: DC

## 2023-10-22 NOTE — Assessment & Plan Note (Signed)
 Kimberly [name] systolic BP is high today, but her diastolic is a bit low. It is not clear that she would tolerate additional BP lowering at this point. Continue metoprolol  succinate 25 mg daily.

## 2023-10-22 NOTE — Assessment & Plan Note (Signed)
Stable. Continue bupropion SR 150 mg bid and PRN Valium.

## 2023-10-22 NOTE — Assessment & Plan Note (Signed)
 Improved. Continue ropinirole  1 mg at bedtime.

## 2023-10-22 NOTE — Progress Notes (Signed)
 [facility] [address] [place] [address] Dept: [phone] Dept Fax: [phone]  Chronic Care Office Visit  Subjective:    Patient ID: Kimberly [name], female    DOB: 14-Jun-1950, 73 y.o..   MRN: [id number]  Chief Complaint  Patient presents with   Hypertension    3 month f/u.  Refills on Diazepam .  C/o having pain in lower leg.    History of Present Illness:  Patient is in today for reassessment of chronic medical issues.  Kimberly [name] has a history of essential hypertension. She is managed on metoprolol  succinate 25 mg daily.    Kimberly [name] has a history of hypothyroidism. She is managed on levothyroxine  100 mcg daily. This was increased after her last visit. She feels like her energy level is improved with this.  Kimberly [name] has a history of an anxiety disorder. She is managed on periodic diazepam . She does nto take this on a daily basis. She does feel that this reduces episodes of agitation, which can overwhelm her at times.  Kimberly [name] has a history of restless leg syndrome. She is managed on ropinirole  1 mg at bedtime. She is finding this works much better tha when she was on a higher dose.  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   SUI (stress urinary incontinence, female) 03/23/2021   Hiatal hernia 09/22/2020   GERD (gastroesophageal reflux disease) 09/22/2020   Diverticulosis 09/22/2020   Osteopenia 09/08/2020   Restless leg syndrome 06/05/2020   Hypothyroidism 03/29/2020   Essential hypertension 03/29/2020   Kidney stones 03/29/2020   Asthmatic bronchitis , chronic (HCC) 03/29/2020   Anxiety disorder 03/29/2020   Past Surgical History:  Procedure Laterality Date   ABDOMINAL HYSTERECTOMY  1980   ANTERIOR AND POSTERIOR VAGINAL REPAIR W/ SACROSPINOUS LIGAMENT SUSPENSION  06/29/2021   CHOLECYSTECTOMY Bilateral 11/25/2022    Procedure: LAPAROSCOPIC CHOLECYSTECTOMY;  Surgeon: Paola Dreama SAILOR, MD;  Location: [facility] OR;  Service: General;  Laterality: Bilateral;   HERNIA REPAIR  2006?   PARTIAL HYSTERECTOMY  1980   Pre-cancerous condition. Failed laser surgery. still have ovaries   UMBILICAL HERNIA REPAIR  2004   with mesh   WRIST SURGERY Left 2019   Family History  Problem Relation Age of Onset   Diabetes Mother    Breast cancer Mother    Colon cancer Father    Kidney failure Father    Kidney disease Father    COPD Brother    Diabetes Brother    Heart disease Brother    Heart disease Maternal Uncle    Heart disease Daughter    Depression Son    Esophageal cancer Neg Hx    Stomach cancer Neg Hx    Rectal cancer Neg Hx    Outpatient Medications Prior to Visit  Medication Sig Dispense Refill   acetaminophen  (TYLENOL ) 500 MG tablet Take 2 tablets (1,000 mg total) by mouth 4 (four) times daily. 120 tablet 3   albuterol  (VENTOLIN  HFA) 108 (90 Base) MCG/ACT inhaler INHALE 2 PUFFS BY MOUTH EVERY 4 HOURS AS NEEDED 8.5 g 3   Albuterol -Budesonide  (AIRSUPRA ) 90-80 MCG/ACT AERO Inhale 1-2 puffs into the lungs every 6 (six) hours as needed. 10.7 g 2   aspirin  81 MG chewable tablet Chew 81 mg by mouth daily.     benzonatate  (TESSALON ) 200 MG capsule Take 1 capsule (200 mg total) by mouth 3 (three) times daily as needed  for cough. 20 capsule 0   buPROPion  (WELLBUTRIN  SR) 150 MG 12 hr tablet TAKE 1 TABLET(150 MG) BY MOUTH TWICE DAILY 180 tablet 3   chlorpheniramine-HYDROcodone  (TUSSIONEX) 10-8 MG/5ML Take 5 mLs by mouth every 12 (twelve) hours as needed for cough. 70 mL 0   cholecalciferol (VITAMIN D3) 25 MCG (1000 UNIT) tablet Take 1,000 Units by mouth daily.     Cranberry 1000 MG CAPS Take 2 capsules by mouth daily.     famotidine (PEPCID) 20 MG tablet Take 20 mg by mouth 2 (two) times daily.     ibuprofen  (ADVIL ) 600 MG tablet Take 600 mg by mouth every 6 (six) hours as needed.     levothyroxine  (SYNTHROID ) 100 MCG  tablet Take 1 tablet (100 mcg total) by mouth daily at 6 (six) AM. 90 tablet 3   metoprolol  succinate (TOPROL -XL) 25 MG 24 hr tablet TAKE 1 TABLET(25 MG) BY MOUTH DAILY 90 tablet 3   rOPINIRole  (REQUIP ) 1 MG tablet Take 1 tablet (1 mg total) by mouth at bedtime. 30 tablet 5   chlorpheniramine-HYDROcodone  (TUSSIONEX) 10-8 MG/5ML Take 5 mLs by mouth every 12 (twelve) hours as needed for cough. (Patient not taking: Reported on 10/22/2023) 115 mL 0   predniSONE  (DELTASONE ) 20 MG tablet Take 2 tablets (40 mg total) by mouth daily. 10 tablet 0   diazepam  (VALIUM ) 5 MG tablet TAKE 1/2 TO 1 TABLET BY MOUTH IN THE MORNING AND AT BEDTIME. MAY TAKE AN ADDITIONAL DOSE DAILY AS NEEDED (Patient not taking: Reported on 10/22/2023) 60 tablet 2   No facility-administered medications prior to visit.   Allergies  Allergen Reactions   Codeine Hives   Sulfa Antibiotics Hives and Itching   Objective:   Today's Vitals   10/22/23 0958  BP: (!) 144/68  Pulse: 76  Temp: (!) 97 F (36.1 C)  TempSrc: Temporal  SpO2: 98%  Weight: 143 lb 12.8 oz (65.2 kg)  Height: 5' 2 (1.575 m)   Body mass index is 26.3 kg/m.   General: Well developed, well nourished. No acute distress. Lungs: Clear to auscultation bilaterally. No wheezing, rales or rhonchi. CV: RRR without murmurs or rubs. Pulses 2+ bilaterally. Psych: Alert and oriented. Normal mood and affect.  Health Maintenance Due  Topic Date Due   Hepatitis C Screening  Never done   Pneumococcal Vaccine: 50+ Years (1 of 2 - PCV) Never done   Zoster Vaccines- Shingrix (1 of 2) Never done     Assessment & Plan:   Problem List Items Addressed This Visit       Cardiovascular and Mediastinum   Essential hypertension - Primary   Kimberly [name]'s systolic BP is high today, but her diastolic is a bit low. It is not clear that she would tolerate additional BP lowering at this point. Continue metoprolol  succinate 25 mg daily.      Relevant Orders   Basic metabolic  panel with GFR     Endocrine   Hypothyroidism   I will reassess her TSH today. Continue levothyroxine  100 mcg daily.      Relevant Orders   TSH     Other   Anxiety disorder   Stable. Continue bupropion  SR 150 mg bid and PRN Valium .      Relevant Medications   diazepam  (VALIUM ) 5 MG tablet   Restless leg syndrome   Improved. Continue ropinirole  1 mg at bedtime.       Other Visit Diagnoses       Leg cramps  I will check her potassium, as it has been borderline low in the past.   Relevant Orders   Basic metabolic panel with GFR     Encounter for hepatitis C screening test for low risk patient       Relevant Orders   HCV Ab w Reflex to Quant PCR       Return in about 4 months (around 02/21/2024) for Reassessment.   Garnette CHRISTELLA Simpler, MD

## 2023-10-22 NOTE — Assessment & Plan Note (Addendum)
 I will reassess her TSH today. Continue levothyroxine  100 mcg daily.

## 2023-10-23 ENCOUNTER — Ambulatory Visit: Payer: Self-pay | Admitting: Family Medicine

## 2023-10-23 LAB — BASIC METABOLIC PANEL WITH GFR
BUN: 16 mg/dL (ref 6–23)
CO2: 31 meq/L (ref 19–32)
Calcium: 9.8 mg/dL (ref 8.4–10.5)
Chloride: 99 meq/L (ref 96–112)
Creatinine, Ser: 0.67 mg/dL (ref 0.40–1.20)
GFR: 87.61 mL/min (ref >60.00)
Glucose, Bld: 97 mg/dL (ref 70–99)
Potassium: 5.6 meq/L — ABNORMAL HIGH (ref 3.5–5.1)
Sodium: 140 meq/L (ref 135–145)

## 2023-10-23 LAB — TSH: TSH: 3.67 u[IU]/mL (ref 0.35–5.50)

## 2023-10-24 LAB — HCV AB W REFLEX TO QUANT PCR: HCV Ab: NONREACTIVE

## 2023-10-24 LAB — HCV INTERPRETATION

## 2023-12-03 ENCOUNTER — Other Ambulatory Visit: Payer: Self-pay | Admitting: Family Medicine

## 2023-12-03 DIAGNOSIS — I10 Essential (primary) hypertension: Secondary | ICD-10-CM

## 2023-12-09 ENCOUNTER — Encounter (HOSPITAL_BASED_OUTPATIENT_CLINIC_OR_DEPARTMENT_OTHER): Payer: Self-pay | Admitting: Pulmonary Disease

## 2023-12-09 ENCOUNTER — Other Ambulatory Visit (HOSPITAL_BASED_OUTPATIENT_CLINIC_OR_DEPARTMENT_OTHER): Payer: Self-pay

## 2023-12-09 ENCOUNTER — Other Ambulatory Visit: Payer: Self-pay

## 2023-12-09 ENCOUNTER — Telehealth (HOSPITAL_BASED_OUTPATIENT_CLINIC_OR_DEPARTMENT_OTHER): Payer: Self-pay | Admitting: Pulmonary Disease

## 2023-12-09 MED ORDER — AIRSUPRA 90-80 MCG/ACT IN AERO
1.0000 | INHALATION_SPRAY | Freq: Four times a day (QID) | RESPIRATORY_TRACT | 2 refills | Status: DC | PRN
  Filled 2023-12-09 (×2): qty 10.7, 30d supply, fill #0

## 2023-12-09 NOTE — Telephone Encounter (Signed)
 Copied from CRM [id number]. Topic: Clinical - Medication Refill >> Dec 09, 2023  1:11 PM Joesph PARAS wrote: Medication:  Albuterol -Budesonide  (AIRSUPRA ) 90-80 MCG/ACT AERO   Has the patient contacted their pharmacy? Yes - Patient states needs refill  This is the patient's preferred pharmacy:   [facility] [address] [place] [address] Phone: [phone] Fax: [phone]  Is this the correct pharmacy for this prescription? Yes If no, delete pharmacy and type the correct one.   Has the prescription been filled recently? No  Is the patient out of the medication? Yes - Has been out of this for three to four weeks.   Has the patient been seen for an appointment in the last year OR does the patient have an upcoming appointment? Yes  Can we respond through MyChart? No  Agent: Please be advised that Rx refills may take up to 3 business days. We ask that you follow-up with your pharmacy.

## 2023-12-09 NOTE — Telephone Encounter (Signed)
 RX sent to pharmacy

## 2023-12-15 ENCOUNTER — Encounter: Payer: Self-pay | Admitting: Family Medicine

## 2023-12-17 ENCOUNTER — Telehealth: Payer: Self-pay

## 2023-12-17 NOTE — Telephone Encounter (Signed)
 Pt states she is returning a call to [facility], from yesterday, 12/16/23.

## 2023-12-17 NOTE — Telephone Encounter (Signed)
 Patient notified VIA phone  and she does have an appointment for her cataracts.  No further questions.  Dm/cma

## 2024-01-08 ENCOUNTER — Ambulatory Visit (HOSPITAL_BASED_OUTPATIENT_CLINIC_OR_DEPARTMENT_OTHER): Admitting: Pulmonary Disease

## 2024-01-08 ENCOUNTER — Encounter (HOSPITAL_BASED_OUTPATIENT_CLINIC_OR_DEPARTMENT_OTHER): Payer: Self-pay | Admitting: Pulmonary Disease

## 2024-01-08 ENCOUNTER — Other Ambulatory Visit (HOSPITAL_BASED_OUTPATIENT_CLINIC_OR_DEPARTMENT_OTHER): Payer: Self-pay

## 2024-01-08 VITALS — BP 157/97 | HR 73 | Ht 61.0 in | Wt 149.5 lb

## 2024-01-08 DIAGNOSIS — R490 Dysphonia: Secondary | ICD-10-CM

## 2024-01-08 DIAGNOSIS — J45909 Unspecified asthma, uncomplicated: Secondary | ICD-10-CM

## 2024-01-08 DIAGNOSIS — Z23 Encounter for immunization: Secondary | ICD-10-CM

## 2024-01-08 MED ORDER — OPTICHAMBER DIAMOND-LG MASK DEVI
1 refills | Status: DC
  Filled 2024-01-08: qty 1, 30d supply, fill #0

## 2024-01-08 MED ORDER — AIRSUPRA 90-80 MCG/ACT IN AERO
1.0000 | INHALATION_SPRAY | Freq: Four times a day (QID) | RESPIRATORY_TRACT | 5 refills | Status: AC | PRN
  Filled 2024-01-08: qty 10.7, 30d supply, fill #0

## 2024-01-08 NOTE — Progress Notes (Signed)
 Subjective:   PATIENT ID: Kimberly [name] GENDER: female DOB: [birth year], MRN: [id number]  Chief Complaint  Patient presents with   Follow-up    1month f/u    Reason for Visit: Follow-up  [name] is 73 year old female former smoker with asthmatic bronchitis, HTN, hypothyroidism, barrett esophagus, GERD, hx uterine cancer s/p partial hysterectomy, RLS who presents for follow-up  Initial consult Recent hospitalization for COVID from 07/19/22-07/22/22. Treated with decadron . Her cough has improved but still having two episodes lasting 15 min a day associated with thick sputum. Some wheezing associated. Has shortness of breath with long distances. Worse with heat. Improves with air conditioning and tussionex. Has had chest pain post cough that is significant  She reports longstanding history of asthmatic bronchitis/asthma since childhood. Hx of hives.Reports annual URI. Never had a hospitalization related to lung issues until this year. [place].  02/04/23 Since our last visit she had resolved cough. Had relief from Airsupra  but could not afford that or Wixela. Also felt like Wixela caused her to break our on her face/mouth. She reports wheezing and worsening shortness of breath that began a few weeks ago. Denies fevers/chills. No productive cough.  06/27/23 Since our last visit she has been Advair  two puffs twice a day. Rarely uses AirSupra . She does have shortness of breath but no coughing or wheezing. Pollen will trigger her so prefers to stay indoors. Has some hoarseness with inhaler. She is sedentary at baseline.   01/08/24 Since our last visit she reports her respiratory status is overall well doing well. However has hoarseness. Has been using Airsupra  twice a day and this has resolved her cough. No exacerbations since our last visit. Wanting to sing in the choir again. No wheezing or shortness of breath.  Social History: Former smoker. Quit  in 1995/1996 Retired audiological scientist estate business Drinks 4 glasses a month  Past Medical History:  Diagnosis Date   Allergy 2020   Sulphar drugs hives   Anxiety    Asthma    Barrett esophagus    Chronic headaches    Colon polyps    COVID 03/2020   Depression    GERD (gastroesophageal reflux disease)    Hypertension    Kidney stones    Pneumonia    Thyroid  disease    Uterine cancer (HCC) 1980     Family History  Problem Relation Age of Onset   Diabetes Mother    Breast cancer Mother    Colon cancer Father    Kidney failure Father    Kidney disease Father    COPD Brother    Diabetes Brother    Heart disease Brother    Heart disease Maternal Uncle    Heart disease Daughter    Depression Son    Esophageal cancer Neg Hx    Stomach cancer Neg Hx    Rectal cancer Neg Hx      Social History   Occupational History   Occupation: retired    Comment: Retired  Tobacco Use   Smoking status: Former    Current packs/day: 0.00    Types: Cigarettes    Quit date: 1995    Years since quitting: 30.8   Smokeless tobacco: Never  Vaping Use   Vaping status: Never Used  Substance and Sexual Activity   Alcohol  use: Not Currently    Alcohol /week: 1.0 standard drink of alcohol     Types: 1 Cans of beer per week  Comment: 1 week   Drug use: Never   Sexual activity: Not Currently    Birth control/protection: Surgical    Comment: Hyst    Allergies  Allergen Reactions   Codeine Hives   Sulfa Antibiotics Hives and Itching     Outpatient Medications Prior to Visit  Medication Sig Dispense Refill   albuterol  (VENTOLIN  HFA) 108 (90 Base) MCG/ACT inhaler INHALE 2 PUFFS BY MOUTH EVERY 4 HOURS AS NEEDED 8.5 g 3   aspirin  81 MG chewable tablet Chew 81 mg by mouth daily.     benzonatate  (TESSALON ) 200 MG capsule Take 1 capsule (200 mg total) by mouth 3 (three) times daily as needed for cough. 20 capsule 0   buPROPion  (WELLBUTRIN  SR) 150 MG 12 hr tablet TAKE 1 TABLET(150 MG) BY MOUTH  TWICE DAILY 180 tablet 3   chlorpheniramine-HYDROcodone  (TUSSIONEX) 10-8 MG/5ML Take 5 mLs by mouth every 12 (twelve) hours as needed for cough. 70 mL 0   cholecalciferol (VITAMIN D3) 25 MCG (1000 UNIT) tablet Take 1,000 Units by mouth daily.     Cranberry 1000 MG CAPS Take 2 capsules by mouth daily.     diazepam  (VALIUM ) 5 MG tablet TAKE 1/2 TO 1 TABLET BY MOUTH IN THE MORNING AND AT BEDTIME. MAY TAKE AN ADDITIONAL DOSE DAILY AS NEEDED 60 tablet 2   famotidine (PEPCID) 20 MG tablet Take 20 mg by mouth 2 (two) times daily.     ibuprofen  (ADVIL ) 600 MG tablet Take 600 mg by mouth every 6 (six) hours as needed.     levothyroxine  (SYNTHROID ) 100 MCG tablet Take 1 tablet (100 mcg total) by mouth daily at 6 (six) AM. 90 tablet 3   metoprolol  succinate (TOPROL -XL) 25 MG 24 hr tablet TAKE 1 TABLET(25 MG) BY MOUTH DAILY 90 tablet 3   phentermine (ADIPEX-P) 37.5 MG tablet Take 37.5 mg by mouth daily.     rOPINIRole  (REQUIP ) 1 MG tablet Take 1 tablet (1 mg total) by mouth at bedtime. 30 tablet 5   Albuterol -Budesonide  (AIRSUPRA ) 90-80 MCG/ACT AERO Inhale 1-2 puffs into the lungs every 6 (six) hours as needed. 10.7 g 2   No facility-administered medications prior to visit.    Review of Systems  Constitutional:  Negative for chills, diaphoresis, fever, malaise/fatigue and weight loss.  HENT:  Negative for congestion.        Hoarsness  Respiratory:  Negative for cough, hemoptysis, sputum production, shortness of breath and wheezing.   Cardiovascular:  Negative for chest pain, palpitations and leg swelling.     Objective:   Vitals:   01/08/24 1335  BP: (!) 157/97  Pulse: 73  SpO2: 100%  Weight: 149 lb 8 oz (67.8 kg)  Height: 5' 1 (1.549 m)   SpO2: 100 %  Physical Exam: General: Well-appearing, no acute distress HENT: , AT, hoarseness Eyes: EOMI, no scleral icterus Respiratory: Clear to auscultation bilaterally.  No crackles, wheezing or rales Cardiovascular: RRR, -M/R/G, no  JVD Extremities:-Edema,-tenderness Neuro: AAO x4, CNII-XII grossly intact Psych: Normal mood, normal affect  Data Reviewed:  Imaging: CXR 08/13/22 - No infiltrate of effusion or edema  PFT: None on file  Labs: CBC    Component Value Date/Time   WBC 6.4 11/21/2022 1000   RBC 4.59 11/21/2022 1000   HGB 12.6 11/21/2022 1000   HCT 40.2 11/21/2022 1000   PLT 179 11/21/2022 1000   MCV 87.6 11/21/2022 1000   MCH 27.5 11/21/2022 1000   MCHC 31.3 11/21/2022 1000   RDW 12.4 11/21/2022  1000   LYMPHSABS 2.3 08/13/2022 1331   MONOABS 0.5 08/13/2022 1331   EOSABS 0.3 08/13/2022 1331   BASOSABS 0.0 08/13/2022 1331        Assessment & Plan:   Discussion: 73 year old female former smoker with asthmatic bronchitis, HTN, hypothyroidism, barrett esophagus, GERD, hx uterine cancer s/p partial hysterectomy, RLS who presents for follow-up for asthmatic bronchitis. Hoarseness secondary to inhalers. Previously seen for asthmatic bronchitis likely exacerbated by recent covid infection. Symptoms improved on Airsupra    Unable to tolerate Wixela. Now cannot tolerate powder inhalers due to rash.  Asthmatic bronchitis --STOP brand name Advair  HFA  --CONTINUE AirSupra  sample 1-2 puffs in the morning and evening. Rinse mouth out after use  >Use with spacer to help with hoarsness  Hoarseness Secondary to ICS inhaler --Provide spacer  Health Maintenance Immunization History  Administered Date(s) Administered   Moderna Covid-19 Vaccine Bivalent Booster 56yrs & up 12/26/2020   PFIZER Comirnaty(Gray Top)Covid-19 Tri-Sucrose Vaccine 05/12/2019, 06/13/2019   Td 03/04/2016   Zoster Recombinant(Shingrix) 12/29/2023   CT Lung Screen - not qualified. Quit >15 years ago  No orders of the defined types were placed in this encounter.  Meds ordered this encounter  Medications   Spacer/Aero-Holding Chambers (OPTICHAMBER DIAMOND-LG MASK) DEVI    Sig: Use as directed with inhaler.    Dispense:  1 each     Refill:  1   Albuterol -Budesonide  (AIRSUPRA ) 90-80 MCG/ACT AERO    Sig: Inhale 1-2 puffs into the lungs every 6 (six) hours as needed.    Dispense:  10.7 g    Refill:  5    Return in about 6 months (around 07/07/2024).  I have spent a total time of 32-minutes on the day of the appointment including chart review, data review, collecting history, coordinating care and discussing medical diagnosis and plan with the patient/family. Past medical history, allergies, medications were reviewed. Pertinent imaging, labs and tests included in this note have been reviewed and interpreted independently by me.   Nayeli Calvert Slater Staff, MD [facility] 01/08/2024 4:44 PM

## 2024-01-08 NOTE — Patient Instructions (Signed)
 Asthmatic bronchitis --STOP brand name Advair  HFA  --CONTINUE AirSupra  sample 1-2 puffs in the morning and evening. Rinse mouth out after use  >Use with spacer to help with hoarsness  Hoarseness Secondary to ICS inhaler --Provide spacer

## 2024-01-28 ENCOUNTER — Encounter: Payer: Self-pay | Admitting: Family Medicine

## 2024-01-28 MED ORDER — NYSTATIN 100000 UNIT/ML MT SUSP: 5.0000 mL | Freq: Four times a day (QID) | OROMUCOSAL | 0 refills | Status: DC

## 2024-01-28 NOTE — Telephone Encounter (Signed)
 Please review and advise on my chart message.  Thanks. Dm/cma

## 2024-02-02 ENCOUNTER — Encounter: Payer: Self-pay | Admitting: Family Medicine

## 2024-02-02 NOTE — Telephone Encounter (Signed)
 Called and scheduled patient an appointment on 02/05/24 @ 10:00 am. Dm/cma

## 2024-02-05 ENCOUNTER — Encounter: Payer: Self-pay | Admitting: Family Medicine

## 2024-02-05 ENCOUNTER — Ambulatory Visit: Admitting: Family Medicine

## 2024-02-05 VITALS — BP 130/76 | HR 78 | Temp 98.1°F | Ht 61.0 in | Wt 145.2 lb

## 2024-02-05 DIAGNOSIS — G250 Essential tremor: Secondary | ICD-10-CM | POA: Insufficient documentation

## 2024-02-05 DIAGNOSIS — L719 Rosacea, unspecified: Secondary | ICD-10-CM | POA: Insufficient documentation

## 2024-02-05 DIAGNOSIS — I10 Essential (primary) hypertension: Secondary | ICD-10-CM | POA: Diagnosis not present

## 2024-02-05 DIAGNOSIS — J4489 Other specified chronic obstructive pulmonary disease: Secondary | ICD-10-CM

## 2024-02-05 MED ORDER — METRONIDAZOLE 0.75 % EX GEL: 1.0000 | Freq: Two times a day (BID) | CUTANEOUS | 0 refills | Status: AC

## 2024-02-05 MED ORDER — BENZONATATE 200 MG PO CAPS: 200.0000 mg | ORAL_CAPSULE | Freq: Three times a day (TID) | ORAL | 2 refills | Status: AC | PRN

## 2024-02-05 MED ORDER — DIAZEPAM 5 MG PO TABS: ORAL_TABLET | ORAL | Status: DC

## 2024-02-05 MED ORDER — HYDROCODONE BIT-HOMATROP MBR 5-1.5 MG/5ML PO SOLN: 5.0000 mL | Freq: Three times a day (TID) | ORAL | 0 refills | Status: AC | PRN

## 2024-02-05 NOTE — Assessment & Plan Note (Signed)
 Ms. Schall BP is in good control. Continue metoprolol  25 mg daily.

## 2024-02-05 NOTE — Assessment & Plan Note (Signed)
 It is reasonable for her to use diazepam  for management. She should keep this to 5 mg 1/2 tab 2-3 times a day.

## 2024-02-05 NOTE — Assessment & Plan Note (Signed)
 Continue albuterol -budesonide  (Airsupra ) and PRN albuterol . I will continue use of benzonatate  for management of her cough with judicious use of hydrocodone  syrup for breakthrough.

## 2024-02-05 NOTE — Progress Notes (Signed)
 [facility] [address] [place] [address] Dept: [phone] Dept Fax: [phone]  Office Visit  Subjective:    Patient ID: Kimberly [name], female    DOB: Dec 15, 1950, 73 y.o..   MRN: [id number]  Chief Complaint  Patient presents with   Rash    C/o having rash on the LT side of her face and also still having a cough.    History of Present Illness:  Patient is in today for a rash on her face. On 02/02/2024, Kimberly [name] reached out to ask if Nystatin , which she was using for Thrush, can cause the appearance of scabby bumps. She has tried using triamcinolone  cream, but she still notes the lesions, primarily on the left cheek. She notes she is using a moisturizing cream, but otherwise ahs not changed her facial products.  Kimberly [name] has had a history of a chronic cough that appears to be related to asthmatic bronchitis/post-COVID bronchitis. She has Airsupra  available for intermittent use. She has managed her cough with benzonatate  and occasional hydrocodoen cough syrup use.  Kimberly [name] has a history of generalized anxiety. She has had a long-standing prescription for diazepam . More recently, she has started to notice a tremor occurring. She has two family members with a history of an essential/intention tremor. She finds the diazepam  does seem to help reduce the tremor. She takes 5 mg 1/2 tab 2-3 times a day.  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   SUI (stress urinary incontinence, female) 03/23/2021   Hiatal hernia 09/22/2020   GERD (gastroesophageal reflux disease) 09/22/2020   Diverticulosis 09/22/2020   Osteopenia 09/08/2020   Restless leg syndrome 06/05/2020   Hypothyroidism 03/29/2020   Essential hypertension 03/29/2020   Kidney stones 03/29/2020   Asthmatic bronchitis , chronic (HCC) 03/29/2020   Anxiety disorder 03/29/2020   Past  Surgical History:  Procedure Laterality Date   ABDOMINAL HYSTERECTOMY  1980   ANTERIOR AND POSTERIOR VAGINAL REPAIR W/ SACROSPINOUS LIGAMENT SUSPENSION  06/29/2021   CHOLECYSTECTOMY Bilateral 11/25/2022   Procedure: LAPAROSCOPIC CHOLECYSTECTOMY;  Surgeon: Paola Dreama SAILOR, MD;  Location: [facility] OR;  Service: General;  Laterality: Bilateral;   HERNIA REPAIR  2006?   PARTIAL HYSTERECTOMY  1980   Pre-cancerous condition. Failed laser surgery. still have ovaries   UMBILICAL HERNIA REPAIR  2004   with mesh   WRIST SURGERY Left 2019   Family History  Problem Relation Age of Onset   Diabetes Mother    Breast cancer Mother    Colon cancer Father    Kidney failure Father    Kidney disease Father    COPD Brother    Diabetes Brother    Heart disease Brother    Heart disease Maternal Uncle    Heart disease Daughter    Depression Son    Esophageal cancer Neg Hx    Stomach cancer Neg Hx    Rectal cancer Neg Hx    Outpatient Medications Prior to Visit  Medication Sig Dispense Refill   albuterol  (VENTOLIN  HFA) 108 (90 Base) MCG/ACT inhaler INHALE 2 PUFFS BY MOUTH EVERY 4 HOURS AS NEEDED 8.5 g 3   Albuterol -Budesonide  (AIRSUPRA ) 90-80 MCG/ACT AERO Inhale 1-2 puffs into the lungs every 6 (six) hours as needed. 10.7 g 5   aspirin  81 MG chewable tablet Chew 81 mg by mouth daily.     benzonatate  (TESSALON ) 200 MG capsule Take 1 capsule (200 mg total) by  mouth 3 (three) times daily as needed for cough. 20 capsule 0   buPROPion  (WELLBUTRIN  SR) 150 MG 12 hr tablet TAKE 1 TABLET(150 MG) BY MOUTH TWICE DAILY 180 tablet 3   chlorpheniramine-HYDROcodone  (TUSSIONEX) 10-8 MG/5ML Take 5 mLs by mouth every 12 (twelve) hours as needed for cough. 70 mL 0   cholecalciferol (VITAMIN D3) 25 MCG (1000 UNIT) tablet Take 1,000 Units by mouth daily.     Cranberry 1000 MG CAPS Take 2 capsules by mouth daily.     diazepam  (VALIUM ) 5 MG tablet TAKE 1/2 TO 1 TABLET BY MOUTH IN THE MORNING AND AT BEDTIME. MAY TAKE AN  ADDITIONAL DOSE DAILY AS NEEDED 60 tablet 2   famotidine (PEPCID) 20 MG tablet Take 20 mg by mouth 2 (two) times daily.     ibuprofen  (ADVIL ) 600 MG tablet Take 600 mg by mouth every 6 (six) hours as needed.     levothyroxine  (SYNTHROID ) 100 MCG tablet Take 1 tablet (100 mcg total) by mouth daily at 6 (six) AM. 90 tablet 3   metoprolol  succinate (TOPROL -XL) 25 MG 24 hr tablet TAKE 1 TABLET(25 MG) BY MOUTH DAILY 90 tablet 3   nystatin  (MYCOSTATIN ) 100000 UNIT/ML suspension Take 5 mLs (500,000 Units total) by mouth 4 (four) times daily. 60 mL 0   phentermine (ADIPEX-P) 37.5 MG tablet Take 37.5 mg by mouth daily.     rOPINIRole  (REQUIP ) 1 MG tablet Take 1 tablet (1 mg total) by mouth at bedtime. 30 tablet 5   Spacer/Aero-Holding Chambers (OPTICHAMBER DIAMOND -LG MASK) DEVI Use as directed with inhaler. 1 each 1   No facility-administered medications prior to visit.   Allergies  Allergen Reactions   Codeine Hives   Sulfa Antibiotics Hives and Itching     Objective:   Today's Vitals   02/05/24 0952  BP: 130/76  Pulse: 78  Temp: 98.1 F (36.7 C)  TempSrc: Temporal  SpO2: 97%  Weight: 145 lb 3.2 oz (65.9 kg)  Height: 5' 1 (1.549 m)   Body mass index is 27.44 kg/m.   General: Well developed, well nourished. No acute distress. Lungs: Clear to auscultation bilaterally. No wheezing, rales or rhonchi. Skin: Warm and dry. There are several small superficial pustules with surrounding erythema on the left cheek. There are   1-2 small erythematous papules on the right cheek. There is a generalized redness of the nose.  Neuro: Note recurrent tremor of fingers with movement. No resting tremor noted. Psych: Alert and oriented. Normal mood and affect.  Health Maintenance Due  Topic Date Due   COVID-19 Vaccine (4 - 2025-26 season) 11/03/2023   Medicare Annual Wellness (AWV)  03/16/2024   Assessment & Plan:   Problem List Items Addressed This Visit       Cardiovascular and Mediastinum    Essential hypertension   Kimberly [name]'s BP is in good control. Continue metoprolol  25 mg daily.        Respiratory   Asthmatic bronchitis , chronic (HCC)   Continue albuterol -budesonide  (Airsupra ) and PRN albuterol . I will continue use of benzonatate  for management of her cough with judicious use of hydrocodone  syrup for breakthrough.      Relevant Medications   benzonatate  (TESSALON ) 200 MG capsule   HYDROcodone  bit-homatropine (HYCODAN) 5-1.5 MG/5ML syrup     Nervous and Auditory   Essential tremor   It is reasonable for her to use diazepam  for management. She should keep this to 5 mg 1/2 tab 2-3 times a day.  Relevant Medications   diazepam  (VALIUM ) 5 MG tablet     Other   Rosacea, acne - Primary   The facial rash has features suggestive of rosacea. I will try her on metronidazole gel twice daily to control this.      Relevant Medications   metroNIDAZOLE (METROGEL) 0.75 % gel    Return in about 3 months (around 05/05/2024).    Garnette CHRISTELLA Simpler, MD  I,Emily Lagle,acting as a scribe for Garnette CHRISTELLA Simpler, MD.,have documented all relevant documentation on the behalf of Garnette CHRISTELLA Simpler, MD.  I, Garnette CHRISTELLA Simpler, MD, have reviewed all documentation for this visit. The documentation on 02/05/2024 for the exam, diagnosis, procedures, and orders are all accurate and complete.

## 2024-02-05 NOTE — Assessment & Plan Note (Signed)
 The facial rash has features suggestive of rosacea. I will try her on metronidazole gel twice daily to control this.

## 2024-03-10 ENCOUNTER — Ambulatory Visit
Admission: RE | Admit: 2024-03-10 | Discharge: 2024-03-10 | Disposition: A | Discharge status: Home / Self Care | Source: Ambulatory Visit | Attending: Student | Admitting: Student

## 2024-03-10 ENCOUNTER — Telehealth: Payer: Self-pay | Admitting: Emergency Medicine

## 2024-03-10 ENCOUNTER — Ambulatory Visit

## 2024-03-10 VITALS — BP 167/81 | HR 82 | Temp 97.9°F | Resp 17

## 2024-03-10 DIAGNOSIS — J45909 Unspecified asthma, uncomplicated: Secondary | ICD-10-CM | POA: Diagnosis not present

## 2024-03-10 MED ORDER — PREDNISONE 20 MG PO TABS: 40.0000 mg | ORAL_TABLET | Freq: Every day | ORAL | 0 refills | Status: AC

## 2024-03-10 NOTE — ED Provider Notes (Signed)
 " [facility]    CSN: [id number] Arrival date & time: 03/10/24  1436      History   Chief Complaint Chief Complaint  Patient presents with   Cough   Shortness of Breath    HPI Kimberly [name] is a 74 y.o. female presenting with cough.  History of asthma, for which she is prescribed, albuterol  inhaler  States her lungs have been inflamed since Covid 2023. Pt c/o cough, sob for 3 months. Cough is nonproductive. Cough is worse at night.  States it has gotten worse in last 3 weeks. She is prescribed albuterol  inhaler which she is not using. She is using the albuterol -budesonide  inhaler twice weekly. Her pulmonologist is unaware of her current symptoms, but the patient verbalizes that she will call them today. Also with hoarse voice.  Requesting cough syrup.   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   SUI (stress urinary incontinence, female) 03/23/2021   Hiatal hernia 09/22/2020   GERD (gastroesophageal reflux disease) 09/22/2020   Diverticulosis 09/22/2020   Osteopenia 09/08/2020   Restless leg syndrome 06/05/2020   Hypothyroidism 03/29/2020   Essential hypertension 03/29/2020   Kidney stones 03/29/2020   Asthmatic bronchitis , chronic (HCC) 03/29/2020   Anxiety disorder 03/29/2020    Past Surgical History:  Procedure Laterality Date   ABDOMINAL HYSTERECTOMY  1980   ANTERIOR AND POSTERIOR VAGINAL REPAIR W/ SACROSPINOUS LIGAMENT SUSPENSION  06/29/2021   CHOLECYSTECTOMY Bilateral 11/25/2022   Procedure:  LAPAROSCOPIC CHOLECYSTECTOMY;  Surgeon: Paola Dreama SAILOR, MD;  Location: [facility] OR;  Service: General;  Laterality: Bilateral;   HERNIA REPAIR  2006?   PARTIAL HYSTERECTOMY  1980   Pre-cancerous condition. Failed laser surgery. still have ovaries   UMBILICAL HERNIA REPAIR  2004   with mesh   WRIST SURGERY Left 2019    OB History     Gravida  2   Para  2   Term      Preterm      AB      Living  2      SAB      IAB      Ectopic      Multiple      Live Births               Home Medications    Prior to Admission medications  Medication Sig Start Date End Date Taking? Authorizing Provider  predniSONE  (DELTASONE ) 20 MG tablet Take 2 tablets (40 mg total) by mouth daily for 5 days. 03/10/24 03/15/24 Yes Kennette Cuthrell E, PA-C  albuterol  (VENTOLIN  HFA) 108 (90 Base) MCG/ACT inhaler INHALE 2 PUFFS BY MOUTH EVERY 4 HOURS AS NEEDED 09/04/22   Thedora Garnette HERO, MD  Albuterol -Budesonide  (AIRSUPRA ) 90-80 MCG/ACT AERO Inhale 1-2 puffs into the lungs every 6 (six) hours as needed. 01/08/24   Kassie Acquanetta Bradley, MD  aspirin  81 MG chewable tablet Chew 81 mg by mouth  daily.    [provider]  benzonatate  (TESSALON ) 200 MG capsule Take 1 capsule (200 mg total) by mouth 3 (three) times daily as needed for cough. 02/05/24   Thedora Garnette HERO, MD  buPROPion  (WELLBUTRIN  SR) 150 MG 12 hr tablet TAKE 1 TABLET(150 MG) BY MOUTH TWICE DAILY 06/18/23   Thedora Garnette HERO, MD  chlorpheniramine-HYDROcodone  (TUSSIONEX) 10-8 MG/5ML Take 5 mLs by mouth every 12 (twelve) hours as needed for cough. 10/13/23   Thedora Garnette HERO, MD  cholecalciferol (VITAMIN D3) 25 MCG (1000 UNIT) tablet Take 1,000 Units by mouth daily.    [provider]  Cranberry 1000 MG CAPS Take 2 capsules by mouth daily.    [provider]  diazepam  (VALIUM ) 5 MG tablet Take 1/2 tablet by mouth in the morning and at bedtime. May take an additional dose midday as needed. 02/05/24   Thedora Garnette HERO, MD  famotidine (PEPCID) 20  MG tablet Take 20 mg by mouth 2 (two) times daily.    [provider]  HYDROcodone  bit-homatropine (HYCODAN) 5-1.5 MG/5ML syrup Take 5 mLs by mouth every 8 (eight) hours as needed for cough. 02/05/24   Thedora Garnette HERO, MD  ibuprofen  (ADVIL ) 600 MG tablet Take 600 mg by mouth every 6 (six) hours as needed. 04/01/23   [provider]  levothyroxine  (SYNTHROID ) 100 MCG tablet Take 1 tablet (100 mcg total) by mouth daily at 6 (six) AM. 10/10/23   Thedora Garnette HERO, MD  metoprolol  succinate (TOPROL -XL) 25 MG 24 hr tablet TAKE 1 TABLET(25 MG) BY MOUTH DAILY 12/03/23   Thedora Garnette HERO, MD  metroNIDAZOLE  (METROGEL ) 0.75 % gel Apply 1 Application topically 2 (two) times daily. 02/05/24   Thedora Garnette HERO, MD  nystatin  (MYCOSTATIN ) 100000 UNIT/ML suspension Take 5 mLs (500,000 Units total) by mouth 4 (four) times daily. 01/28/24   McElwee, Lauren A, NP  phentermine (ADIPEX-P) 37.5 MG tablet Take 37.5 mg by mouth daily. 01/07/24   [provider]  rOPINIRole  (REQUIP ) 1 MG tablet Take 1 tablet (1 mg total) by mouth at bedtime. 10/10/23   Thedora Garnette HERO, MD    Family History Family History  Problem Relation Age of Onset   Diabetes Mother    Breast cancer Mother    Colon cancer Father    Kidney failure Father    Kidney disease Father    COPD Brother    Diabetes Brother    Heart disease Brother    Heart disease Maternal Uncle    Heart disease Daughter    Depression Son    Esophageal cancer Neg Hx    Stomach cancer Neg Hx    Rectal cancer Neg Hx     Social History Social History[1]   Allergies   Codeine and Sulfa antibiotics   Review of Systems Review of Systems  Constitutional:  Negative for appetite change, chills and fever.  HENT:  Positive for congestion. Negative for ear pain, rhinorrhea, sinus pressure, sinus pain and sore throat.   Eyes:  Negative for redness and visual disturbance.  Respiratory:  Positive for cough and shortness of breath. Negative for chest  tightness and wheezing.   Cardiovascular:  Negative for chest pain and palpitations.  Gastrointestinal:  Negative for abdominal pain, constipation, diarrhea, nausea and vomiting.  Genitourinary:  Negative for dysuria, frequency and urgency.  Musculoskeletal:  Negative for myalgias.  Neurological:  Negative for dizziness, weakness and headaches.  Psychiatric/Behavioral:  Negative for confusion.   All other systems reviewed and are negative.  Physical Exam Triage Vital Signs ED Triage Vitals  Encounter Vitals Group     BP      Girls Systolic BP Percentile      Girls Diastolic BP Percentile      Boys Systolic BP Percentile      Boys Diastolic BP Percentile      Pulse      Resp      Temp      Temp src      SpO2      Weight      Height      Head Circumference      Peak Flow      Pain Score      Pain Loc      Pain Education      Exclude from Growth Chart    No data found.  Updated Vital Signs BP (!) 167/81 (BP Location: Left Arm)   Pulse 82   Temp 97.9 F (36.6 C) (Oral)   Resp 17   SpO2 94%   Visual Acuity Right Eye Distance:   Left Eye Distance:   Bilateral Distance:    Right Eye Near:   Left Eye Near:    Bilateral Near:     Physical Exam Vitals reviewed.  Constitutional:      General: She is not in acute distress.    Appearance: Normal appearance. She is not ill-appearing or diaphoretic.  HENT:     Head: Normocephalic and atraumatic.  Cardiovascular:     Rate and Rhythm: Normal rate and regular rhythm.     Heart sounds: Normal heart sounds.  Pulmonary:     Effort: Pulmonary effort is normal.     Breath sounds: Normal breath sounds. No decreased breath sounds, wheezing, rhonchi or rales.  Skin:    General: Skin is warm.  Neurological:     General: No focal deficit present.     Mental Status: She is alert and oriented to person, place, and time.  Psychiatric:        Mood and Affect: Mood normal.        Behavior: Behavior normal.        Thought  Content: Thought content normal.        Judgment: Judgment normal.      UC Treatments / Results  Labs (all labs ordered are listed, but only abnormal results are displayed) Labs Reviewed - No data to display  EKG   Radiology No results found.  Procedures Procedures (including critical care time)  Medications Ordered in UC Medications - No data to display  Initial Impression / Assessment and Plan / UC Course  I have reviewed the triage vital signs and the nursing notes.  Pertinent labs & imaging results that were available during my care of the patient were reviewed by me and considered in my medical decision making (see chart for details).     Patient is a pleasant 74 y.o. female presenting with asthma exacerbation.. The patient is afebrile and nontachycardic.  Antipyretic has not been administered today.  CXR: No active cardiopulmonary disease.  Cough for 3 months, getting worse.  On exam, lungs are clear to auscultation.  Prednisone  burst sent.  Continue inhalers as directed.  Despite duration of cough, the patient has not been seen by her pulmonologist for her symptoms.  The patient verbalizes that she will call her pulmonologist today to schedule a follow-up.  I provided her with pulmonologist's phone number.  Return precautions as below  -Coding Level  4 for acute exacerbation of chronic condition and prescription drug management.   Final Clinical Impressions(s) / UC Diagnoses   Final diagnoses:  Asthmatic bronchitis without complication, unspecified asthma severity, unspecified whether persistent     Discharge Instructions      -For asthma, start the Prednisone , 2 pills taken at the same time for 5 days in a row.  Try taking this earlier in the day as it can give you energy. Avoid NSAIDs like ibuprofen  and alleve while taking this medication as they can increase your risk of stomach upset and even GI bleeding when in combination with a steroid. You can continue  tylenol  (acetaminophen ) up to 1000mg  3x daily. -Continue your Airsupra  inhaler.  -Head to [facility] for your chest xray. I will only call if this is abnormal. -CALL YOUR PULMONOLOGIST TODAY TO SCHEDULE A FOLLOW-UP APPOINTMENT.  -Your cough should slowly get better instead of worse. If you develop a cough productive of dark or red sputum, new shortness of breath, new chest tightness, new fevers, etc - seek additional care     ED Prescriptions     Medication Sig Dispense Auth. Provider   predniSONE  (DELTASONE ) 20 MG tablet Take 2 tablets (40 mg total) by mouth daily for 5 days. 10 tablet Shan Valdes E, PA-C      PDMP not reviewed this encounter.     [1]  Social History Tobacco Use   Smoking status: Former    Current packs/day: 0.00    Types: Cigarettes    Quit date: 1995    Years since quitting: 31.0   Smokeless tobacco: Never  Vaping Use   Vaping status: Never Used  Substance Use Topics   Alcohol  use: Not Currently    Alcohol /week: 1.0 standard drink of alcohol     Types: 1 Cans of beer per week    Comment: 1 week   Drug use: Never     Arlyss Leita BRAVO, PA-C 03/10/24 1633  "

## 2024-03-10 NOTE — ED Triage Notes (Signed)
 Pt c/o cough, sob for 3 months. States it has gotten worse in last 3 weeks.

## 2024-03-10 NOTE — Discharge Instructions (Signed)
-  For asthma, start the Prednisone , 2 pills taken at the same time for 5 days in a row.  Try taking this earlier in the day as it can give you energy. Avoid NSAIDs like ibuprofen  and alleve while taking this medication as they can increase your risk of stomach upset and even GI bleeding when in combination with a steroid. You can continue tylenol  (acetaminophen ) up to 1000mg  3x daily. -Continue your Airsupra  inhaler.  -Head to [facility] for your chest xray. I will only call if this is abnormal. -CALL YOUR PULMONOLOGIST TODAY TO SCHEDULE A FOLLOW-UP APPOINTMENT.  -Your cough should slowly get better instead of worse. If you develop a cough productive of dark or red sputum, new shortness of breath, new chest tightness, new fevers, etc - seek additional care

## 2024-03-10 NOTE — Telephone Encounter (Signed)
 Called patient and updated he on chest x-ray results per provider Leita Molly, PA. Chest x-ray was normal. She does not have pneumonia. She should take the prednisone  prescribed, and continue home inhalers. She should call her pulmonologist for a follow-up appointment.

## 2024-03-11 ENCOUNTER — Ambulatory Visit: Payer: Self-pay

## 2024-03-11 ENCOUNTER — Ambulatory Visit: Admitting: Pulmonary Disease

## 2024-03-11 ENCOUNTER — Encounter: Payer: Self-pay | Admitting: Pulmonary Disease

## 2024-03-11 VITALS — BP 124/86 | HR 84 | Temp 98.2°F | Ht 61.0 in | Wt 147.0 lb

## 2024-03-11 DIAGNOSIS — R0602 Shortness of breath: Secondary | ICD-10-CM

## 2024-03-11 DIAGNOSIS — R052 Subacute cough: Secondary | ICD-10-CM

## 2024-03-11 DIAGNOSIS — J45901 Unspecified asthma with (acute) exacerbation: Secondary | ICD-10-CM | POA: Diagnosis not present

## 2024-03-11 DIAGNOSIS — Z87891 Personal history of nicotine dependence: Secondary | ICD-10-CM | POA: Diagnosis not present

## 2024-03-11 MED ORDER — AMOXICILLIN-POT CLAVULANATE 875-125 MG PO TABS: 1.0000 | ORAL_TABLET | Freq: Two times a day (BID) | ORAL | 0 refills | Status: DC

## 2024-03-11 MED ORDER — HYDROCOD POLI-CHLORPHE POLI ER 10-8 MG/5ML PO SUER: 10.0000 mL | Freq: Two times a day (BID) | ORAL | 0 refills | Status: AC | PRN

## 2024-03-11 NOTE — Patient Instructions (Addendum)
 Prescription for Augmentin  sent to pharmacy for you  Prescription for Tussionex sent to pharmacy for you as well  Follow-up in about 3 months  An appointment will be made for you in the office here as opposed to [facility]  Reassuring that your x-ray and viral studies from the urgent care is not showing any findings of concern

## 2024-03-11 NOTE — Progress Notes (Signed)
 "              Kimberly [name]    [id number]    Jun 14, 1950  Primary Care Physician:Rudd, Garnette HERO, MD  Referring Physician: Thedora Garnette HERO, MD [address] [place],  [place] [address]  Chief complaint:   Patient is being seen for shortness of breath, cough,  HPI:  Patient of Dr. Laymond who has been struggling with cough, shortness of breath with sputum production Denies any chest pains or chest discomfort  Symptoms ongoing for few weeks but getting much worse Not able to sleep at night because of persistent cough  She is on Airsupra  which she tries not to use on a daily basis  Did not tolerate dry powder inhalers previously  With worsening symptoms she did go to urgent care on 03/10/2024 - Chest x-ray performed during that visit was negative for an infiltrative process  She is experiencing some hoarseness in her voice  Past history of smoking, she does try to stay active  Outpatient Encounter Medications as of 03/11/2024  Medication Sig   albuterol  (VENTOLIN  HFA) 108 (90 Base) MCG/ACT inhaler INHALE 2 PUFFS BY MOUTH EVERY 4 HOURS AS NEEDED   Albuterol -Budesonide  (AIRSUPRA ) 90-80 MCG/ACT AERO Inhale 1-2 puffs into the lungs every 6 (six) hours as needed.   amoxicillin -clavulanate (AUGMENTIN ) 875-125 MG tablet Take 1 tablet by mouth 2 (two) times daily.   aspirin  81 MG chewable tablet Chew 81 mg by mouth daily.   benzonatate  (TESSALON ) 200 MG capsule Take 1 capsule (200 mg total) by mouth 3 (three) times daily as needed for cough.   buPROPion  (WELLBUTRIN  SR) 150 MG 12 hr tablet TAKE 1 TABLET(150 MG) BY MOUTH TWICE DAILY   chlorpheniramine-HYDROcodone  (TUSSIONEX) 10-8 MG/5ML Take 10 mLs by mouth every 12 (twelve) hours as needed for cough.   cholecalciferol (VITAMIN D3) 25 MCG (1000 UNIT) tablet Take 1,000 Units by mouth daily.   Cranberry 1000 MG CAPS Take 2 capsules by mouth daily.   diazepam  (VALIUM ) 5 MG tablet Take 1/2 tablet by mouth in the morning and at bedtime.  May take an additional dose midday as needed.   famotidine (PEPCID) 20 MG tablet Take 20 mg by mouth 2 (two) times daily.   HYDROcodone  bit-homatropine (HYCODAN) 5-1.5 MG/5ML syrup Take 5 mLs by mouth every 8 (eight) hours as needed for cough.   ibuprofen  (ADVIL ) 600 MG tablet Take 600 mg by mouth every 6 (six) hours as needed.   levothyroxine  (SYNTHROID ) 100 MCG tablet Take 1 tablet (100 mcg total) by mouth daily at 6 (six) AM.   metoprolol  succinate (TOPROL -XL) 25 MG 24 hr tablet TAKE 1 TABLET(25 MG) BY MOUTH DAILY   metroNIDAZOLE  (METROGEL ) 0.75 % gel Apply 1 Application topically 2 (two) times daily.   nystatin  (MYCOSTATIN ) 100000 UNIT/ML suspension Take 5 mLs (500,000 Units total) by mouth 4 (four) times daily.   phentermine (ADIPEX-P) 37.5 MG tablet Take 37.5 mg by mouth daily.   predniSONE  (DELTASONE ) 20 MG tablet Take 2 tablets (40 mg total) by mouth daily for 5 days.   rOPINIRole  (REQUIP ) 1 MG tablet Take 1 tablet (1 mg total) by mouth at bedtime.   [DISCONTINUED] chlorpheniramine-HYDROcodone  (TUSSIONEX) 10-8 MG/5ML Take 5 mLs by mouth every 12 (twelve) hours as needed for cough.   No facility-administered encounter medications on file as of 03/11/2024.    Allergies as of 03/11/2024 - Review Complete 03/11/2024  Allergen Reaction Noted   Codeine Hives 03/29/2020   Sulfa antibiotics Hives and Itching  03/29/2020    Past Medical History:  Diagnosis Date   Allergy 2020   Sulphar drugs hives   Anxiety 1979   Asthma    Barrett esophagus    Chronic headaches    Colon polyps    COVID 03/2020   Depression    GERD (gastroesophageal reflux disease) 20116   Hypertension    Kidney stones    Pneumonia    Thyroid  disease 1999   Uterine cancer (HCC) 1980    Past Surgical History:  Procedure Laterality Date   ABDOMINAL HYSTERECTOMY  1980   ANTERIOR AND POSTERIOR VAGINAL REPAIR W/ SACROSPINOUS LIGAMENT SUSPENSION  06/29/2021   CHOLECYSTECTOMY Bilateral 11/25/2022   Procedure:  LAPAROSCOPIC CHOLECYSTECTOMY;  Surgeon: Paola Dreama SAILOR, MD;  Location: [facility] OR;  Service: General;  Laterality: Bilateral;   HERNIA REPAIR  2006?   PARTIAL HYSTERECTOMY  1980   Pre-cancerous condition. Failed laser surgery. still have ovaries   UMBILICAL HERNIA REPAIR  2004   with mesh   WRIST SURGERY Left 2019    Family History  Problem Relation Age of Onset   Diabetes Mother    Breast cancer Mother    Colon cancer Father    Kidney failure Father    Kidney disease Father    COPD Brother    Diabetes Brother    Heart disease Brother    Heart disease Maternal Uncle    Heart disease Daughter    Depression Son    Esophageal cancer Neg Hx    Stomach cancer Neg Hx    Rectal cancer Neg Hx     Social History   Socioeconomic History   Marital status: Widowed    Spouse name: Richard   Number of children: 2   Years of education: 12 years   Highest education level: Associate degree: occupational, scientist, product/process development, or vocational program  Occupational History   Occupation: retired    Comment: Retired  Tobacco Use   Smoking status: Former    Current packs/day: 0.00    Types: Cigarettes    Quit date: 1995    Years since quitting: 31.0   Smokeless tobacco: Never  Vaping Use   Vaping status: Never Used  Substance and Sexual Activity   Alcohol  use: Not Currently    Alcohol /week: 1.0 standard drink of alcohol     Types: 1 Cans of beer per week    Comment: 1 week   Drug use: Never   Sexual activity: Not Currently    Birth control/protection: Surgical    Comment: Hyst  Other Topics Concern   Not on file  Social History Narrative   Recently moved from [place]  to [place]. 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   Social Drivers of Health   Tobacco Use: Medium Risk (03/11/2024)   Patient History    Smoking Tobacco Use: Former    Smokeless Tobacco Use: Never    Passive Exposure: Not on file  Financial Resource Strain: Low Risk (02/02/2024)   Overall Financial  Resource Strain (CARDIA)    Difficulty of Paying Living Expenses: Not hard at all  Food Insecurity: No Food Insecurity (02/02/2024)   Epic    Worried About Programme Researcher, Broadcasting/film/video in the Last Year: Never true    Ran Out of Food in the Last Year: Never true  Transportation Needs: No Transportation Needs (02/02/2024)   Epic    Lack of Transportation (Medical): No    Lack of Transportation (Non-Medical): No  Physical Activity: Sufficiently Active (02/02/2024)  Exercise Vital Sign    Days of Exercise per Week: 5 days    Minutes of Exercise per Session: 30 min  Stress: No Stress Concern Present (02/02/2024)   [employer] of Occupational Health - Occupational Stress Questionnaire    Feeling of Stress: Only a little  Social Connections: Moderately Integrated (02/02/2024)   Social Connection and Isolation Panel    Frequency of Communication with Friends and Family: Three times a week    Frequency of Social Gatherings with Friends and Family: Twice a week    Attends Religious Services: More than 4 times per year    Active Member of [employer] or Organizations: Yes    Attends Banker Meetings: More than 4 times per year    Marital Status: Widowed  Intimate Partner Violence: Not At Risk (03/17/2023)   Humiliation, Afraid, Rape, and Kick questionnaire    Fear of Current or Ex-Partner: No    Emotionally Abused: No    Physically Abused: No    Sexually Abused: No  Depression (PHQ2-9): Low Risk (03/17/2023)   Depression (PHQ2-9)    PHQ-2 Score: 0  Alcohol  Screen: Low Risk (02/02/2024)   Alcohol  Screen    Last Alcohol  Screening Score (AUDIT): 1  Housing: Unknown (02/02/2024)   Epic    Unable to Pay for Housing in the Last Year: No    Number of Times Moved in the Last Year: Not on file    Homeless in the Last Year: No  Utilities: Not At Risk (03/17/2023)   AHC Utilities    Threatened with loss of utilities: No  Health Literacy: Adequate Health Literacy (03/17/2023)   B1300 Health Literacy     Frequency of need for help with medical instructions: Never    Review of Systems  Respiratory:  Positive for cough and shortness of breath.     Vitals:   03/11/24 1334  BP: 124/86  Pulse: 84  Temp: 98.2 F (36.8 C)  SpO2: 97%     Physical Exam Constitutional:      Appearance: Normal appearance.  HENT:     Head: Normocephalic.     Nose: Nose normal.     Mouth/Throat:     Mouth: Mucous membranes are moist.  Eyes:     General: No scleral icterus.    Pupils: Pupils are equal, round, and reactive to light.  Cardiovascular:     Rate and Rhythm: Normal rate and regular rhythm.     Heart sounds: No murmur heard.    No friction rub.  Pulmonary:     Effort: No respiratory distress.     Breath sounds: No stridor. No wheezing or rhonchi.  Musculoskeletal:     Cervical back: No rigidity or tenderness.  Neurological:     Mental Status: She is alert.  Psychiatric:        Mood and Affect: Mood normal.    Data Reviewed: Chest x-ray 03/10/24 reviewed showing no acute infiltrate  Last visit with Dr. Kassie on 01/08/2024 was reviewed  Assessment/Plan: Asthma with exacerbation  Bronchitic exacerbation of asthma  Will call in a course of Augmentin  - She will pick up the prednisone  that she was prescribed as well  Prescription for Tussionex was sent to the pharmacy for her  Follow-up appointment in about 3 months  Encouraged to give us  a call if still not feeling better following above management plans  She lives close to the office here, she wonders whether she can follow-up in the [address] office compared  with going back to [facility]  Will be glad to see her back in about 3 months     Jennet Epley MD [facility] 03/11/2024, 1:50 PM  CC: Thedora Garnette HERO, MD   "

## 2024-03-11 NOTE — Telephone Encounter (Signed)
 FYI Only or Action Required?: Action required by provider: request for appointment.  Patient was last seen in primary care on 02/05/2024 by Kimberly Garnette HERO, MD.  Called Nurse Triage reporting Cough.  Symptoms began several days ago.  Interventions attempted: Prescription medications: Prednisone .  Symptoms are: unchanged. Seen in UC yesterday and told to follow up with Pulmonary. Productive cough, wheezing, SOB.  Triage Disposition: See HCP Within 4 Hours (Or PCP Triage)  Patient/caregiver understands and will follow disposition?: Yes     Copied from CRM #[id number]. Topic: Clinical - Red Word Triage >> Mar 11, 2024 10:50 AM Kimberly [name] wrote: Red Word that prompted transfer to Nurse Triage: severe productive cough with yellowish green phlegm, SOB, trouble speaking due to dyspnea, wheezing - pt was seen yesterday at urgent care and got [name] CXR that ruled out pneumonia and broken ribs Reason for Disposition  [1] MILD difficulty breathing (e.g., minimal/no SOB at rest, SOB with walking, pulse < 100) AND [2] still present when not coughing  Answer Assessment - Initial Assessment Questions 1. ONSET: When did the cough begin?      3 months 2. SEVERITY: How bad is the cough today?      severe 3. SPUTUM: Describe the color of your sputum (e.g., none, dry cough; clear, white, yellow, green)     yellow 4. HEMOPTYSIS: Are you coughing up any blood? If Yes, ask: How much? (e.g., flecks, streaks, tablespoons, etc.)     no 5. DIFFICULTY BREATHING: Are you having difficulty breathing? If Yes, ask: How bad is it? (e.g., mild, moderate, severe)      yes 6. FEVER: Do you have [name] fever? If Yes, ask: What is your temperature, how was it measured, and when did it start?     no 7. CARDIAC HISTORY: Do you have any history of heart disease? (e.g., heart attack, congestive heart failure)      no 8. LUNG HISTORY: Do you have any history of lung disease?  (e.g., pulmonary embolus, asthma,  emphysema)     asthma 9. PE RISK FACTORS: Do you have [name] history of blood clots? (or: recent major surgery, recent prolonged travel, bedridden)     no 10. OTHER SYMPTOMS: Do you have any other symptoms? (e.g., runny nose, wheezing, chest pain)       wheezing 11. PREGNANCY: Is there any chance you are pregnant? When was your last menstrual period?       no 12. TRAVEL: Have you traveled out of the country in the last month? (e.g., travel history, exposures)       no  Protocols used: Cough - Acute Productive-[name]-AH

## 2024-03-12 NOTE — Telephone Encounter (Signed)
 Patient was seen in office.NFN

## 2024-03-22 ENCOUNTER — Ambulatory Visit: Payer: PPO

## 2024-03-22 DIAGNOSIS — Z0000 Encounter for general adult medical examination without abnormal findings: Secondary | ICD-10-CM | POA: Diagnosis not present

## 2024-03-22 NOTE — Patient Instructions (Signed)
 Ms. Roell,  Thank you for taking the time for your Medicare Wellness Visit. I appreciate your continued commitment to your health goals. Please review the care plan we discussed, and feel free to reach out if I can assist you further.  Please note that Annual Wellness Visits do not include a physical exam. Some assessments may be limited, especially if the visit was conducted virtually. If needed, we may recommend an in-person follow-up with your provider.  Ongoing Care Seeing your primary care provider every 3 to 6 months helps us  monitor your health and provide consistent, personalized care.   Referrals If a referral was made during today's visit and you haven't received any updates within two weeks, please contact the referred provider directly to check on the status.  Recommended Screenings:  Health Maintenance  Topic Date Due   COVID-19 Vaccine (4 - 2025-26 season) 11/03/2023   Zoster (Shingles) Vaccine (2 of 2) 02/23/2024   Medicare Annual Wellness Visit  03/16/2024   Breast Cancer Screening  05/18/2024   Flu Shot  06/01/2024*   Pneumococcal Vaccine for age over 37 (1 of 2 - PCV) 01/07/2025*   Colon Cancer Screening  09/22/2025   DTaP/Tdap/Td vaccine (2 - Tdap) 03/04/2026   Osteoporosis screening with Bone Density Scan  Completed   Hepatitis C Screening  Completed   Meningitis B Vaccine  Aged Out  *Topic was postponed. The date shown is not the original due date.       03/21/2024    4:54 PM  Advanced Directives  Does Patient Have a Medical Advance Directive? Yes  Type of Estate Agent of [place];Living will  Copy of Healthcare Power of Attorney in Chart? No - copy requested    Vision: Annual vision screenings are recommended for early detection of glaucoma, cataracts, and diabetic retinopathy. These exams can also reveal signs of chronic conditions such as diabetes and high blood pressure.  Dental: Annual dental screenings help detect early signs  of oral cancer, gum disease, and other conditions linked to overall health, including heart disease and diabetes.  Please see the attached documents for additional preventive care recommendations.

## 2024-03-22 NOTE — Progress Notes (Signed)
 "  Chief Complaint  Patient presents with   Medicare Wellness     Subjective:   Kimberly [name] is a 74 y.o. female who presents for a Medicare Annual Wellness Visit.  Visit info / Clinical Intake: Medicare Wellness Visit Type:: Subsequent Annual Wellness Visit Persons participating in visit and providing information:: patient Medicare Wellness Visit Mode:: Video Since this visit was completed virtually, some vitals may be partially provided or unavailable. Missing vitals are due to the limitations of the virtual format.: Unable to obtain vitals - no equipment If Telephone or Video please confirm:: I connected with patient using audio/video enable telemedicine. I verified patient identity with two identifiers, discussed telehealth limitations, and patient agreed to proceed. Patient Location:: home Provider Location:: office Interpreter Needed?: No Pre-visit prep was completed: yes AWV questionnaire completed by patient prior to visit?: yes Date:: 03/21/24 Living arrangements:: (!) (Patient-Rptd) lives alone Patient's Overall Health Status Rating: (Patient-Rptd) good Typical amount of pain: (Patient-Rptd) none Does pain affect daily life?: (Patient-Rptd) no Are you currently prescribed opioids?: no  Dietary Habits and Nutritional Risks How many meals a day?: (Patient-Rptd) 2 Eats fruit and vegetables daily?: (!) (Patient-Rptd) no Most meals are obtained by: (Patient-Rptd) preparing own meals In the last 2 weeks, have you had any of the following?: none Diabetic:: no  Functional Status Activities of Daily Living (to include ambulation/medication): (Patient-Rptd) Independent Ambulation: (Patient-Rptd) Independent Medication Administration: (Patient-Rptd) Independent Home Management (perform basic housework or laundry): (Patient-Rptd) Independent Manage your own finances?: (Patient-Rptd) yes Primary transportation is: (Patient-Rptd) driving Concerns about vision?: no *vision  screening is required for WTM* Concerns about hearing?: no  Fall Screening Falls in the past year?: 1 (got tripped up) Number of falls in past year: (Patient-Rptd) 1 Was there an injury with Fall?: 1 (black eye) Fall Risk Category Calculator: (Patient-Rptd) 3 Patient Fall Risk Level: (Patient-Rptd) High Fall Risk  Fall Risk Patient at Risk for Falls Due to: Medication side effect Fall risk Follow up: Education provided; Falls evaluation completed; Falls prevention discussed  Home and Transportation Safety: All rugs have non-skid backing?: (Patient-Rptd) yes All stairs or steps have railings?: (Patient-Rptd) yes Grab bars in the bathtub or shower?: (Patient-Rptd) yes Have non-skid surface in bathtub or shower?: (Patient-Rptd) yes Good home lighting?: (Patient-Rptd) yes Regular seat belt use?: (Patient-Rptd) yes Hospital stays in the last year:: (Patient-Rptd) no  Cognitive Assessment Difficulty concentrating, remembering, or making decisions? : (Patient-Rptd) no Will 6CIT or Mini Cog be Completed: yes What year is it?: 0 points What month is it?: 0 points Give patient an address phrase to remember (5 components): [address] [place] About what time is it?: 0 points Count backwards from 20 to 1: 0 points Say the months of the year in reverse: 0 points Repeat the address phrase from earlier: 0 points 6 CIT Score: 0 points  Advance Directives (For Healthcare) Does Patient Have a Medical Advance Directive?: Yes Type of Advance Directive: Healthcare Power of [place]; Living will Copy of Healthcare Power of Attorney in Chart?: No - copy requested Copy of Living Will in Chart?: No - copy requested  Reviewed/Updated  Reviewed/Updated: Reviewed All (Medical, Surgical, Family, Medications, Allergies, Care Teams, Patient Goals)    Allergies (verified) Codeine and Sulfa antibiotics   Current Medications (verified) Outpatient Encounter Medications as of 03/22/2024  Medication  Sig   albuterol  (VENTOLIN  HFA) 108 (90 Base) MCG/ACT inhaler INHALE 2 PUFFS BY MOUTH EVERY 4 HOURS AS NEEDED   Albuterol -Budesonide  (AIRSUPRA ) 90-80 MCG/ACT AERO Inhale 1-2  puffs into the lungs every 6 (six) hours as needed.   aspirin  81 MG chewable tablet Chew 81 mg by mouth daily.   benzonatate  (TESSALON ) 200 MG capsule Take 1 capsule (200 mg total) by mouth 3 (three) times daily as needed for cough.   buPROPion  (WELLBUTRIN  SR) 150 MG 12 hr tablet TAKE 1 TABLET(150 MG) BY MOUTH TWICE DAILY   chlorpheniramine-HYDROcodone  (TUSSIONEX) 10-8 MG/5ML Take 10 mLs by mouth every 12 (twelve) hours as needed for cough.   cholecalciferol (VITAMIN D3) 25 MCG (1000 UNIT) tablet Take 1,000 Units by mouth daily.   famotidine (PEPCID) 20 MG tablet Take 20 mg by mouth 2 (two) times daily.   HYDROcodone  bit-homatropine (HYCODAN) 5-1.5 MG/5ML syrup Take 5 mLs by mouth every 8 (eight) hours as needed for cough.   levothyroxine  (SYNTHROID ) 100 MCG tablet Take 1 tablet (100 mcg total) by mouth daily at 6 (six) AM.   metoprolol  succinate (TOPROL -XL) 25 MG 24 hr tablet TAKE 1 TABLET(25 MG) BY MOUTH DAILY   metroNIDAZOLE  (METROGEL ) 0.75 % gel Apply 1 Application topically 2 (two) times daily.   rOPINIRole  (REQUIP ) 1 MG tablet Take 1 tablet (1 mg total) by mouth at bedtime.   amoxicillin -clavulanate (AUGMENTIN ) 875-125 MG tablet Take 1 tablet by mouth 2 (two) times daily. (Patient not taking: Reported on 03/22/2024)   Cranberry 1000 MG CAPS Take 2 capsules by mouth daily. (Patient not taking: Reported on 03/22/2024)   diazepam  (VALIUM ) 5 MG tablet Take 1/2 tablet by mouth in the morning and at bedtime. May take an additional dose midday as needed. (Patient not taking: Reported on 03/22/2024)   ibuprofen  (ADVIL ) 600 MG tablet Take 600 mg by mouth every 6 (six) hours as needed. (Patient not taking: Reported on 03/22/2024)   nystatin  (MYCOSTATIN ) 100000 UNIT/ML suspension Take 5 mLs (500,000 Units total) by mouth 4 (four) times  daily. (Patient not taking: Reported on 03/22/2024)   phentermine (ADIPEX-P) 37.5 MG tablet Take 37.5 mg by mouth daily. (Patient not taking: Reported on 03/22/2024)   No facility-administered encounter medications on file as of 03/22/2024.    History: Past Medical History:  Diagnosis Date   Allergy 2020   Sulphar drugs hives   Anxiety 1979   Asthma    Barrett esophagus    Chronic headaches    Colon polyps    COVID 03/2020   Depression    GERD (gastroesophageal reflux disease) 20116   Hypertension    Kidney stones    Pneumonia    Thyroid  disease 1999   Uterine cancer (HCC) 1980   Past Surgical History:  Procedure Laterality Date   ABDOMINAL HYSTERECTOMY  1980   ANTERIOR AND POSTERIOR VAGINAL REPAIR W/ SACROSPINOUS LIGAMENT SUSPENSION  06/29/2021   CHOLECYSTECTOMY Bilateral 11/25/2022   Procedure: LAPAROSCOPIC CHOLECYSTECTOMY;  Surgeon: Paola Dreama SAILOR, MD;  Location: [facility] OR;  Service: General;  Laterality: Bilateral;   HERNIA REPAIR  2006?   PARTIAL HYSTERECTOMY  1980   Pre-cancerous condition. Failed laser surgery. still have ovaries   UMBILICAL HERNIA REPAIR  2004   with mesh   WRIST SURGERY Left 2019   Family History  Problem Relation Age of Onset   Diabetes Mother    Breast cancer Mother    Colon cancer Father    Kidney failure Father    Kidney disease Father    COPD Brother    Diabetes Brother    Heart disease Brother    Heart disease Maternal Uncle    Heart disease Daughter  Depression Son    Esophageal cancer Neg Hx    Stomach cancer Neg Hx    Rectal cancer Neg Hx    Social History   Occupational History   Occupation: retired    Comment: Retired  Tobacco Use   Smoking status: Former    Current packs/day: 0.00    Types: Cigarettes    Quit date: 1995    Years since quitting: 31.0   Smokeless tobacco: Never  Vaping Use   Vaping status: Never Used  Substance and Sexual Activity   Alcohol  use: Yes    Alcohol /week: 1.0 standard drink of alcohol      Types: 1 Glasses of wine per week    Comment: Casual drink for dinner party   Drug use: Never   Sexual activity: Not Currently    Birth control/protection: None    Comment: Hyst   Tobacco Counseling Counseling given: Not Answered  SDOH Screenings   Food Insecurity: No Food Insecurity (03/22/2024)  Housing: Unknown (03/22/2024)  Transportation Needs: No Transportation Needs (03/22/2024)  Utilities: Not At Risk (03/22/2024)  Alcohol  Screen: Low Risk (03/22/2024)  Depression (PHQ2-9): Low Risk (03/22/2024)  Financial Resource Strain: Low Risk (03/22/2024)  Physical Activity: Sufficiently Active (03/22/2024)  Social Connections: Moderately Isolated (03/22/2024)  Stress: No Stress Concern Present (03/22/2024)  Tobacco Use: Medium Risk (03/22/2024)  Health Literacy: Adequate Health Literacy (03/22/2024)   See flowsheets for full screening details  Depression Screen PHQ 2 & 9 Depression Scale- Over the past 2 weeks, how often have you been bothered by any of the following problems? Little interest or pleasure in doing things: 0 Feeling down, depressed, or hopeless (PHQ Adolescent also includes...irritable): 0 PHQ-2 Total Score: 0     Goals Addressed               This Visit's Progress     not to fall (pt-stated)               Objective:    Today's Vitals   There is no height or weight on file to calculate BMI.  Hearing/Vision screen Vision Screening - Comments:: Regular eye exams, Dr. Leslee Immunizations and Health Maintenance Health Maintenance  Topic Date Due   COVID-19 Vaccine (4 - 2025-26 season) 11/03/2023   Zoster Vaccines- Shingrix (2 of 2) 02/23/2024   Mammogram  05/18/2024   Influenza Vaccine  06/01/2024 (Originally 10/03/2023)   Pneumococcal Vaccine: 50+ Years (1 of 2 - PCV) 01/07/2025 (Originally 11/11/1969)   Medicare Annual Wellness (AWV)  03/22/2025   Colonoscopy  09/22/2025   DTaP/Tdap/Td (2 - Tdap) 03/04/2026   Bone Density Scan  Completed    Hepatitis C Screening  Completed   Meningococcal B Vaccine  Aged Out        Assessment/Plan:  This is a routine wellness examination for Kimberly [name].  Patient Care Team: Thedora Garnette HERO, MD as PCP - General (Family Medicine) Cathlyn JAYSON Cary, Bobie BRAVO, MD as Consulting Physician (Obstetrics and Gynecology) Abran Norleen SAILOR, MD as Consulting Physician (Gastroenterology) Alvia Dorothyann LABOR, MD as Referring Physician (Gynecology) Kassie Acquanetta Bradley, MD as Consulting Physician (Pulmonary Disease) Neda Jennet LABOR, MD as Consulting Physician (Pulmonary Disease) Leslee Reusing, MD as Consulting Physician (Ophthalmology)  I have personally reviewed and noted the following in the patients chart:   Medical and social history Use of alcohol , tobacco or illicit drugs  Current medications and supplements including opioid prescriptions. Functional ability and status Nutritional status Physical activity Advanced directives List of other physicians Hospitalizations, surgeries, and  ER visits in previous 12 months Vitals Screenings to include cognitive, depression, and falls Referrals and appointments  No orders of the defined types were placed in this encounter.  In addition, I have reviewed and discussed with patient certain preventive protocols, quality metrics, and best practice recommendations. A written personalized care plan for preventive services as well as general preventive health recommendations were provided to patient.   Ardella FORBES Dawn, LPN   8/80/7973   Return in 1 year (on 03/22/2025).  After Visit Summary: (MyChart) Due to this being a telephonic visit, the after visit summary with patients personalized plan was offered to patient via MyChart   Nurse Notes: HM Addressed: Vaccines Due: Second shingles vaccine Decline covid vaccine  "

## 2024-03-26 ENCOUNTER — Encounter: Payer: Self-pay | Admitting: Family Medicine

## 2024-03-26 NOTE — Telephone Encounter (Signed)
 Called and spoke to patient and scheduled her for 04/05/24. Dm/cma

## 2024-04-02 ENCOUNTER — Encounter (HOSPITAL_COMMUNITY): Payer: Self-pay

## 2024-04-02 ENCOUNTER — Emergency Department (HOSPITAL_COMMUNITY)

## 2024-04-02 ENCOUNTER — Emergency Department (HOSPITAL_COMMUNITY)
Admission: EM | Admit: 2024-04-02 | Discharge: 2024-04-02 | Disposition: A | Discharge status: Home / Self Care | Attending: Emergency Medicine | Admitting: Emergency Medicine

## 2024-04-02 ENCOUNTER — Other Ambulatory Visit: Payer: Self-pay

## 2024-04-02 DIAGNOSIS — S32019A Unspecified fracture of first lumbar vertebra, initial encounter for closed fracture: Secondary | ICD-10-CM

## 2024-04-02 DIAGNOSIS — S0083XA Contusion of other part of head, initial encounter: Secondary | ICD-10-CM | POA: Insufficient documentation

## 2024-04-02 DIAGNOSIS — W000XXA Fall on same level due to ice and snow, initial encounter: Secondary | ICD-10-CM | POA: Insufficient documentation

## 2024-04-02 DIAGNOSIS — Y92009 Unspecified place in unspecified non-institutional (private) residence as the place of occurrence of the external cause: Secondary | ICD-10-CM | POA: Diagnosis not present

## 2024-04-02 DIAGNOSIS — W19XXXA Unspecified fall, initial encounter: Secondary | ICD-10-CM

## 2024-04-02 DIAGNOSIS — M549 Dorsalgia, unspecified: Secondary | ICD-10-CM | POA: Diagnosis present

## 2024-04-02 DIAGNOSIS — R109 Unspecified abdominal pain: Secondary | ICD-10-CM | POA: Insufficient documentation

## 2024-04-02 DIAGNOSIS — S32018A Other fracture of first lumbar vertebra, initial encounter for closed fracture: Secondary | ICD-10-CM | POA: Diagnosis not present

## 2024-04-02 DIAGNOSIS — R079 Chest pain, unspecified: Secondary | ICD-10-CM | POA: Insufficient documentation

## 2024-04-02 LAB — COMPREHENSIVE METABOLIC PANEL WITH GFR
ALT: 15 U/L (ref 0–44)
AST: 24 U/L (ref 15–41)
Albumin: 4.3 g/dL (ref 3.5–5.0)
Alkaline Phosphatase: 73 U/L (ref 38–126)
Anion gap: 7 (ref 5–15)
BUN: 20 mg/dL (ref 8–23)
CO2: 33 mmol/L — ABNORMAL HIGH (ref 22–32)
Calcium: 9.9 mg/dL (ref 8.9–10.3)
Chloride: 98 mmol/L (ref 98–111)
Creatinine, Ser: 0.54 mg/dL (ref 0.44–1.00)
GFR, Estimated: >60 mL/min
Glucose, Bld: 123 mg/dL — ABNORMAL HIGH (ref 70–99)
Potassium: 4.4 mmol/L (ref 3.5–5.1)
Sodium: 138 mmol/L (ref 135–145)
Total Bilirubin: 0.7 mg/dL (ref 0.0–1.2)
Total Protein: 7.3 g/dL (ref 6.5–8.1)

## 2024-04-02 LAB — CBC WITH DIFFERENTIAL/PLATELET
Abs Immature Granulocytes: 0.03 10*3/uL (ref 0.00–0.07)
Basophils Absolute: 0 10*3/uL (ref 0.0–0.1)
Basophils Relative: 0 %
Eosinophils Absolute: 0.1 10*3/uL (ref 0.0–0.5)
Eosinophils Relative: 2 %
HCT: 36 % (ref 36.0–46.0)
Hemoglobin: 11.9 g/dL — ABNORMAL LOW (ref 12.0–15.0)
Immature Granulocytes: 1 %
Lymphocytes Relative: 11 %
Lymphs Abs: 0.7 10*3/uL (ref 0.7–4.0)
MCH: 28.1 pg (ref 26.0–34.0)
MCHC: 33.1 g/dL (ref 30.0–36.0)
MCV: 84.9 fL (ref 80.0–100.0)
Monocytes Absolute: 0.6 10*3/uL (ref 0.1–1.0)
Monocytes Relative: 10 %
Neutro Abs: 4.7 10*3/uL (ref 1.7–7.7)
Neutrophils Relative %: 76 %
Platelets: 138 10*3/uL — ABNORMAL LOW (ref 150–400)
RBC: 4.24 MIL/uL (ref 3.87–5.11)
RDW: 12.9 % (ref 11.5–15.5)
WBC: 6.1 10*3/uL (ref 4.0–10.5)
nRBC: 0 % (ref 0.0–0.2)

## 2024-04-02 MED ORDER — IOHEXOL 300 MG/ML  SOLN
80.0000 mL | Freq: Once | INTRAMUSCULAR | Status: AC | PRN
  Administered 2024-04-02: 80 mL via INTRAVENOUS

## 2024-04-02 MED ORDER — OXYCODONE HCL 5 MG PO TABS: 5.0000 mg | ORAL_TABLET | Freq: Four times a day (QID) | ORAL | 0 refills | Status: AC | PRN

## 2024-04-02 MED ORDER — OXYCODONE HCL 5 MG PO TABS
5.0000 mg | ORAL_TABLET | Freq: Once | ORAL | Status: AC
  Administered 2024-04-02: 5 mg via ORAL
  Filled 2024-04-02: qty 1

## 2024-04-02 MED ORDER — ACETAMINOPHEN 500 MG PO TABS
1000.0000 mg | ORAL_TABLET | Freq: Once | ORAL | Status: AC
  Administered 2024-04-02: 1000 mg via ORAL
  Filled 2024-04-02: qty 2

## 2024-04-02 NOTE — ED Triage Notes (Signed)
 BIBA from home w/ c/o of fall on ice at home around 1000 am.  Pt states she only fell once on back, but has ecchymosis to left cheek.  PT lives alone, per EMS, no evidence of assistive devices.  PT recently diagnosed with Pneumonia and on antibiotics.  PT refused CBG

## 2024-04-02 NOTE — ED Provider Notes (Signed)
 " [name] AT [name] Provider Note   CSN: [id number] Arrival date & time: 04/02/24  0255     History Chief Complaint  Patient presents with   Fall    BIBA from home w/ c/o of fall on ice at home around 1000 am.  Pt states she only fell once on back, but has ecchymosis to left cheek.  PT lives alone, per EMS, no evidence of assistive devices.  PT recently diagnosed with Pneumonia and on antibiotics.  PT refused CBG     HPI Kimberly [name] [name] is a 74 y.o. female presenting for chief complaint of fall at home. She states she heard someone sneaking around her house so she called EMS. They did not find anyone but recommended she come to the ER to get checked out after falling today at 1000 on her wood floors falling face first into a door jam but bounced off and hit her mid back on the open door. She had called her PCP and has an appointment for later this week. But came in now because the EMS team told her it might have been a stroke that made her fall this morning.   Patient's recorded medical, surgical, social, medication list and allergies were reviewed in the Snapshot window as part of the initial history.   Review of Systems   Review of Systems  Constitutional:  Negative for chills and fever.  HENT:  Negative for ear pain and sore throat.   Eyes:  Negative for pain and visual disturbance.  Respiratory:  Negative for cough and shortness of breath.   Cardiovascular:  Negative for chest pain and palpitations.  Gastrointestinal:  Negative for abdominal pain and vomiting.  Genitourinary:  Negative for dysuria and hematuria.  Musculoskeletal:  Positive for back pain. Negative for arthralgias.  Skin:  Negative for color change and rash.  Neurological:  Negative for seizures and syncope.  All other systems reviewed and are negative.   Physical Exam Updated Vital Signs BP (!) 156/95   Pulse 89   Temp 97.8 F (36.6 C) (Oral)   Resp 19   Ht 5' 1  (1.549 m)   Wt 66.7 kg   SpO2 96%   BMI 27.78 kg/m  Physical Exam Vitals and nursing note reviewed.  Constitutional:      General: She is not in acute distress.    Appearance: She is well-developed.  HENT:     Head: Normocephalic and atraumatic.  Eyes:     Conjunctiva/sclera: Conjunctivae normal.  Cardiovascular:     Rate and Rhythm: Normal rate and regular rhythm.     Heart sounds: No murmur heard. Pulmonary:     Effort: Pulmonary effort is normal. No respiratory distress.     Breath sounds: Normal breath sounds.  Abdominal:     General: There is no distension.     Palpations: Abdomen is soft.     Tenderness: There is no abdominal tenderness. There is no right CVA tenderness or left CVA tenderness.  Musculoskeletal:        General: Signs of injury (bruising on the face) present. No swelling or tenderness. Normal range of motion.     Cervical back: Neck supple.  Skin:    General: Skin is warm and dry.  Neurological:     General: No focal deficit present.     Mental Status: She is alert and oriented to person, place, and time. Mental status is at baseline.     Cranial Nerves:  No cranial nerve deficit.      ED Course/ Medical Decision Making/ A&P    Procedures Procedures   Medications Ordered in ED Medications  oxyCODONE  (Oxy IR/ROXICODONE ) immediate release tablet 5 mg (has no administration in time range)  acetaminophen  (TYLENOL ) tablet 1,000 mg (1,000 mg Oral Given 04/02/24 0442)  iohexol  (OMNIPAQUE ) 300 MG/ML solution 80 mL (80 mLs Intravenous Contrast Given 04/02/24 0538)   Medical Decision Making:    Kimberly [name] [name] is a 74 y.o. female who presented to the ED today with a high mechanisma trauma, detailed above.    Patient placed on continuous vitals and telemetry monitoring while in ED which was reviewed periodically.   Given this mechanism of trauma, a full physical exam was performed.   Reviewed and confirmed nursing documentation for past medical history,  family history, social history.    Initial Assessment/Plan:   This is a patient presenting with a high mechanism trauma.  As such, I have considered intracranial injuries including intracranial hemorrhage, intrathoracic injuries including blunt myocardial or blunt lung injury, blunt abdominal injuries including aortic dissection, bladder injury, spleen injury, liver injury and I have considered orthopedic injuries including extremity or spinal injury.  With the patient's presentation of high mechanism trauma and abnormalities detailed above, patient warrants aggressive evaluation for potential traumatic injuries. Does not currently meet criteria for level trauma activation per departmental policy, though continuous reassessments will be initiated per protocol. Will proceed with non-level trauma protocol to evaluate for potential injuries. Will proceed with CT Head, Cervical/Thoracic/Lumbar Spine, and Chest/Abdomen/Pelvis with contrast.   Final Reassessment and Plan:   CT scan showed single superior endplate fracture.  Discussed with patient.  She stated she would never want back surgery and will not wear a back brace. She has no neurologic functions.  We discussed progression of these fractures without appropriate care and patient expressed understanding but states she just wants the pain under control and that she would follow-up with her normal back provider in the outpatient setting. Denies fevers chills nausea vomiting shortness of breath.  Discussed that this fracture could limit mobility but patient states that she feels comfortable ambulating at home as long as her pain is under control and would like to be discharged to outpatient care management.  Strict return precautions reinforced.   Clinical Impression:  1. Fall, initial encounter      Discharge   Final Clinical Impression(s) / ED Diagnoses Final diagnoses:  Fall, initial encounter    Rx / DC Orders ED Discharge Orders           Ordered    oxyCODONE  (ROXICODONE ) 5 MG immediate release tablet  Every 6 hours PRN        04/02/24 0651              Kimberly Meth, MD 04/02/24 9347  "

## 2024-04-02 NOTE — ED Notes (Signed)
Pt waiting on ride at this time.  

## 2024-04-02 NOTE — ED Notes (Signed)
"  Pt ambulated to the bathroom with assistance.   "

## 2024-04-02 NOTE — ED Notes (Signed)
 Paged Neurosurgery Dr. Darnella.

## 2024-04-03 ENCOUNTER — Encounter: Payer: Self-pay | Admitting: Family Medicine

## 2024-04-05 ENCOUNTER — Ambulatory Visit: Admitting: Family Medicine

## 2024-04-07 ENCOUNTER — Encounter: Payer: Self-pay | Admitting: Family Medicine

## 2024-04-07 ENCOUNTER — Telehealth: Admitting: Family Medicine

## 2024-04-07 VITALS — Ht 61.0 in | Wt 145.0 lb

## 2024-04-07 DIAGNOSIS — S32010A Wedge compression fracture of first lumbar vertebra, initial encounter for closed fracture: Secondary | ICD-10-CM | POA: Insufficient documentation

## 2024-04-07 DIAGNOSIS — S0083XD Contusion of other part of head, subsequent encounter: Secondary | ICD-10-CM

## 2024-04-07 DIAGNOSIS — S32010D Wedge compression fracture of first lumbar vertebra, subsequent encounter for fracture with routine healing: Secondary | ICD-10-CM

## 2024-04-07 DIAGNOSIS — G250 Essential tremor: Secondary | ICD-10-CM

## 2024-04-07 DIAGNOSIS — W19XXXD Unspecified fall, subsequent encounter: Secondary | ICD-10-CM

## 2024-04-07 MED ORDER — CALCITONIN (SALMON) 200 UNIT/ACT NA SOLN: 1.0000 | Freq: Every day | NASAL | 0 refills | Status: AC

## 2024-04-07 MED ORDER — DIAZEPAM 5 MG PO TABS: ORAL_TABLET | ORAL | 0 refills | Status: AC

## 2024-04-07 NOTE — Assessment & Plan Note (Signed)
 [name] has been using diazepam  sparingly. Her last fill of 30 tabs was 60 days ago. She should keep this to 5 mg 1/2 tab (2.5 mg) 2-3 times a day as needed. She understands not combining this with opioids.
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
## 2024-04-07 NOTE — Assessment & Plan Note (Signed)
 Acute compression fracture with x-ray evidence of prior fractures. Likely osteoporotic in nature. I recommend adding calcitonin nasal spray to current regimen to reduce pain.Continue judicious use of oxycodone . Discussed us  looking at adding alendronate therapy to regimen in a month at her follow-up.

## 2024-05-17 ENCOUNTER — Ambulatory Visit: Admitting: Family Medicine

## 2024-06-08 ENCOUNTER — Ambulatory Visit: Admitting: Pulmonary Disease

## 2025-03-28 ENCOUNTER — Ambulatory Visit
